# Patient Record
Sex: Male | Born: 1950 | Race: White | Hispanic: No | Marital: Married | Smoking: Never smoker
Health system: Southern US, Community
[De-identification: ages and names within clinical notes are randomized; demographics above are authoritative.]

## PROBLEM LIST (undated history)

## (undated) DIAGNOSIS — I82629 Acute embolism and thrombosis of deep veins of unspecified upper extremity: Secondary | ICD-10-CM

## (undated) DIAGNOSIS — I219 Acute myocardial infarction, unspecified: Secondary | ICD-10-CM

## (undated) DIAGNOSIS — I10 Essential (primary) hypertension: Secondary | ICD-10-CM

## (undated) DIAGNOSIS — Z950 Presence of cardiac pacemaker: Secondary | ICD-10-CM

## (undated) DIAGNOSIS — J189 Pneumonia, unspecified organism: Secondary | ICD-10-CM

## (undated) DIAGNOSIS — I639 Cerebral infarction, unspecified: Secondary | ICD-10-CM

## (undated) DIAGNOSIS — N189 Chronic kidney disease, unspecified: Secondary | ICD-10-CM

## (undated) DIAGNOSIS — I2510 Atherosclerotic heart disease of native coronary artery without angina pectoris: Secondary | ICD-10-CM

---

## 2012-05-19 DIAGNOSIS — Z1211 Encounter for screening for malignant neoplasm of colon: Secondary | ICD-10-CM | POA: Insufficient documentation

## 2013-05-11 DIAGNOSIS — I10 Essential (primary) hypertension: Secondary | ICD-10-CM | POA: Insufficient documentation

## 2013-05-11 DIAGNOSIS — E785 Hyperlipidemia, unspecified: Secondary | ICD-10-CM | POA: Insufficient documentation

## 2013-05-11 DIAGNOSIS — E1169 Type 2 diabetes mellitus with other specified complication: Secondary | ICD-10-CM | POA: Insufficient documentation

## 2013-05-11 DIAGNOSIS — R079 Chest pain, unspecified: Secondary | ICD-10-CM | POA: Insufficient documentation

## 2013-06-10 DIAGNOSIS — I341 Nonrheumatic mitral (valve) prolapse: Secondary | ICD-10-CM

## 2013-06-10 HISTORY — DX: Nonrheumatic mitral (valve) prolapse: I34.1

## 2015-05-10 DIAGNOSIS — M542 Cervicalgia: Secondary | ICD-10-CM | POA: Insufficient documentation

## 2015-05-10 DIAGNOSIS — M5412 Radiculopathy, cervical region: Secondary | ICD-10-CM | POA: Insufficient documentation

## 2015-05-10 DIAGNOSIS — G9520 Unspecified cord compression: Secondary | ICD-10-CM

## 2015-05-10 DIAGNOSIS — M5410 Radiculopathy, site unspecified: Secondary | ICD-10-CM | POA: Insufficient documentation

## 2015-05-10 DIAGNOSIS — R52 Pain, unspecified: Secondary | ICD-10-CM | POA: Insufficient documentation

## 2015-05-10 HISTORY — DX: Unspecified cord compression: G95.20

## 2015-05-10 HISTORY — DX: Radiculopathy, cervical region: M54.12

## 2015-05-12 DIAGNOSIS — G540 Brachial plexus disorders: Secondary | ICD-10-CM | POA: Insufficient documentation

## 2015-05-14 DIAGNOSIS — I495 Sick sinus syndrome: Secondary | ICD-10-CM | POA: Insufficient documentation

## 2015-05-14 DIAGNOSIS — G540 Brachial plexus disorders: Secondary | ICD-10-CM

## 2015-05-14 DIAGNOSIS — R001 Bradycardia, unspecified: Secondary | ICD-10-CM | POA: Insufficient documentation

## 2015-05-14 HISTORY — DX: Brachial plexus disorders: G54.0

## 2015-05-18 DIAGNOSIS — E1140 Type 2 diabetes mellitus with diabetic neuropathy, unspecified: Secondary | ICD-10-CM | POA: Insufficient documentation

## 2015-07-03 DIAGNOSIS — M7741 Metatarsalgia, right foot: Secondary | ICD-10-CM | POA: Insufficient documentation

## 2015-07-03 HISTORY — DX: Metatarsalgia, right foot: M77.41

## 2015-07-15 ENCOUNTER — Encounter (HOSPITAL_BASED_OUTPATIENT_CLINIC_OR_DEPARTMENT_OTHER): Payer: Self-pay | Admitting: Emergency Medicine

## 2015-07-15 ENCOUNTER — Emergency Department (HOSPITAL_BASED_OUTPATIENT_CLINIC_OR_DEPARTMENT_OTHER): Payer: Medicare HMO

## 2015-07-15 ENCOUNTER — Emergency Department (HOSPITAL_BASED_OUTPATIENT_CLINIC_OR_DEPARTMENT_OTHER)
Admission: EM | Admit: 2015-07-15 | Discharge: 2015-07-16 | Disposition: A | Discharge status: Home / Self Care | Payer: Medicare HMO | Attending: Emergency Medicine | Admitting: Emergency Medicine

## 2015-07-15 DIAGNOSIS — J40 Bronchitis, not specified as acute or chronic: Secondary | ICD-10-CM | POA: Diagnosis not present

## 2015-07-15 DIAGNOSIS — Z880 Allergy status to penicillin: Secondary | ICD-10-CM | POA: Insufficient documentation

## 2015-07-15 DIAGNOSIS — Z8701 Personal history of pneumonia (recurrent): Secondary | ICD-10-CM | POA: Insufficient documentation

## 2015-07-15 DIAGNOSIS — R0602 Shortness of breath: Secondary | ICD-10-CM | POA: Diagnosis present

## 2015-07-15 DIAGNOSIS — R079 Chest pain, unspecified: Secondary | ICD-10-CM | POA: Diagnosis not present

## 2015-07-15 HISTORY — DX: Pneumonia, unspecified organism: J18.9

## 2015-07-15 LAB — BASIC METABOLIC PANEL
Anion gap: 7 (ref 5–15)
BUN: 15 mg/dL (ref 6–20)
CO2: 25 mmol/L (ref 22–32)
CREATININE: 0.8 mg/dL (ref 0.61–1.24)
Calcium: 8.8 mg/dL — ABNORMAL LOW (ref 8.9–10.3)
Chloride: 104 mmol/L (ref 101–111)
Glucose, Bld: 187 mg/dL — ABNORMAL HIGH (ref 65–99)
POTASSIUM: 3.3 mmol/L — AB (ref 3.5–5.1)
Sodium: 136 mmol/L (ref 135–145)

## 2015-07-15 LAB — CBC
HEMATOCRIT: 36.4 % — AB (ref 39.0–52.0)
HEMOGLOBIN: 11.8 g/dL — AB (ref 13.0–17.0)
MCH: 27.9 pg (ref 26.0–34.0)
MCHC: 32.4 g/dL (ref 30.0–36.0)
MCV: 86.1 fL (ref 78.0–100.0)
Platelets: 197 10*3/uL (ref 150–400)
RBC: 4.23 MIL/uL (ref 4.22–5.81)
RDW: 13.6 % (ref 11.5–15.5)
WBC: 8.5 10*3/uL (ref 4.0–10.5)

## 2015-07-15 NOTE — ED Provider Notes (Signed)
CSN: [id number]     Arrival date & time 07/15/15  2037 History  By signing my name below, I, Terrance Branch, attest that this documentation has been prepared under the direction and in the presence of Charlesetta Shanks, MD. Electronically Signed: Randa Evens, ED Scribe. 07/15/2015. 11:50 PM.      Chief Complaint  Patient presents with  . Shortness of Breath   HPI HPI Comments: Jason Jarvis is a 64 y.o. male who presents to the Emergency Department complaining of worsening cough onset 2 days prior. No sputum production. No fever. Pt states that he has associated CP and SOB. Chest pain is at the left lower chest wall and made worse by cough. No lower extremity swelling or calf pain.  Pt reports Hx of pneumonia and reports that he had similar symptoms. Reports ICU admit for 5 days for bradycardia. Pt doesn't report any alleviating or worsening factors. Pt doesn't report any other symptoms. Pt denies Hx of tobacco use.    Past Medical History  Diagnosis Date  . Pneumonia    History reviewed. No pertinent past surgical history. History reviewed. No pertinent family history. Social History  Substance Use Topics  . Smoking status: Never Smoker   . Smokeless tobacco: None  . Alcohol Use: No    Review of Systems 10 Systems reviewed and all are negative for acute change except as noted in the HPI.   Allergies  Ace inhibitors; Penicillins; Prednisone; and Sulfa antibiotics  Home Medications   Prior to Admission medications   Not on File   BP 154/75 mmHg  Pulse 60  Temp(Src) 99.4 F (37.4 C) (Oral)  Resp 18  Ht 5\' 9"  (1.753 m)  Wt 210 lb (95.255 kg)  BMI 31.00 kg/m2  SpO2 96%   Physical Exam  Constitutional: He is oriented to person, place, and time. He appears well-developed and well-nourished.  HENT:  Head: Normocephalic and atraumatic.  Eyes: EOM are normal. Pupils are equal, round, and reactive to light.  Neck: Neck supple.  Cardiovascular: Normal rate, regular  rhythm, normal heart sounds and intact distal pulses.   Pulmonary/Chest: Effort normal and breath sounds normal. He exhibits tenderness.  Patient endorses tenderness to palpation along the left chest wall. There is no crepitus, no rash, no soft tissue abnormality.  Abdominal: Soft. Bowel sounds are normal. He exhibits no distension. There is no tenderness.  Musculoskeletal: Normal range of motion. He exhibits no edema.  Neurological: He is alert and oriented to person, place, and time. He has normal strength. Coordination normal. GCS eye subscore is 4. GCS verbal subscore is 5. GCS motor subscore is 6.  Skin: Skin is warm, dry and intact.  Psychiatric: He has a normal mood and affect.    ED Course  Procedures (including critical care time) DIAGNOSTIC STUDIES: Oxygen Saturation is 100% on RA, normal by my interpretation.    COORDINATION OF CARE: 10:11 PM-Discussed treatment plan with pt at bedside and pt agreed to plan.     Labs Review Labs Reviewed  BASIC METABOLIC PANEL - Abnormal; Notable for the following:    Potassium 3.3 (*)    Glucose, Bld 187 (*)    Calcium 8.8 (*)    All other components within normal limits  CBC - Abnormal; Notable for the following:    Hemoglobin 11.8 (*)    HCT 36.4 (*)    All other components within normal limits    Imaging Review Dg Chest 2 View  07/15/2015  CLINICAL DATA:  Cough and shortness of breath for a few days. EXAM: CHEST  2 VIEW COMPARISON:  None. FINDINGS: Lung volumes are low. Heart size is normal for technique. Mild tortuosity of the thoracic aorta. No consolidation to suggest pneumonia. Minimal left basilar atelectasis. Probable calcified granuloma projecting over the right upper lung. No pleural effusion or pneumothorax. No acute osseous abnormalities are seen. IMPRESSION: Low lung volumes with mild left basilar atelectasis. No consolidation to suggest pneumonia. Electronically Signed   By: Jeb Levering M.D.   On: 07/15/2015 21:33       EKG Interpretation   Date/Time:  Sunday July 15 2015 21:59:38 EST Ventricular Rate:  54 PR Interval:  190 QRS Duration: 106 QT Interval:  436 QTC Calculation: 413 R Axis:   -56 Text Interpretation:  Sinus bradycardia Incomplete right bundle branch  block Left anterior fascicular block Minimal voltage criteria for LVH, may  be normal variant Nonspecific T wave abnormality Abnormal ECG Confirmed by  Johnney Killian, MD, Jeannie Done [phone]) on 07/15/2015 10:14:01 PM      MDM   Final diagnoses:  Bronchitis   Patient present with concerns for pneumonia due to a prior episode of pneumonia and hospitalization in June. At this time there do not appear to be focal pneumonia symptoms. The patient is afebrile and does not have leukocytosis. Is no local consolidation on chest x-ray. I was able to review the EMR from [facility] and the patient had a essentially normal cardiac catheterization in May. He has as well an echo with normal EF. At this time, with prior diagnostic evaluations, the patient is of low probability to have acute ischemic or congestive heart failure as the etiology of his symptoms. Consideration is given to pneumonia, however without concrete findings, I did not feel that empiracally starting the patient antibiotics was appropriate. Particularly as the patient had a significant problem with bradycardia during his hospitalization. His wife reports ultimately they thought this was due to his amlodipine.       Charlesetta Shanks, MD 07/15/15 [phone]

## 2015-07-15 NOTE — ED Notes (Signed)
Patient states that he is having a lot of SOB and cough. Reports history of PNA in June. Patient reports that he is having  Hard time catching his breath.

## 2015-07-15 NOTE — Discharge Instructions (Signed)

## 2015-08-21 DIAGNOSIS — B351 Tinea unguium: Secondary | ICD-10-CM | POA: Insufficient documentation

## 2015-08-21 DIAGNOSIS — M7742 Metatarsalgia, left foot: Secondary | ICD-10-CM | POA: Insufficient documentation

## 2015-08-21 HISTORY — DX: Metatarsalgia, left foot: M77.42

## 2015-11-01 ENCOUNTER — Other Ambulatory Visit: Payer: Self-pay | Admitting: Urology

## 2015-11-01 DIAGNOSIS — N2889 Other specified disorders of kidney and ureter: Secondary | ICD-10-CM

## 2015-11-08 ENCOUNTER — Ambulatory Visit
Admission: RE | Admit: 2015-11-08 | Discharge: 2015-11-08 | Disposition: A | Discharge status: Home / Self Care | Payer: Medicare HMO | Source: Ambulatory Visit | Attending: Urology | Admitting: Urology

## 2015-11-08 DIAGNOSIS — N2889 Other specified disorders of kidney and ureter: Secondary | ICD-10-CM

## 2015-11-08 HISTORY — DX: Cerebral infarction, unspecified: I63.9

## 2015-11-08 HISTORY — DX: Atherosclerotic heart disease of native coronary artery without angina pectoris: I25.10

## 2015-11-08 HISTORY — DX: Chronic kidney disease, unspecified: N18.9

## 2015-11-08 HISTORY — DX: Acute myocardial infarction, unspecified: I21.9

## 2015-11-08 HISTORY — DX: Essential (primary) hypertension: I10

## 2015-11-08 NOTE — Consult Note (Signed)
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 Assess for ablation.  Referring Physician(s): Eskew,Lawrence A   History of Present Illness: Jason Jarvis is a 65 y.o. male who presents for evaluation of a right renal mass incidentally found. He remains asymptomatic. No current abdominal pain or flank pain. No gross hematuria. Lesion was found on a lumbar spine MRI for back pain. This is followed by an ultrasound which demonstrated a solid lesion. This prompted a CT scan which confirms a solid enhancing renal mass compatible with a renal cell carcinoma. He presents for outpatient evaluation for percutaneous image guided cryoablation. He has multiple comorbidities including hypertension, mild chronic renal insufficiency, remote stroke and heart attack. Known coronary disease.  Past Medical History  Diagnosis Date  . Pneumonia   . Hypertension   . Chronic kidney disease   . Stroke ([place])     approx 15 yrs ago  . Myocardial infarction [facility])     pt states that he had a "light heart attack about 25 yrs ago"  . Coronary artery disease     No past surgical history on file.  Allergies: Ace inhibitors; Penicillins; Prednisone; and Sulfa antibiotics  Medications: Prior to Admission medications   Medication Sig Start Date End Date Taking? Authorizing Provider  aspirin EC 81 MG tablet Take by mouth daily.   Yes Historical Provider, MD  atorvastatin (LIPITOR) 40 MG tablet Take 40 mg by mouth daily.   Yes Historical Provider, MD  carisoprodol (SOMA) 350 MG tablet Take 350 mg by mouth 4 (four) times daily as needed for muscle spasms.   Yes Historical Provider, MD  doxazosin (CARDURA) 2 MG tablet Take 2 mg by mouth at bedtime.   Yes Historical Provider, MD  furosemide (LASIX) 80 MG tablet Take 80 mg by mouth. Take 1 tablet every AM. Take 1/2 tablet every PM   Yes Historical Provider, MD  gabapentin (NEURONTIN) 600 MG tablet Take 600 mg by mouth 3 (three) times daily.   Yes Historical  Provider, MD  hydrOXYzine (ATARAX/VISTARIL) 10 MG tablet Take 10 mg by mouth 3 (three) times daily as needed.   Yes Historical Provider, MD  insulin aspart (NOVOLOG) 100 UNIT/ML injection Inject 15 Units into the skin every evening.   Yes Historical Provider, MD  Insulin Aspart Prot & Aspart (NOVOLOG MIX 70/30 [place]) Inject 30 Units into the skin every morning.   Yes Historical Provider, MD  IRON PO Take 65 mg by mouth daily.   Yes Historical Provider, MD  losartan (COZAAR) 100 MG tablet Take 100 mg by mouth daily.   Yes Historical Provider, MD  Multiple Vitamins-Minerals (CENTRUM SILVER ADULT 50+ PO) Take 1 tablet by mouth daily.   Yes Historical Provider, MD  nitroGLYCERIN (NITROSTAT) 0.4 MG SL tablet Place 0.4 mg under the tongue every 5 (five) minutes as needed for chest pain.   Yes Historical Provider, MD  potassium chloride (K-DUR) 10 MEQ tablet Take 10 mEq by mouth daily.   Yes Historical Provider, MD     No family history on file.  Social History   Social History  . Marital Status: Married    Spouse Name: N/A  . Number of Children: N/A  . Years of Education: N/A   Social History Main Topics  . Smoking status: Never Smoker   . Smokeless tobacco: Never Used  . Alcohol Use: No  . Drug Use: No  . Sexual Activity: Not on file   Other Topics Concern  . Not  on file   Social History Narrative    ECOG Status: 0 - Asymptomatic  Review of Systems: A 12 point ROS discussed and pertinent positives are indicated in the HPI above.  All other systems are negative.  Review of Systems  Constitutional: Negative for fever, diaphoresis, activity change, appetite change, fatigue and unexpected weight change.  Respiratory: Negative for cough, chest tightness and shortness of breath.   Cardiovascular: Negative for chest pain.  Gastrointestinal: Negative for abdominal pain and abdominal distention.  Genitourinary: Negative for hematuria and flank pain.    Vital Signs: BP 160/67 mmHg   Pulse 50  Temp(Src) 98.2 F (36.8 C) (Oral)  Resp 14  Ht 5\' 9"  (1.753 m)  Wt 218 lb (98.884 kg)  BMI 32.18 kg/m2  SpO2 99%  Physical Exam  Constitutional: He appears well-developed and well-nourished. No distress.  Cardiovascular: Normal rate and regular rhythm.   No murmur heard. Pulmonary/Chest: Effort normal and breath sounds normal. No respiratory distress. He has no wheezes.  Abdominal: Soft. Bowel sounds are normal. He exhibits no distension and no mass.  Protuberant obese abdomen.  Skin: He is not diaphoretic.    Mallampati Score: 2  Imaging: CT imaging without and with contrast performed at the [facility] physicians urology office demonstrates a 2.4 cm right mid pole posterior enhancing renal mass compatible with a renal cell carcinoma. No evidence of renal vein invasion or metastatic process. No adenopathy.  Labs:  CBC:  Recent Labs  07/15/15 2125  WBC 8.5  HGB 11.8*  HCT 36.4*  PLT 197    COAGS: No results for input(s): INR, APTT in the last 8760 hours.  BMP:  Recent Labs  07/15/15 2125  NA 136  K 3.3*  CL 104  CO2 25  GLUCOSE 187*  BUN 15  CALCIUM 8.8*  CREATININE 0.80  GFRNONAA >60  GFRAA >60    LIVER FUNCTION TESTS: No results for input(s): BILITOT, AST, ALT, ALKPHOS, PROT, ALBUMIN in the last 8760 hours.  TUMOR MARKERS: No results for input(s): AFPTM, CEA, CA199, CHROMGRNA in the last 8760 hours.  Assessment and Plan:  2.4 cm posterior right renal cell carcinoma. Lesion size and location is amenable to percutaneous CT-guided cryoablation under general anesthesia. The procedure was discussed in detail with the patient and his family member. They have a clear understanding of the cryoablation procedure, risks, benefits and alternatives. After our discussion and answering his questions, he would like to proceed with scheduling the procedure electively at [facility].   Prior to the procedure he will need cardiac  clearance. This will be scheduled as an outpatient.  Thank you for this interesting consult.  I greatly enjoyed meeting [place] and look forward to participating in their care.  A copy of this report was sent to the requesting provider on this date.  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

## 2015-12-07 ENCOUNTER — Other Ambulatory Visit: Payer: Self-pay | Admitting: Physician Assistant

## 2015-12-07 DIAGNOSIS — C641 Malignant neoplasm of right kidney, except renal pelvis: Secondary | ICD-10-CM

## 2015-12-11 ENCOUNTER — Other Ambulatory Visit: Payer: Self-pay | Admitting: *Deleted

## 2015-12-11 DIAGNOSIS — C641 Malignant neoplasm of right kidney, except renal pelvis: Secondary | ICD-10-CM

## 2016-01-02 ENCOUNTER — Ambulatory Visit
Admission: RE | Admit: 2016-01-02 | Discharge: 2016-01-02 | Disposition: A | Discharge status: Home / Self Care | Payer: Medicare HMO | Source: Ambulatory Visit | Attending: Physician Assistant | Admitting: Physician Assistant

## 2016-01-02 DIAGNOSIS — C641 Malignant neoplasm of right kidney, except renal pelvis: Secondary | ICD-10-CM

## 2016-01-02 NOTE — Progress Notes (Signed)
Patient ID: Jason Jarvis, male   DOB: May 06, 1951, 65 y.o.   MRN: [id number]       Chief Complaint:  1 month status post right renal cell carcinoma ablation.  Referring Physician(s): Eskew  History of Present Illness: Jason Jarvis is a 65 y.o. 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 This was performed at [facility] hospital 12/06/2015. He has recovered at home over the last month very well. No current flank or abdominal pain. No hematuria. His lesion was found incidentally on a lumbar spine MRI for other symptoms. Overall he has done very well and is back to his usual state of health. No physical limitations. He continues to work.  Past Medical History  Diagnosis Date  . Pneumonia   . Hypertension   . Chronic kidney disease   . Stroke ([place])     approx 15 yrs ago  . Myocardial infarction [facility])     pt states that he had a "light heart attack about 25 yrs ago"  . Coronary artery disease     No past surgical history on file.  Allergies: Ace inhibitors; Penicillins; Prednisone; and Sulfa antibiotics  Medications: Prior to Admission medications   Medication Sig Start Date End Date Taking? Authorizing Provider  aspirin EC 81 MG tablet Take by mouth daily.   Yes Historical Provider, MD  atorvastatin (LIPITOR) 40 MG tablet Take 40 mg by mouth daily.   Yes Historical Provider, MD  doxazosin (CARDURA) 2 MG tablet Take 2 mg by mouth at bedtime.   Yes Historical Provider, MD  furosemide (LASIX) 80 MG tablet Take 80 mg by mouth. Take 1 tablet every AM. Take 1/2 tablet every PM   Yes Historical Provider, MD  gabapentin (NEURONTIN) 600 MG tablet Take 600 mg by mouth 3 (three) times daily.   Yes Historical Provider, MD  hydrOXYzine (ATARAX/VISTARIL) 10 MG tablet Take 10 mg by mouth 3 (three) times daily as needed.   Yes Historical Provider, MD  insulin aspart (NOVOLOG) 100 UNIT/ML injection Inject 15 Units into the skin every evening.    Yes Historical Provider, MD  Insulin Aspart Prot & Aspart (NOVOLOG MIX 70/30 [place]) Inject 30 Units into the skin every morning.   Yes Historical Provider, MD  IRON PO Take 65 mg by mouth daily.   Yes Historical Provider, MD  losartan (COZAAR) 100 MG tablet Take 100 mg by mouth daily.   Yes Historical Provider, MD  Multiple Vitamins-Minerals (CENTRUM SILVER ADULT 50+ PO) Take 1 tablet by mouth daily.   Yes Historical Provider, MD  nitroGLYCERIN (NITROSTAT) 0.4 MG SL tablet Place 0.4 mg under the tongue every 5 (five) minutes as needed for chest pain.   Yes Historical Provider, MD  potassium chloride (K-DUR) 10 MEQ tablet Take 10 mEq by mouth daily.   Yes Historical Provider, MD  carisoprodol (SOMA) 350 MG tablet Take 350 mg by mouth 4 (four) times daily as needed for muscle spasms. Reported on 01/02/2016    Historical Provider, MD     No family history on file.  Social History   Social History  . Marital Status: Married    Spouse Name: N/A  . Number of Children: N/A  . Years of Education: N/A   Social History Main Topics  . Smoking status: Never Smoker   . Smokeless tobacco: Never Used  . Alcohol Use: No  . Drug Use: No  . Sexual Activity: Not on file  Other Topics Concern  . Not on file   Social History Narrative    ECOG Status: 0 - Asymptomatic  Review of Systems: A 12 point ROS discussed and pertinent positives are indicated in the HPI above.  All other systems are negative.  Review of Systems  Vital Signs: BP 140/61 mmHg  Pulse 59  Temp(Src) 98.5 F (36.9 C) (Oral)  Resp 15  SpO2 98%  Physical Exam  Constitutional: He appears well-developed and well-nourished. No distress.  Cardiovascular: Normal rate and regular rhythm.  Exam reveals no friction rub.   No murmur heard. Pulmonary/Chest: Effort normal and breath sounds normal. No respiratory distress.  Abdominal: Soft. Bowel sounds are normal. He exhibits no distension and no mass. There is no tenderness. No  hernia.  Skin: Skin is warm and dry. No rash noted. He is not diaphoretic. No erythema.  Right flank puncture sites are well-healed.  Psychiatric: He has a normal mood and affect. His behavior is normal.    Imaging: No results found.  Labs:  CBC:  Recent Labs  07/15/15 2125  WBC 8.5  HGB 11.8*  HCT 36.4*  PLT 197    COAGS: No results for input(s): INR, APTT in the last 8760 hours.  BMP:  Recent Labs  07/15/15 2125  NA 136  K 3.3*  CL 104  CO2 25  GLUCOSE 187*  BUN 15  CALCIUM 8.8*  CREATININE 0.80  GFRNONAA >60  GFRAA >60    LIVER FUNCTION TESTS: No results for input(s): BILITOT, AST, ALT, ALKPHOS, PROT, ALBUMIN in the last 8760 hours.  TUMOR MARKERS: No results for input(s): AFPTM, CEA, CA199, CHROMGRNA in the last 8760 hours.  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 Procedure performed at [facility] hospital 12/06/2015. He has recovered at home very well. No flank or abdominal pain. No hematuria or difficulty urinating. Overall he is doing very well. No physical limitations. He continues to work.  Plan: Schedule for post ablation surveillance CT imaging in 3 months at [facility] urology outpatient center. I will see him back in the office to review the imaging.  Electronically Signed: Greggory Keen 01/02/2016, 3:25 PM   I spent a total of    15 Minutes in face to face in clinical consultation, greater than 50% of which was counseling/coordinating care for This patient status post renal cell carcinoma cryoablation.

## 2016-02-25 DIAGNOSIS — L03039 Cellulitis of unspecified toe: Secondary | ICD-10-CM

## 2016-02-25 HISTORY — DX: Cellulitis of unspecified toe: L03.039

## 2016-03-03 DIAGNOSIS — E1142 Type 2 diabetes mellitus with diabetic polyneuropathy: Secondary | ICD-10-CM | POA: Insufficient documentation

## 2016-03-03 DIAGNOSIS — L84 Corns and callosities: Secondary | ICD-10-CM | POA: Insufficient documentation

## 2016-03-26 ENCOUNTER — Other Ambulatory Visit: Payer: Self-pay | Admitting: *Deleted

## 2016-03-26 DIAGNOSIS — C641 Malignant neoplasm of right kidney, except renal pelvis: Secondary | ICD-10-CM

## 2016-03-28 ENCOUNTER — Other Ambulatory Visit: Payer: Self-pay | Admitting: *Deleted

## 2016-03-28 DIAGNOSIS — C641 Malignant neoplasm of right kidney, except renal pelvis: Secondary | ICD-10-CM

## 2016-03-31 DIAGNOSIS — N2889 Other specified disorders of kidney and ureter: Secondary | ICD-10-CM | POA: Insufficient documentation

## 2016-03-31 HISTORY — DX: Other specified disorders of kidney and ureter: N28.89

## 2016-04-02 ENCOUNTER — Other Ambulatory Visit: Payer: Self-pay | Admitting: Interventional Radiology

## 2016-04-02 DIAGNOSIS — C641 Malignant neoplasm of right kidney, except renal pelvis: Secondary | ICD-10-CM

## 2016-04-22 ENCOUNTER — Ambulatory Visit
Admission: RE | Admit: 2016-04-22 | Discharge: 2016-04-22 | Disposition: A | Discharge status: Home / Self Care | Payer: Medicare HMO | Source: Ambulatory Visit | Attending: Interventional Radiology | Admitting: Interventional Radiology

## 2016-04-22 DIAGNOSIS — C641 Malignant neoplasm of right kidney, except renal pelvis: Secondary | ICD-10-CM

## 2016-04-22 DIAGNOSIS — E7800 Pure hypercholesterolemia, unspecified: Secondary | ICD-10-CM

## 2016-04-22 HISTORY — PX: IR GENERIC HISTORICAL: IMG1180011

## 2016-04-22 HISTORY — DX: Pure hypercholesterolemia, unspecified: E78.00

## 2016-04-22 NOTE — Progress Notes (Signed)
Patient states he has been having anorexia, nausea, vomiting and diarrhea for the past month.  Dr. Annamaria Boots aware.  Brita Romp, RN

## 2016-04-22 NOTE — Progress Notes (Signed)
Patient ID: Jason Jarvis, male   DOB: [birth year], 65 y.o.   MRN: [id number]       Chief Complaint: Right renal cell carcinoma, status post cryoablation, outpatient follow-up  Referring Physician(s): Eskew  History of Present Illness: Jason Jarvis is a 65 y.o. 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 Procedure performed 12/06/2015 at [facility].  he has recovered very well. 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 No hematuria. Overall he is doing very well. He is back to his baseline. No current physical limitations. Stable weight. He returns to review surveillance imaging.  Past Medical History:  Diagnosis Date  . Chronic kidney disease   . Coronary artery disease   . Hypertension   . Myocardial infarction [facility])    pt states that he had a "light heart attack about 25 yrs ago"  . Pneumonia   . Stroke ([place])    approx 15 yrs ago    No past surgical history on file.  Allergies: Cephalexin; Lorazepam; Penicillins; Ace inhibitors; Prednisone; and Sulfa antibiotics  Medications: Prior to Admission medications   Medication Sig Start Date End Date Taking? Authorizing Provider  amLODipine (NORVASC) 2.5 MG tablet  01/23/16  Yes Historical Provider, MD  aspirin EC 81 MG tablet Take by mouth daily.   Yes Historical Provider, MD  atorvastatin (LIPITOR) 40 MG tablet Take 40 mg by mouth daily.   Yes Historical Provider, MD  carisoprodol (SOMA) 350 MG tablet Take 350 mg by mouth 4 (four) times daily as needed for muscle spasms. Reported on 01/02/2016   Yes Historical Provider, MD  ciclopirox (LOPROX) 0.77 % cream  11/20/15  Yes Historical Provider, MD  doxazosin (CARDURA) 2 MG tablet Take 2 mg by mouth at bedtime.   Yes Historical Provider, MD  furosemide (LASIX) 80 MG tablet Take 80 mg by mouth. Take 1 tablet every AM. Take 1/2 tablet every PM   Yes Historical Provider, MD  gabapentin  (NEURONTIN) 600 MG tablet Take 600 mg by mouth 3 (three) times daily.   Yes Historical Provider, MD  hydrOXYzine (ATARAX/VISTARIL) 10 MG tablet Take 10 mg by mouth 3 (three) times daily as needed.   Yes Historical Provider, MD  insulin aspart (NOVOLOG) 100 UNIT/ML injection Inject 15 Units into the skin every evening.   Yes Historical Provider, MD  Insulin Aspart Prot & Aspart (NOVOLOG MIX 70/30 [place]) Inject 30 Units into the skin every morning.   Yes Historical Provider, MD  IRON PO Take 65 mg by mouth daily.   Yes Historical Provider, MD  losartan (COZAAR) 100 MG tablet Take 100 mg by mouth daily.   Yes Historical Provider, MD  Multiple Vitamins-Minerals (CENTRUM SILVER ADULT 50+ PO) Take 1 tablet by mouth daily.   Yes Historical Provider, MD  nitroGLYCERIN (NITROSTAT) 0.4 MG SL tablet Place 0.4 mg under the tongue every 5 (five) minutes as needed for chest pain.   Yes Historical Provider, MD  potassium chloride (K-DUR) 10 MEQ tablet Take 10 mEq by mouth daily.   Yes Historical Provider, MD     No family history on file.  Social History   Social History  . Marital status: Married    Spouse name: N/A  . Number of children: N/A  . Years of education: N/A   Social History Main Topics  . Smoking status: Never Smoker  . Smokeless tobacco: Never Used  . Alcohol use No  .  Drug use: No  . Sexual activity: Not Asked   Other Topics Concern  . None   Social History Narrative  . None    ECOG Status: 1 - Symptomatic but completely ambulatory  Review of Systems: A 12 point ROS discussed and pertinent positives are indicated in the HPI above.  All other systems are negative.  Review of Systems  Vital Signs: BP 137/62 (BP Location: Right Arm, Patient Position: Sitting, Cuff Size: Normal)   Pulse 64   Temp 98 F (36.7 C)   Resp 18   Ht 5\' 9"  (1.753 m)   Wt 217 lb 3.2 oz (98.5 kg) Comment: with bib overalls and shoes on  SpO2 97%   BMI 32.07 kg/m   Physical Exam  Constitutional:  He is oriented to person, place, and time. He appears well-developed and well-nourished.  Eyes: Conjunctivae are normal. No scleral icterus.  Cardiovascular: Normal rate and regular rhythm.   No murmur heard. Pulmonary/Chest: Effort normal. No respiratory distress.  Abdominal: Soft. Bowel sounds are normal. He exhibits no distension and no mass. There is no guarding. No hernia.  Neurological: He is alert and oriented to person, place, and time.  Skin: Skin is warm and dry. No rash noted. He is not diaphoretic.  Right flank incision sites well-healed.  Psychiatric: He has a normal mood and affect. His behavior is normal.   Imaging: Surveillance imaging performed in [facility] 04/15/2016. This demonstrates a nonenhancing right renal ablation defect following the treatment. No abnormal enhancement to suggest residual or viable tumor at the treatment site. No delay complication.  Labs:  CBC:  Recent Labs  07/15/15 2125  WBC 8.5  HGB 11.8*  HCT 36.4*  PLT 197    COAGS: No results for input(s): INR, APTT in the last 8760 hours.  BMP:  Recent Labs  07/15/15 2125  NA 136  K 3.3*  CL 104  CO2 25  GLUCOSE 187*  BUN 15  CALCIUM 8.8*  CREATININE 0.80  GFRNONAA >60  GFRAA >60    LIVER FUNCTION TESTS: No results for input(s): BILITOT, AST, ALT, ALKPHOS, PROT, ALBUMIN in the last 8760 hours.  TUMOR MARKERS: No results for input(s): AFPTM, CEA, CA199, CHROMGRNA in the last 8760 hours.   Assessment and Plan:  4 months status post right renal cell carcinoma image guided cryoablation at [facility]. Initial surveillance imaging demonstrates stable ablation defect without residual tumor or enhancement. No new findings or complication. Overall he is doing very well  Plan: Repeat outpatient surveillance imaging with CT without and with contrast in 6 months.  Thank you for this interesting consult.  I greatly enjoyed meeting [place] and look forward to participating in their  care.  A copy of this report was sent to the requesting provider on this date.  Electronically Signed: Greggory Keen 04/22/2016, 3:11 PM   I spent a total of    25 Minutes in face to face in clinical consultation, greater than 50% of which was counseling/coordinating care for with a right renal cell carcinoma.

## 2016-09-11 ENCOUNTER — Encounter: Payer: Self-pay | Admitting: Interventional Radiology

## 2016-12-13 DIAGNOSIS — M1711 Unilateral primary osteoarthritis, right knee: Secondary | ICD-10-CM | POA: Insufficient documentation

## 2016-12-18 DIAGNOSIS — L84 Corns and callosities: Secondary | ICD-10-CM | POA: Insufficient documentation

## 2017-05-09 HISTORY — PX: PACEMAKER IMPLANT: EP1218

## 2017-06-18 DIAGNOSIS — T827XXA Infection and inflammatory reaction due to other cardiac and vascular devices, implants and grafts, initial encounter: Secondary | ICD-10-CM

## 2017-06-18 DIAGNOSIS — B9562 Methicillin resistant Staphylococcus aureus infection as the cause of diseases classified elsewhere: Secondary | ICD-10-CM

## 2017-06-18 HISTORY — DX: Methicillin resistant Staphylococcus aureus infection as the cause of diseases classified elsewhere: B95.62

## 2017-07-09 HISTORY — PX: PACEMAKER REMOVAL: SHX5066

## 2017-07-29 DIAGNOSIS — D500 Iron deficiency anemia secondary to blood loss (chronic): Secondary | ICD-10-CM

## 2017-07-29 HISTORY — DX: Iron deficiency anemia secondary to blood loss (chronic): D50.0

## 2017-09-09 ENCOUNTER — Other Ambulatory Visit: Payer: Self-pay

## 2017-09-09 ENCOUNTER — Emergency Department (HOSPITAL_BASED_OUTPATIENT_CLINIC_OR_DEPARTMENT_OTHER): Payer: Medicare HMO

## 2017-09-09 ENCOUNTER — Encounter (HOSPITAL_BASED_OUTPATIENT_CLINIC_OR_DEPARTMENT_OTHER): Payer: Self-pay | Admitting: Emergency Medicine

## 2017-09-09 ENCOUNTER — Emergency Department (HOSPITAL_BASED_OUTPATIENT_CLINIC_OR_DEPARTMENT_OTHER)
Admission: EM | Admit: 2017-09-09 | Discharge: 2017-09-09 | Disposition: A | Discharge status: Home / Self Care | Payer: Medicare HMO | Attending: Emergency Medicine | Admitting: Emergency Medicine

## 2017-09-09 DIAGNOSIS — M79601 Pain in right arm: Secondary | ICD-10-CM | POA: Diagnosis present

## 2017-09-09 DIAGNOSIS — R52 Pain, unspecified: Secondary | ICD-10-CM

## 2017-09-09 DIAGNOSIS — I252 Old myocardial infarction: Secondary | ICD-10-CM | POA: Insufficient documentation

## 2017-09-09 DIAGNOSIS — E1122 Type 2 diabetes mellitus with diabetic chronic kidney disease: Secondary | ICD-10-CM | POA: Diagnosis not present

## 2017-09-09 DIAGNOSIS — I2510 Atherosclerotic heart disease of native coronary artery without angina pectoris: Secondary | ICD-10-CM | POA: Diagnosis not present

## 2017-09-09 DIAGNOSIS — Z7982 Long term (current) use of aspirin: Secondary | ICD-10-CM | POA: Insufficient documentation

## 2017-09-09 DIAGNOSIS — Z8673 Personal history of transient ischemic attack (TIA), and cerebral infarction without residual deficits: Secondary | ICD-10-CM | POA: Diagnosis not present

## 2017-09-09 DIAGNOSIS — N189 Chronic kidney disease, unspecified: Secondary | ICD-10-CM | POA: Insufficient documentation

## 2017-09-09 DIAGNOSIS — I129 Hypertensive chronic kidney disease with stage 1 through stage 4 chronic kidney disease, or unspecified chronic kidney disease: Secondary | ICD-10-CM | POA: Insufficient documentation

## 2017-09-09 DIAGNOSIS — I82621 Acute embolism and thrombosis of deep veins of right upper extremity: Secondary | ICD-10-CM | POA: Insufficient documentation

## 2017-09-09 DIAGNOSIS — Z794 Long term (current) use of insulin: Secondary | ICD-10-CM | POA: Diagnosis not present

## 2017-09-09 DIAGNOSIS — Z79899 Other long term (current) drug therapy: Secondary | ICD-10-CM | POA: Diagnosis not present

## 2017-09-09 HISTORY — DX: Acute embolism and thrombosis of deep veins of unspecified upper extremity: I82.629

## 2017-09-09 LAB — CBC WITH DIFFERENTIAL/PLATELET
Basophils Absolute: 0 10*3/uL (ref 0.0–0.1)
Basophils Relative: 0 %
Eosinophils Absolute: 0.1 10*3/uL (ref 0.0–0.7)
Eosinophils Relative: 2 %
HCT: 32.7 % — ABNORMAL LOW (ref 39.0–52.0)
Hemoglobin: 10.3 g/dL — ABNORMAL LOW (ref 13.0–17.0)
Lymphocytes Relative: 19 %
Lymphs Abs: 1.3 10*3/uL (ref 0.7–4.0)
MCH: 25.8 pg — ABNORMAL LOW (ref 26.0–34.0)
MCHC: 31.5 g/dL (ref 30.0–36.0)
MCV: 82 fL (ref 78.0–100.0)
Monocytes Absolute: 0.5 10*3/uL (ref 0.1–1.0)
Monocytes Relative: 8 %
Neutro Abs: 5 10*3/uL (ref 1.7–7.7)
Neutrophils Relative %: 71 %
Platelets: 234 10*3/uL (ref 150–400)
RBC: 3.99 MIL/uL — ABNORMAL LOW (ref 4.22–5.81)
RDW: 15 % (ref 11.5–15.5)
WBC: 7 10*3/uL (ref 4.0–10.5)

## 2017-09-09 LAB — SEDIMENTATION RATE: Sed Rate: 17 mm/hr — ABNORMAL HIGH (ref 0–16)

## 2017-09-09 LAB — BASIC METABOLIC PANEL
Anion gap: 10 (ref 5–15)
BUN: 17 mg/dL (ref 6–20)
CO2: 28 mmol/L (ref 22–32)
Calcium: 9.2 mg/dL (ref 8.9–10.3)
Chloride: 102 mmol/L (ref 101–111)
Creatinine, Ser: 1.39 mg/dL — ABNORMAL HIGH (ref 0.61–1.24)
GFR calc Af Amer: 59 mL/min — ABNORMAL LOW (ref >60)
GFR calc non Af Amer: 51 mL/min — ABNORMAL LOW (ref >60)
Glucose, Bld: 128 mg/dL — ABNORMAL HIGH (ref 65–99)
Potassium: 3.7 mmol/L (ref 3.5–5.1)
Sodium: 140 mmol/L (ref 135–145)

## 2017-09-09 MED ORDER — HYDROMORPHONE HCL 1 MG/ML IJ SOLN
1.0000 mg | Freq: Once | INTRAMUSCULAR | Status: AC
  Administered 2017-09-09: 1 mg via INTRAVENOUS
  Filled 2017-09-09: qty 1

## 2017-09-09 MED ORDER — RIVAROXABAN (XARELTO) VTE STARTER PACK (15 & 20 MG): ORAL_TABLET | ORAL | 0 refills | Status: DC

## 2017-09-09 MED ORDER — HYDROCODONE-ACETAMINOPHEN 5-325 MG PO TABS: 1.0000 | ORAL_TABLET | ORAL | 0 refills | Status: DC | PRN

## 2017-09-09 MED ORDER — RIVAROXABAN 15 MG PO TABS
15.0000 mg | ORAL_TABLET | Freq: Once | ORAL | Status: AC
  Administered 2017-09-09: 15 mg via ORAL
  Filled 2017-09-09: qty 1

## 2017-09-09 MED FILL — HYDROCODON-APAP 5-325: 5-325 | 2 days supply | Qty: 12 | Fill #0

## 2017-09-09 MED FILL — XARELTO STARTER PACK: 15 & 20 | 28 days supply | Qty: 51 | Fill #0

## 2017-09-09 NOTE — ED Triage Notes (Signed)
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  States that patient had PICC line in right arm for IV abx which was removed on 12/25.  Patient now has right arm pain.  Patient also had 2 falls which he is thinks could also attribute to right arm pain.

## 2017-09-09 NOTE — ED Provider Notes (Addendum)
[facility] EMERGENCY DEPARTMENT Provider Note   CSN: [id number] Arrival date & time: 09/09/17  0715     History   Chief Complaint Chief Complaint  Patient presents with  . Arm Pain    HPI Jason Jarvis is a 67 y.o. male.  HPI   67 year old male with right upper extremity pain.  Patient with complicated recent history.  Pacemaker was placed in September and subsequently removed because of infection in November.  He had a PICC in his right arm and getting vanc through Christmas day when it was then removed.  Since around that time he has had increasing pain from the shoulder all the way down to his hand.  The pain is diffuse but seems focally worse at the wrist and the shoulder.  Constant.  Pain is worse when he raises his arm above his head and when has hand hangs down at his side.  He has had persistent swelling since the PICC was placed but feels like it may have worsened somewhat in the past week.  No rash.  No recent fevers.  He has chronic knee pain but otherwise denies any acute joint pain elsewhere.  Past Medical History:  Diagnosis Date  . Arm DVT (deep venous thromboembolism), acute ([place])   . Chronic kidney disease   . Coronary artery disease   . Hypertension   . Myocardial infarction [facility])    pt states that he had a "light heart attack about 25 yrs ago"  . Pneumonia   . Stroke [facility])    approx 15 yrs ago    Patient Active Problem List   Diagnosis Date Noted  . Hypercholesterolemia 04/22/2016  . Right renal mass 03/31/2016  . Pre-ulcerative corn or callous 03/03/2016  . Diabetic polyneuropathy associated with type 2 diabetes mellitus ([place]) 03/03/2016  . Cellulitis of middle toe 02/25/2016  . Onychomycosis 08/21/2015  . Metatarsalgia of left foot 08/21/2015  . Metatarsalgia of right foot 07/03/2015  . Bradycardia 05/14/2015  . Brachial plexopathy 05/14/2015  . Right brachial plexitis 05/12/2015  . Radiculopathy 05/10/2015  . Pain 05/10/2015  . Neck  pain on right side 05/10/2015  . Cord compression ([place]) 05/10/2015  . MVP (mitral valve prolapse) 06/10/2013  . Hyperlipidemia 05/11/2013  . HTN (hypertension) 05/11/2013  . Chest pain 05/11/2013  . Encounter for screening colonoscopy 05/19/2012    Past Surgical History:  Procedure Laterality Date  . IR GENERIC HISTORICAL  04/22/2016   IR RADIOLOGIST EVAL & MGMT 04/22/2016 Jason Keen, MD [facility] INTERV RAD  . 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 PACEMAKER REMOVAL  07/2017       Home Medications    Prior to Admission medications   Medication Sig Start Date End Date Taking? Authorizing Provider  amLODipine (NORVASC) 2.5 MG tablet  01/23/16   [provider]  aspirin EC 81 MG tablet Take by mouth daily.    [provider]  atorvastatin (LIPITOR) 40 MG tablet Take 40 mg by mouth daily.    [provider]  carisoprodol (SOMA) 350 MG tablet Take 350 mg by mouth 4 (four) times daily as needed for muscle spasms. Reported on 01/02/2016    [provider]  ciclopirox (LOPROX) 0.77 % cream  11/20/15   [provider]  doxazosin (CARDURA) 2 MG tablet Take 2 mg by mouth at bedtime.    [provider]  furosemide (LASIX) 80 MG tablet Take 80 mg by mouth. Take 1 tablet every AM. Take 1/2 tablet every PM  [provider]  gabapentin (NEURONTIN) 600 MG tablet Take 600 mg by mouth 3 (three) times daily.    [provider]  hydrOXYzine (ATARAX/VISTARIL) 10 MG tablet Take 10 mg by mouth 3 (three) times daily as needed.    [provider]  insulin aspart (NOVOLOG) 100 UNIT/ML injection Inject 15 Units into the skin every evening.    [provider]  Insulin Aspart Prot & Aspart (NOVOLOG MIX 70/30 [place]) Inject 30 Units into the skin every morning.    [provider]  IRON PO Take 65 mg by mouth daily.    [provider]  losartan (COZAAR) 100 MG tablet Take 100 mg by mouth daily.    [provider]  Multiple Vitamins-Minerals (CENTRUM SILVER ADULT 50+ PO) Take 1 tablet by mouth daily.    [provider]  nitroGLYCERIN (NITROSTAT) 0.4 MG SL tablet Place 0.4 mg under the tongue every 5 (five) minutes as needed for chest pain.    [provider]  potassium chloride (K-DUR) 10 MEQ tablet Take 10 mEq by mouth daily.    [provider]    Family History History reviewed. No pertinent family history.  Social History Social History   Tobacco Use  . Smoking status: Never Smoker  . Smokeless tobacco: Never Used  Substance Use Topics  . Alcohol use: No    Alcohol/week: 0.0 oz  . Drug use: No     Allergies   Cephalexin; Lorazepam; Penicillins; Ace inhibitors; Prednisone; and Sulfa antibiotics   Review of Systems Review of Systems  All systems reviewed and negative, other than as noted in HPI.   Physical Exam Updated Vital Signs BP 126/67 (BP Location: Right Arm)   Pulse 60   Temp 98.3 F (36.8 C) (Oral)   Resp 18   Ht 5\' 9"  (1.753 m)   Wt 88 kg (194 lb)   SpO2 98%   BMI 28.65 kg/m   Physical Exam  Constitutional: He appears well-developed and well-nourished. No distress.  HENT:  Head: Normocephalic and atraumatic.  Eyes: Conjunctivae are normal. Right eye exhibits no discharge. Left eye exhibits no discharge.  Neck: Neck supple.  Cardiovascular: Normal rate, regular rhythm and normal heart sounds. Exam reveals no gallop and no friction rub.  No murmur heard. Pulmonary/Chest: Effort normal and breath sounds normal. No respiratory distress.  Abdominal: Soft. He exhibits no distension. There is no tenderness.  Musculoskeletal: He exhibits no edema or tenderness.  Extensive swelling of RUE from hand to axilla. Hand warm. Palpable radial pulse. No concerning skin lesions.   Neurological: He is alert.  Skin: Skin is warm and dry.  Psychiatric: He has a normal mood and affect. His behavior is normal. Thought content normal.  Nursing note  and vitals reviewed.    ED Treatments / Results  Labs (all labs ordered are listed, but only abnormal results are displayed) Labs Reviewed  CBC WITH DIFFERENTIAL/PLATELET  BASIC METABOLIC PANEL    EKG  EKG Interpretation None       Radiology US Venous Img Upper Uni Right  Result Date: 09/09/2017 CLINICAL DATA:  Pain and swelling post PICC line placement, removed 1 week ago. History of left transvenous pacing lead. EXAM: RIGHT UPPER EXTREMITY VENOUS DOPPLER ULTRASOUND TECHNIQUE: Gray-scale sonography with graded compression, as well as color Doppler and duplex ultrasound were performed to evaluate the upper extremity deep venous system from the level of the subclavian vein and including the jugular, axillary, basilic and upper cephalic vein.  Spectral Doppler was utilized to evaluate flow at rest and with distal augmentation maneuvers. COMPARISON:  None. FINDINGS: Thrombus within deep veins: Suggestion of eccentric mural thrombus in the mid subclavian vein, incompletely occlusive. Segmental Thrombosis of right brachial vein in the upper arm with occlusive thrombus and lack of color Doppler flow in the involved segment, patent peripherally. Compressibility of deep veins: Absent in the proximal right brachial vein, otherwise Normal. Duplex waveform respiratory phasicity:  Normal where patent. Duplex waveform response to augmentation:  Normal where patent. Venous reflux:  None visualized. Other findings: Segmental occlusive thrombus in the right basilic vein in the upper arm with lack of color Doppler flow in the involved segment, patent peripherally. There is some eccentric mural thrombus in the right cephalic vein, nonocclusive. Limited images of the contralateral left subclavian vein unremarkable. IMPRESSION: 1. Segmental occlusive DVT in the right proximal  brachial vein. 2. Segmental superficial thrombophlebitis in the right cephalic and basilic veins as above. 3. Possible mural thrombus in the  right subclavian vein, nonocclusive. Electronically Signed   By: Lucrezia Europe M.D.   On: 09/09/2017 10:40    Procedures Procedures (including critical care time)  Medications Ordered in ED Medications - No data to display   Initial Impression / Assessment and Plan / ED Course  I have reviewed the triage vital signs and the nursing notes.  Pertinent labs & imaging results that were available during my care of the patient were reviewed by me and considered in my medical decision making (see chart for details).     67 year old male upper extremity pain and swelling.  Ultrasound is significant for DVT and superficial phlebitis. He has good pulses at the wrist.  He has no acute respiratory complaints.  We will start him on Xarelto.  PRN pain medication for a few days.  Outpatient follow-up.  Final Clinical Impressions(s) / ED Diagnoses   Final diagnoses:  Pain  Arm DVT (deep venous thromboembolism), acute, right [facility])    ED Discharge Orders    None       Virgel Manifold, MD 09/09/17 1636    Virgel Manifold, MD 10/02/17 [phone]

## 2017-09-09 NOTE — ED Notes (Signed)
Per pharmacist Santiago Glad at [facility] can be crushed and placed in 80ml water.  Crushed medicine and placed in water, given to patient.

## 2017-09-11 LAB — MISC LABCORP TEST (SEND OUT)
Labcorp test code: 6627
Misc LabCorp result: 6.8

## 2017-09-14 LAB — CULTURE, BLOOD (ROUTINE X 2)
Culture: NO GROWTH
Culture: NO GROWTH
Special Requests: ADEQUATE
Special Requests: ADEQUATE

## 2017-09-30 ENCOUNTER — Other Ambulatory Visit (HOSPITAL_COMMUNITY): Payer: Self-pay | Admitting: Interventional Radiology

## 2017-09-30 DIAGNOSIS — C641 Malignant neoplasm of right kidney, except renal pelvis: Secondary | ICD-10-CM

## 2017-10-20 DIAGNOSIS — K922 Gastrointestinal hemorrhage, unspecified: Secondary | ICD-10-CM

## 2017-10-20 HISTORY — DX: Gastrointestinal hemorrhage, unspecified: K92.2

## 2017-10-24 DIAGNOSIS — K810 Acute cholecystitis: Secondary | ICD-10-CM

## 2017-10-24 HISTORY — DX: Acute cholecystitis: K81.0

## 2017-10-25 DIAGNOSIS — I82A11 Acute embolism and thrombosis of right axillary vein: Secondary | ICD-10-CM

## 2017-10-25 HISTORY — DX: Acute embolism and thrombosis of right axillary vein: I82.A11

## 2017-12-08 ENCOUNTER — Other Ambulatory Visit: Payer: Self-pay | Admitting: Radiology

## 2017-12-08 DIAGNOSIS — N2889 Other specified disorders of kidney and ureter: Secondary | ICD-10-CM

## 2018-02-16 ENCOUNTER — Other Ambulatory Visit: Payer: Medicare HMO

## 2018-04-11 DIAGNOSIS — K219 Gastro-esophageal reflux disease without esophagitis: Secondary | ICD-10-CM | POA: Insufficient documentation

## 2018-10-01 ENCOUNTER — Other Ambulatory Visit: Payer: Self-pay

## 2018-10-01 ENCOUNTER — Emergency Department (HOSPITAL_BASED_OUTPATIENT_CLINIC_OR_DEPARTMENT_OTHER)
Admission: EM | Admit: 2018-10-01 | Discharge: 2018-10-01 | Disposition: A | Discharge status: Home / Self Care | Payer: Medicare HMO | Attending: Emergency Medicine | Admitting: Emergency Medicine

## 2018-10-01 ENCOUNTER — Encounter (HOSPITAL_BASED_OUTPATIENT_CLINIC_OR_DEPARTMENT_OTHER): Payer: Self-pay | Admitting: *Deleted

## 2018-10-01 DIAGNOSIS — E1140 Type 2 diabetes mellitus with diabetic neuropathy, unspecified: Secondary | ICD-10-CM | POA: Insufficient documentation

## 2018-10-01 DIAGNOSIS — Z79899 Other long term (current) drug therapy: Secondary | ICD-10-CM | POA: Diagnosis not present

## 2018-10-01 DIAGNOSIS — I129 Hypertensive chronic kidney disease with stage 1 through stage 4 chronic kidney disease, or unspecified chronic kidney disease: Secondary | ICD-10-CM | POA: Diagnosis not present

## 2018-10-01 DIAGNOSIS — Z794 Long term (current) use of insulin: Secondary | ICD-10-CM | POA: Diagnosis not present

## 2018-10-01 DIAGNOSIS — E1122 Type 2 diabetes mellitus with diabetic chronic kidney disease: Secondary | ICD-10-CM | POA: Diagnosis not present

## 2018-10-01 DIAGNOSIS — N189 Chronic kidney disease, unspecified: Secondary | ICD-10-CM | POA: Diagnosis not present

## 2018-10-01 DIAGNOSIS — Z7982 Long term (current) use of aspirin: Secondary | ICD-10-CM | POA: Diagnosis not present

## 2018-10-01 DIAGNOSIS — I2510 Atherosclerotic heart disease of native coronary artery without angina pectoris: Secondary | ICD-10-CM | POA: Insufficient documentation

## 2018-10-01 DIAGNOSIS — B029 Zoster without complications: Secondary | ICD-10-CM

## 2018-10-01 DIAGNOSIS — R21 Rash and other nonspecific skin eruption: Secondary | ICD-10-CM | POA: Diagnosis present

## 2018-10-01 MED ORDER — VALACYCLOVIR HCL 1 G PO TABS: 1000.0000 mg | ORAL_TABLET | Freq: Three times a day (TID) | ORAL | 0 refills | Status: DC

## 2018-10-01 MED ORDER — DEXAMETHASONE 6 MG PO TABS
10.0000 mg | ORAL_TABLET | Freq: Once | ORAL | Status: AC
  Administered 2018-10-01: 10 mg via ORAL
  Filled 2018-10-01: qty 1

## 2018-10-01 NOTE — ED Triage Notes (Signed)
Rash on his buttocks and left knee x 2 weeks. He also has abrasions in the areas of the rash. He has been using a cream that his MD gave him a year ago for the same rash he has now.

## 2018-10-01 NOTE — Discharge Instructions (Signed)
Stop the ointment, follow up with your family doc.  Return for worsening symptoms, fever.

## 2018-10-01 NOTE — ED Provider Notes (Signed)
[facility] EMERGENCY DEPARTMENT Provider Note   CSN: [id number] Arrival date & time: 10/01/18  1048     History   Chief Complaint Chief Complaint  Patient presents with  . Rash    HPI Jason Jarvis is a 68 y.o. male.  68 yo M with a chief complaint of a rash.  This is just to the left side of his body and starts peri-sacral and goes to the left anterior knee.  He describes it as burning and itchy.  He has had a rash like this previously and had seen his family doctor who prescribed an ointment for it.  He called him this weekend and they told him to start taking the ointment again though the patient has had worsening of the rash.  Describes it is more diffuse and more painful.  He denies fevers or chills.  Denies other area of rash.  The history is provided by the patient.  Rash  Location:  Leg Leg rash location:  L knee (sacrum) Quality: blistering   Severity:  Moderate Onset quality:  Gradual Duration:  1 week Timing:  Constant Progression:  Worsening Chronicity:  Recurrent Context: not chemical exposure, not exposure to similar rash and not insect bite/sting   Relieved by:  Nothing Worsened by:  Nothing Ineffective treatments:  None tried Associated symptoms: no abdominal pain, no diarrhea, no fever, no headaches, no joint pain, no myalgias, no shortness of breath and not vomiting     Past Medical History:  Diagnosis Date  . Arm DVT (deep venous thromboembolism), acute ([place])   . Chronic kidney disease   . Coronary artery disease   . Hypertension   . Myocardial infarction [facility])    pt states that he had a "light heart attack about 25 yrs ago"  . Pneumonia   . Stroke [facility])    approx 15 yrs ago    Patient Active Problem List   Diagnosis Date Noted  . Hypercholesterolemia 04/22/2016  . Right renal mass 03/31/2016  . Pre-ulcerative corn or callous 03/03/2016  . Diabetic polyneuropathy associated with type 2 diabetes mellitus ([place]) 03/03/2016  .  Cellulitis of middle toe 02/25/2016  . Onychomycosis 08/21/2015  . Metatarsalgia of left foot 08/21/2015  . Metatarsalgia of right foot 07/03/2015  . Bradycardia 05/14/2015  . Brachial plexopathy 05/14/2015  . Right brachial plexitis 05/12/2015  . Radiculopathy 05/10/2015  . Pain 05/10/2015  . Neck pain on right side 05/10/2015  . Cord compression ([place]) 05/10/2015  . MVP (mitral valve prolapse) 06/10/2013  . Hyperlipidemia 05/11/2013  . HTN (hypertension) 05/11/2013  . Chest pain 05/11/2013  . Encounter for screening colonoscopy 05/19/2012    Past Surgical History:  Procedure Laterality Date  . IR GENERIC HISTORICAL  04/22/2016   IR RADIOLOGIST EVAL & MGMT 04/22/2016 Greggory Keen, MD [facility] INTERV RAD  . 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 PACEMAKER REMOVAL  07/2017        Home Medications    Prior to Admission medications   Medication Sig Start Date End Date Taking? Authorizing Provider  amLODipine (NORVASC) 2.5 MG tablet  01/23/16   [provider]  aspirin EC 81 MG tablet Take by mouth daily.    [provider]  atorvastatin (LIPITOR) 40 MG tablet Take 40 mg by mouth daily.    [provider]  carisoprodol (SOMA) 350 MG tablet Take 350 mg by mouth 4 (four) times daily as needed for muscle spasms. Reported on 01/02/2016    [provider]  ciclopirox (LOPROX) 0.77 % cream  11/20/15   [provider]  doxazosin (CARDURA) 2 MG tablet Take 2 mg by mouth at bedtime.    [provider]  furosemide (LASIX) 80 MG tablet Take 80 mg by mouth. Take 1 tablet every AM. Take 1/2 tablet every PM    [provider]  gabapentin (NEURONTIN) 600 MG tablet Take 600 mg by mouth 3 (three) times daily.    [provider]  HYDROcodone-acetaminophen (NORCO/VICODIN) 5-325 MG tablet Take 1-2 tablets by mouth every 4 (four) hours as needed. 09/09/17   Virgel Manifold, MD  hydrOXYzine (ATARAX/VISTARIL) 10 MG tablet Take 10 mg by mouth 3  (three) times daily as needed.    [provider]  insulin aspart (NOVOLOG) 100 UNIT/ML injection Inject 15 Units into the skin every evening.    [provider]  Insulin Aspart Prot & Aspart (NOVOLOG MIX 70/30 [place]) Inject 30 Units into the skin every morning.    [provider]  IRON PO Take 65 mg by mouth daily.    [provider]  losartan (COZAAR) 100 MG tablet Take 100 mg by mouth daily.    [provider]  Multiple Vitamins-Minerals (CENTRUM SILVER ADULT 50+ PO) Take 1 tablet by mouth daily.    [provider]  nitroGLYCERIN (NITROSTAT) 0.4 MG SL tablet Place 0.4 mg under the tongue every 5 (five) minutes as needed for chest pain.    [provider]  potassium chloride (K-DUR) 10 MEQ tablet Take 10 mEq by mouth daily.    [provider]  Rivaroxaban 15 & 20 MG TBPK Take as directed on package: Start with one 15mg  tablet by mouth twice a day with food. On Day 22, switch to one 20mg  tablet once a day with food. 09/09/17   Virgel Manifold, MD  valACYclovir (VALTREX) 1000 MG tablet Take 1 tablet (1,000 mg total) by mouth 3 (three) times daily. 10/01/18   Deno Etienne, DO    Family History No family history on file.  Social History Social History   Tobacco Use  . Smoking status: Never Smoker  . Smokeless tobacco: Never Used  Substance Use Topics  . Alcohol use: No    Alcohol/week: 0.0 standard drinks  . Drug use: No     Allergies   Cephalexin; Lorazepam; Penicillins; Ace inhibitors; Prednisone; and Sulfa antibiotics   Review of Systems Review of Systems  Constitutional: Negative for chills and fever.  HENT: Negative for congestion and facial swelling.   Eyes: Negative for discharge and visual disturbance.  Respiratory: Negative for shortness of breath.   Cardiovascular: Negative for chest pain and palpitations.  Gastrointestinal: Negative for abdominal pain, diarrhea and vomiting.  Musculoskeletal: Negative for  arthralgias and myalgias.  Skin: Positive for rash. Negative for color change.  Neurological: Negative for tremors, syncope and headaches.  Psychiatric/Behavioral: Negative for confusion and dysphoric mood.     Physical Exam Updated Vital Signs BP (!) 142/107   Pulse 74   Temp 98.9 F (37.2 C) (Oral)   Resp 20   Ht 5\' 9"  (1.753 m)   Wt 96.6 kg   SpO2 98%   BMI 31.45 kg/m   Physical Exam Vitals signs and nursing note reviewed.  Constitutional:      Appearance: He is well-developed.  HENT:     Head: Normocephalic and atraumatic.  Eyes:     Pupils: Pupils are equal, round, and reactive to light.  Neck:     Musculoskeletal: Normal range  of motion and neck supple.     Vascular: No JVD.  Cardiovascular:     Rate and Rhythm: Normal rate and regular rhythm.     Heart sounds: No murmur. No friction rub. No gallop.   Pulmonary:     Effort: No respiratory distress.     Breath sounds: No wheezing.  Abdominal:     General: There is no distension.     Tenderness: There is no guarding or rebound.  Musculoskeletal: Normal range of motion.  Skin:    Coloration: Skin is not pale.     Findings: Rash present.       Neurological:     Mental Status: He is alert and oriented to person, place, and time.  Psychiatric:        Behavior: Behavior normal.      ED Treatments / Results  Labs (all labs ordered are listed, but only abnormal results are displayed) Labs Reviewed - No data to display  EKG None  Radiology No results found.  Procedures Procedures (including critical care time)  Medications Ordered in ED Medications  dexamethasone (DECADRON) tablet 10 mg (10 mg Oral Given 10/01/18 1141)     Initial Impression / Assessment and Plan / ED Course  I have reviewed the triage vital signs and the nursing notes.  Pertinent labs & imaging results that were available during my care of the patient were reviewed by me and considered in my medical decision making (see chart  for details).     68 yo M with a chief complaint of a rash.  This is just left-sided and just to the sacrum into his leg.  Appears like shingles, also describing it as burning pain.  He has had it for 2 weeks now and so I discussed the limitations of treatment with the patient and the we will start him on antivirals and give a dose of Decadron here since he has had an adverse reaction to prednisone.  The other possibility is that the patient has contact dermatitis to whatever ointment that he has been rubbing to these areas, has a similar appearing rash.  We will have him follow-up with his family doctor.  11:42 AM:  I have discussed the diagnosis/risks/treatment options with the patient and family and believe the pt to be eligible for discharge home to follow-up with PCP. We also discussed returning to the ED immediately if new or worsening sx occur. We discussed the sx which are most concerning (e.g., sudden worsening pain, fever, inability to tolerate by mouth) that necessitate immediate return. Medications administered to the patient during their visit and any new prescriptions provided to the patient are listed below.  Medications given during this visit Medications  dexamethasone (DECADRON) tablet 10 mg (10 mg Oral Given 10/01/18 1141)     The patient appears reasonably screen and/or stabilized for discharge and I doubt any other medical condition or other [facility] requiring further screening, evaluation, or treatment in the ED at this time prior to discharge.    Final Clinical Impressions(s) / ED Diagnoses   Final diagnoses:  Herpes zoster without complication    ED Discharge Orders         Ordered    valACYclovir (VALTREX) 1000 MG tablet  3 times daily     10/01/18 [place], Calvin Chura, [place] 10/01/18 1142

## 2018-11-11 ENCOUNTER — Emergency Department (HOSPITAL_BASED_OUTPATIENT_CLINIC_OR_DEPARTMENT_OTHER)
Admission: EM | Admit: 2018-11-11 | Discharge: 2018-11-11 | Disposition: A | Discharge status: Home / Self Care | Payer: Medicare HMO | Attending: Emergency Medicine | Admitting: Emergency Medicine

## 2018-11-11 ENCOUNTER — Encounter (HOSPITAL_BASED_OUTPATIENT_CLINIC_OR_DEPARTMENT_OTHER): Payer: Self-pay | Admitting: Emergency Medicine

## 2018-11-11 ENCOUNTER — Emergency Department (HOSPITAL_BASED_OUTPATIENT_CLINIC_OR_DEPARTMENT_OTHER): Payer: Medicare HMO

## 2018-11-11 ENCOUNTER — Other Ambulatory Visit: Payer: Self-pay

## 2018-11-11 DIAGNOSIS — E1143 Type 2 diabetes mellitus with diabetic autonomic (poly)neuropathy: Secondary | ICD-10-CM | POA: Diagnosis not present

## 2018-11-11 DIAGNOSIS — N189 Chronic kidney disease, unspecified: Secondary | ICD-10-CM | POA: Insufficient documentation

## 2018-11-11 DIAGNOSIS — Z794 Long term (current) use of insulin: Secondary | ICD-10-CM | POA: Diagnosis not present

## 2018-11-11 DIAGNOSIS — I129 Hypertensive chronic kidney disease with stage 1 through stage 4 chronic kidney disease, or unspecified chronic kidney disease: Secondary | ICD-10-CM | POA: Diagnosis not present

## 2018-11-11 DIAGNOSIS — B349 Viral infection, unspecified: Secondary | ICD-10-CM

## 2018-11-11 DIAGNOSIS — E1122 Type 2 diabetes mellitus with diabetic chronic kidney disease: Secondary | ICD-10-CM | POA: Diagnosis not present

## 2018-11-11 DIAGNOSIS — Z79899 Other long term (current) drug therapy: Secondary | ICD-10-CM | POA: Insufficient documentation

## 2018-11-11 DIAGNOSIS — R197 Diarrhea, unspecified: Secondary | ICD-10-CM

## 2018-11-11 DIAGNOSIS — Z7982 Long term (current) use of aspirin: Secondary | ICD-10-CM | POA: Diagnosis not present

## 2018-11-11 DIAGNOSIS — I2510 Atherosclerotic heart disease of native coronary artery without angina pectoris: Secondary | ICD-10-CM | POA: Insufficient documentation

## 2018-11-11 LAB — CBC WITH DIFFERENTIAL/PLATELET
ABS IMMATURE GRANULOCYTES: 0.04 10*3/uL (ref 0.00–0.07)
BASOS ABS: 0 10*3/uL (ref 0.0–0.1)
Basophils Relative: 0 %
EOS PCT: 2 %
Eosinophils Absolute: 0.1 10*3/uL (ref 0.0–0.5)
HEMATOCRIT: 33.4 % — AB (ref 39.0–52.0)
HEMOGLOBIN: 9.9 g/dL — AB (ref 13.0–17.0)
Immature Granulocytes: 1 %
LYMPHS ABS: 1.3 10*3/uL (ref 0.7–4.0)
LYMPHS PCT: 16 %
MCH: 24.8 pg — ABNORMAL LOW (ref 26.0–34.0)
MCHC: 29.6 g/dL — AB (ref 30.0–36.0)
MCV: 83.5 fL (ref 80.0–100.0)
Monocytes Absolute: 0.9 10*3/uL (ref 0.1–1.0)
Monocytes Relative: 11 %
NEUTROS ABS: 5.6 10*3/uL (ref 1.7–7.7)
NRBC: 0 % (ref 0.0–0.2)
Neutrophils Relative %: 70 %
Platelets: 185 10*3/uL (ref 150–400)
RBC: 4 MIL/uL — AB (ref 4.22–5.81)
RDW: 15.9 % — ABNORMAL HIGH (ref 11.5–15.5)
WBC: 8 10*3/uL (ref 4.0–10.5)

## 2018-11-11 LAB — HEPATIC FUNCTION PANEL
ALBUMIN: 3.6 g/dL (ref 3.5–5.0)
ALK PHOS: 93 U/L (ref 38–126)
ALT: 12 U/L (ref 0–44)
AST: 12 U/L — ABNORMAL LOW (ref 15–41)
BILIRUBIN INDIRECT: 0.5 mg/dL (ref 0.3–0.9)
Bilirubin, Direct: 0.1 mg/dL (ref 0.0–0.2)
TOTAL PROTEIN: 6.8 g/dL (ref 6.5–8.1)
Total Bilirubin: 0.6 mg/dL (ref 0.3–1.2)

## 2018-11-11 LAB — BASIC METABOLIC PANEL
ANION GAP: 6 (ref 5–15)
BUN: 13 mg/dL (ref 8–23)
CHLORIDE: 102 mmol/L (ref 98–111)
CO2: 30 mmol/L (ref 22–32)
Calcium: 9.1 mg/dL (ref 8.9–10.3)
Creatinine, Ser: 1.22 mg/dL (ref 0.61–1.24)
GFR calc non Af Amer: 60 mL/min — ABNORMAL LOW (ref >60)
Glucose, Bld: 145 mg/dL — ABNORMAL HIGH (ref 70–99)
POTASSIUM: 4.1 mmol/L (ref 3.5–5.1)
SODIUM: 138 mmol/L (ref 135–145)

## 2018-11-11 LAB — LACTIC ACID, PLASMA: LACTIC ACID, VENOUS: 1.8 mmol/L (ref 0.5–1.9)

## 2018-11-11 MED ORDER — SODIUM CHLORIDE 0.9 % IV BOLUS: 1000.0000 mL | Freq: Once | INTRAVENOUS | Status: DC

## 2018-11-11 MED ORDER — ACETAMINOPHEN 500 MG PO TABS
1000.0000 mg | ORAL_TABLET | Freq: Once | ORAL | Status: AC
  Administered 2018-11-11: 1000 mg via ORAL
  Filled 2018-11-11: qty 2

## 2018-11-11 NOTE — ED Notes (Signed)
This RN walked pt out to check out window.

## 2018-11-11 NOTE — ED Provider Notes (Signed)
[facility] Provider Note   CSN: [id number] Arrival date & time: 11/11/18  2153    History   Chief Complaint Chief Complaint  Patient presents with  . Influenza    HPI Jason Jarvis is a 68 y.o. male.     The history is provided by the patient.  Illness  Location:  General, body aches, nausea, diarrhea, vomiting Severity:  Mild Onset quality:  Gradual Timing:  Intermittent Progression:  Waxing and waning Chronicity:  New Context:  Patient with symptoms that started earlier today, called PCP who prescibed a zpak and zofran. Patient with worsening body aches, cough, diarrhea but no abdominal pain. Patient denies blood in stool or vomit. Has had flu shot, no recent travel. Has not taken tylenol Relieved by:  Zofran Worsened by:  Nothing Associated symptoms: congestion, cough, diarrhea, fever, myalgias, nausea, shortness of breath and vomiting   Associated symptoms: no abdominal pain, no chest pain, no ear pain, no rash and no sore throat     Past Medical History:  Diagnosis Date  . Arm DVT (deep venous thromboembolism), acute ([place])   . Chronic kidney disease   . Coronary artery disease   . Hypertension   . Myocardial infarction [facility])    pt states that he had a "light heart attack about 25 yrs ago"  . Pneumonia   . Stroke [facility])    approx 15 yrs ago    Patient Active Problem List   Diagnosis Date Noted  . Hypercholesterolemia 04/22/2016  . Right renal mass 03/31/2016  . Pre-ulcerative corn or callous 03/03/2016  . Diabetic polyneuropathy associated with type 2 diabetes mellitus ([place]) 03/03/2016  . Cellulitis of middle toe 02/25/2016  . Onychomycosis 08/21/2015  . Metatarsalgia of left foot 08/21/2015  . Metatarsalgia of right foot 07/03/2015  . Bradycardia 05/14/2015  . Brachial plexopathy 05/14/2015  . Right brachial plexitis 05/12/2015  . Radiculopathy 05/10/2015  . Pain 05/10/2015  . Neck pain on right side 05/10/2015  . Cord  compression ([place]) 05/10/2015  . MVP (mitral valve prolapse) 06/10/2013  . Hyperlipidemia 05/11/2013  . HTN (hypertension) 05/11/2013  . Chest pain 05/11/2013  . Encounter for screening colonoscopy 05/19/2012    Past Surgical History:  Procedure Laterality Date  . IR GENERIC HISTORICAL  04/22/2016   IR RADIOLOGIST EVAL & MGMT 04/22/2016 Greggory Keen, MD [facility] INTERV RAD  . 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 PACEMAKER REMOVAL  07/2017        Home Medications    Prior to Admission medications   Medication Sig Start Date End Date Taking? Authorizing Provider  amLODipine (NORVASC) 2.5 MG tablet  01/23/16   [provider]  aspirin EC 81 MG tablet Take by mouth daily.    [provider]  atorvastatin (LIPITOR) 40 MG tablet Take 40 mg by mouth daily.    [provider]  carisoprodol (SOMA) 350 MG tablet Take 350 mg by mouth 4 (four) times daily as needed for muscle spasms. Reported on 01/02/2016    [provider]  ciclopirox (LOPROX) 0.77 % cream  11/20/15   [provider]  doxazosin (CARDURA) 2 MG tablet Take 2 mg by mouth at bedtime.    [provider]  furosemide (LASIX) 80 MG tablet Take 80 mg by mouth. Take 1 tablet every AM. Take 1/2 tablet every PM    [provider]  gabapentin (NEURONTIN) 600 MG tablet Take 600 mg by mouth 3 (three) times daily.    [provider]  HYDROcodone-acetaminophen (NORCO/VICODIN) 5-325 MG tablet Take 1-2 tablets by mouth every 4 (four) hours as needed. 09/09/17   Virgel Manifold, MD  hydrOXYzine (ATARAX/VISTARIL) 10 MG tablet Take 10 mg by mouth 3 (three) times daily as needed.    [provider]  insulin aspart (NOVOLOG) 100 UNIT/ML injection Inject 15 Units into the skin every evening.    [provider]  Insulin Aspart Prot & Aspart (NOVOLOG MIX 70/30 [place]) Inject 30 Units into the skin every morning.    [provider]  IRON PO Take 65 mg by mouth daily.     [provider]  losartan (COZAAR) 100 MG tablet Take 100 mg by mouth daily.    [provider]  Multiple Vitamins-Minerals (CENTRUM SILVER ADULT 50+ PO) Take 1 tablet by mouth daily.    [provider]  nitroGLYCERIN (NITROSTAT) 0.4 MG SL tablet Place 0.4 mg under the tongue every 5 (five) minutes as needed for chest pain.    [provider]  potassium chloride (K-DUR) 10 MEQ tablet Take 10 mEq by mouth daily.    [provider]  Rivaroxaban 15 & 20 MG TBPK Take as directed on package: Start with one 15mg  tablet by mouth twice a day with food. On Day 22, switch to one 20mg  tablet once a day with food. 09/09/17   Virgel Manifold, MD  valACYclovir (VALTREX) 1000 MG tablet Take 1 tablet (1,000 mg total) by mouth 3 (three) times daily. 10/01/18   Deno Etienne, DO    Family History No family history on file.  Social History Social History   Tobacco Use  . Smoking status: Never Smoker  . Smokeless tobacco: Never Used  Substance Use Topics  . Alcohol use: No    Alcohol/week: 0.0 standard drinks  . Drug use: No     Allergies   Cephalexin; Lorazepam; Penicillins; Ace inhibitors; Prednisone; and Sulfa antibiotics   Review of Systems Review of Systems  Constitutional: Positive for fever. Negative for chills.  HENT: Positive for congestion and sinus pain. Negative for ear pain and sore throat.   Eyes: Negative for pain and visual disturbance.  Respiratory: Positive for cough and shortness of breath.   Cardiovascular: Negative for chest pain and palpitations.  Gastrointestinal: Positive for diarrhea, nausea and vomiting. Negative for abdominal pain.  Genitourinary: Negative for dysuria and hematuria.  Musculoskeletal: Positive for myalgias. Negative for arthralgias and back pain.  Skin: Negative for color change and rash.  Neurological: Negative for seizures and syncope.  All other systems reviewed and are negative.    Physical Exam Updated  Vital Signs  ED Triage Vitals  Enc Vitals Group     BP 11/11/18 2200 (!) 155/76     Pulse Rate 11/11/18 2200 82     Resp 11/11/18 2200 (!) 24     Temp 11/11/18 2200 99.6 F (37.6 C)     Temp Source 11/11/18 2200 Oral     SpO2 11/11/18 2200 95 %     Weight 11/11/18 2201 213 lb (96.6 kg)     Height 11/11/18 2201 5\' 9"  (1.753 m)     Head Circumference --      Peak Flow --      Pain Score 11/11/18 2201 6     Pain Loc --      Pain Edu? --      Excl. in [place]? --     Physical Exam Vitals signs and nursing note reviewed.  Constitutional:  General: He is not in acute distress.    Appearance: He is well-developed. He is not ill-appearing.  HENT:     Head: Normocephalic and atraumatic.     Nose: Nose normal.     Mouth/Throat:     Mouth: Mucous membranes are moist.  Eyes:     Extraocular Movements: Extraocular movements intact.     Conjunctiva/sclera: Conjunctivae normal.     Pupils: Pupils are equal, round, and reactive to light.  Neck:     Musculoskeletal: Normal range of motion and neck supple.  Cardiovascular:     Rate and Rhythm: Normal rate and regular rhythm.     Pulses: Normal pulses.     Heart sounds: Normal heart sounds. No murmur.  Pulmonary:     Effort: Pulmonary effort is normal. No respiratory distress.     Breath sounds: No wheezing.     Comments: Coarse breath sounds throughout Abdominal:     General: Abdomen is flat. There is no distension.     Palpations: Abdomen is soft.     Tenderness: There is no abdominal tenderness. There is no guarding.     Hernia: No hernia is present.  Musculoskeletal: Normal range of motion.     Right lower leg: No edema.     Left lower leg: No edema.  Skin:    General: Skin is warm and dry.     Capillary Refill: Capillary refill takes less than 2 seconds.  Neurological:     General: No focal deficit present.     Mental Status: He is alert and oriented to person, place, and time.     Cranial Nerves: No cranial nerve deficit.      Sensory: No sensory deficit.     Motor: No weakness.     Coordination: Coordination normal.     Gait: Gait normal.  Psychiatric:        Mood and Affect: Mood normal.      ED Treatments / Results  Labs (all labs ordered are listed, but only abnormal results are displayed) Labs Reviewed  CBC WITH DIFFERENTIAL/PLATELET - Abnormal; Notable for the following components:      Result Value   RBC 4.00 (*)    Hemoglobin 9.9 (*)    HCT 33.4 (*)    MCH 24.8 (*)    MCHC 29.6 (*)    RDW 15.9 (*)    All other components within normal limits  BASIC METABOLIC PANEL - Abnormal; Notable for the following components:   Glucose, Bld 145 (*)    GFR calc non Af Amer 60 (*)    All other components within normal limits  HEPATIC FUNCTION PANEL - Abnormal; Notable for the following components:   AST 12 (*)    All other components within normal limits  LACTIC ACID, PLASMA    EKG None  Radiology Dg Chest 2 View  Result Date: 11/11/2018 CLINICAL DATA:  Flu like symptoms x2 days.  Chest pressure. EXAM: CHEST - 2 VIEW COMPARISON:  CXR exams from 04/10/2018 and 03/29/2018 FINDINGS: Borderline cardiomegaly with mild aortic atherosclerosis. No aneurysm. No acute pulmonary consolidation, edema, effusion or pneumothorax. Left-sided pacemaker apparatus with leads in the right atrium and right ventricle are identified. Both lungs are clear. Stable degenerative change along thoracic spine. IMPRESSION: No active cardiopulmonary disease. Electronically Signed   By: Ashley Royalty M.D.   On: 11/11/2018 22:34    Procedures Procedures (including critical care time)  Medications Ordered in ED Medications  acetaminophen (TYLENOL) tablet 1,000  mg (1,000 mg Oral Given 11/11/18 2226)     Initial Impression / Assessment and Plan / ED Course  I have reviewed the triage vital signs and the nursing notes.  Pertinent labs & imaging results that were available during my care of the patient were reviewed by me and  considered in my medical decision making (see chart for details).     Jason Jarvis is a 68 year old male with history of hypertension, CKD, stroke, CAD, DVT on blood thinners who presents to the ED with nausea, vomiting, diarrhea, cough.  Patient with symptoms for the last several hours.  Has overall unremarkable vitals.  Low-grade temperature.  Patient had a Zithromax and Zofran called in by primary care doctor today.  Was told to come for evaluation if he felt worse.  Patient has no abdominal tenderness on exam.  Has some coarse breath sounds on exam.  No signs of volume overload.  Denies any urinary symptoms.  Denies any recent travel, sick contacts, suspicious food intake.  However, [facility]  Patient likely with viral process.  Chest x-ray showed no pneumonia, no pneumothorax, no pleural effusion.  Patient did not have any significant anemia, electrolyte abnormality, leukocytosis.  Lactic acid within normal limits.  Patient was given Tylenol with improvement of body aches and pains.  Overall suspect viral process.  Patient was already given Zofran by primary care doctor.  Recommend that he likely discontinue antibiotic as could cause worsening symptoms.  Recommend close follow-up with primary care doctor and told her return to the ED if symptoms worsen.  This chart was dictated using voice recognition software.  Despite best efforts to proofread,  errors can occur which can change the documentation meaning.    Final Clinical Impressions(s) / ED Diagnoses   Final diagnoses:  Viral syndrome  Diarrhea, unspecified type    ED Discharge Orders    None       Lennice Sites, DO 11/11/18 2259

## 2018-11-11 NOTE — ED Triage Notes (Signed)
Pt having flu like symptoms since yesterday with increase SOB.

## 2019-02-10 DIAGNOSIS — Z85528 Personal history of other malignant neoplasm of kidney: Secondary | ICD-10-CM

## 2019-02-10 HISTORY — DX: Personal history of other malignant neoplasm of kidney: Z85.528

## 2019-04-22 ENCOUNTER — Other Ambulatory Visit: Payer: Self-pay | Admitting: Interventional Radiology

## 2019-04-22 DIAGNOSIS — N2889 Other specified disorders of kidney and ureter: Secondary | ICD-10-CM

## 2019-04-29 ENCOUNTER — Other Ambulatory Visit: Payer: Self-pay | Admitting: Interventional Radiology

## 2019-04-29 DIAGNOSIS — C641 Malignant neoplasm of right kidney, except renal pelvis: Secondary | ICD-10-CM

## 2019-05-18 ENCOUNTER — Other Ambulatory Visit: Payer: Self-pay

## 2019-05-18 ENCOUNTER — Encounter: Payer: Self-pay | Admitting: *Deleted

## 2019-05-18 ENCOUNTER — Ambulatory Visit
Admission: RE | Admit: 2019-05-18 | Discharge: 2019-05-18 | Disposition: A | Discharge status: Home / Self Care | Payer: Medicare HMO | Source: Ambulatory Visit | Attending: Interventional Radiology | Admitting: Interventional Radiology

## 2019-05-18 DIAGNOSIS — C641 Malignant neoplasm of right kidney, except renal pelvis: Secondary | ICD-10-CM

## 2019-05-18 HISTORY — PX: IR RADIOLOGIST EVAL & MGMT: IMG5224

## 2019-05-18 NOTE — Progress Notes (Signed)
Patient ID: Jason Jarvis, male   DOB: 1951-09-02, 68 y.o.   MRN: [id number]       Chief Complaint: Patient was consulted remotely today ([place]) for renal cell carcinoma at the request of Dr. Estill Dooms  Referring Physician(s): [place]  History of Present Illness: Jason Jarvis is a 68 y.o. male who is now approximately 3 years status post CT-guided cryoablation of a 2.4 cm right renal cell carcinoma.  Procedure originally performed 12/06/2015 at [facility].  Clinically he remains asymptomatic.  No significant flank or abdominal pain.  No hematuria or dysuria.  He is at his baseline.  He has chronic back pain related to lumbar degenerative disc disease.  Stable weight.  He has had surveillance imaging annually.  Most recent imaging 05/09/2019.  This was a CT without and with contrast.  Most recent CT confirms a new 2 cm solid enhancing renal lesion along the right kidney just superior to the old ablation defect concerning for a delayed recurrence versus a new lesion in the right kidney.  This lesion is concerning for renal cell carcinoma.  Past Medical History:  Diagnosis Date  . Arm DVT (deep venous thromboembolism), acute ([place])   . Chronic kidney disease   . Coronary artery disease   . Hypertension   . Myocardial infarction [facility])    pt states that he had a "light heart attack about 25 yrs ago"  . Pneumonia   . Stroke ([place])    approx 15 yrs ago    Past Surgical History:  Procedure Laterality Date  . IR GENERIC HISTORICAL  04/22/2016   IR RADIOLOGIST EVAL & MGMT 04/22/2016 Greggory Keen, MD [facility] INTERV RAD  . 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 PACEMAKER REMOVAL  07/2017    Allergies: Cephalexin, Lorazepam, Penicillins, Ace inhibitors, Prednisone, and Sulfa antibiotics  Medications: Prior to Admission medications   Medication Sig Start Date End Date Taking? Authorizing Provider  amLODipine (NORVASC) 2.5 MG tablet  01/23/16   [provider]  aspirin EC 81 MG tablet  Take by mouth daily.    [provider]  atorvastatin (LIPITOR) 40 MG tablet Take 40 mg by mouth daily.    [provider]  carisoprodol (SOMA) 350 MG tablet Take 350 mg by mouth 4 (four) times daily as needed for muscle spasms. Reported on 01/02/2016    [provider]  ciclopirox (LOPROX) 0.77 % cream  11/20/15   [provider]  doxazosin (CARDURA) 2 MG tablet Take 2 mg by mouth at bedtime.    [provider]  furosemide (LASIX) 80 MG tablet Take 80 mg by mouth. Take 1 tablet every AM. Take 1/2 tablet every PM    [provider]  gabapentin (NEURONTIN) 600 MG tablet Take 600 mg by mouth 3 (three) times daily.    [provider]  HYDROcodone-acetaminophen (NORCO/VICODIN) 5-325 MG tablet Take 1-2 tablets by mouth every 4 (four) hours as needed. 09/09/17   Virgel Manifold, MD  hydrOXYzine (ATARAX/VISTARIL) 10 MG tablet Take 10 mg by mouth 3 (three) times daily as needed.    [provider]  insulin aspart (NOVOLOG) 100 UNIT/ML injection Inject 15 Units into the skin every evening.    [provider]  Insulin Aspart Prot & Aspart (NOVOLOG MIX 70/30 [place]) Inject 30 Units into the skin every morning.    [provider]  IRON PO Take 65 mg by mouth daily.    [provider]  losartan (COZAAR) 100 MG tablet Take 100  mg by mouth daily.    [provider]  Multiple Vitamins-Minerals (CENTRUM SILVER ADULT 50+ PO) Take 1 tablet by mouth daily.    [provider]  nitroGLYCERIN (NITROSTAT) 0.4 MG SL tablet Place 0.4 mg under the tongue every 5 (five) minutes as needed for chest pain.    [provider]  potassium chloride (K-DUR) 10 MEQ tablet Take 10 mEq by mouth daily.    [provider]  Rivaroxaban 15 & 20 MG TBPK Take as directed on package: Start with one 15mg  tablet by mouth twice a day with food. On Day 22, switch to one 20mg  tablet once a day with food. 09/09/17   Virgel Manifold, MD  valACYclovir (VALTREX) 1000 MG tablet Take 1 tablet (1,000 mg total) by mouth 3 (three) times daily. 10/01/18   Deno Etienne, DO     No family history on file.  Social History   Socioeconomic History  . Marital status: Married    Spouse name: Not on file  . Number of children: Not on file  . Years of education: Not on file  . Highest education level: Not on file  Occupational History  . Not on file  Social Needs  . Financial resource strain: Not on file  . Food insecurity    Worry: Not on file    Inability: Not on file  . Transportation needs    Medical: Not on file    Non-medical: Not on file  Tobacco Use  . Smoking status: Never Smoker  . Smokeless tobacco: Never Used  Substance and Sexual Activity  . Alcohol use: No    Alcohol/week: 0.0 standard drinks  . Drug use: No  . Sexual activity: Not on file  Lifestyle  . Physical activity    Days per week: Not on file    Minutes per session: Not on file  . Stress: Not on file  Relationships  . Social Herbalist on phone: Not on file    Gets together: Not on file    Attends religious service: Not on file    Active member of club or organization: Not on file    Attends meetings of clubs or organizations: Not on file    Relationship status: Not on file  Other Topics Concern  . Not on file  Social History Narrative  . Not on file    ECOG Status: 1 - Symptomatic but completely ambulatory  Review of Systems  Review of Systems: A 12 point ROS discussed and pertinent positives are indicated in the HPI above.  All other systems are negative.  Physical Exam No direct physical exam was performed, tele visit only today Vital Signs: There were no vitals taken for this visit.  Imaging: No results found.  Labs:  CBC: Recent Labs    11/11/18 2218  WBC 8.0  HGB 9.9*  HCT 33.4*  PLT 185    COAGS: No results for input(s): INR, APTT in the last 8760 hours.  BMP: Recent Labs    11/11/18  2218  NA 138  K 4.1  CL 102  CO2 30  GLUCOSE 145*  BUN 13  CALCIUM 9.1  CREATININE 1.22  GFRNONAA 60*  GFRAA >60    LIVER FUNCTION TESTS: Recent Labs    11/11/18 2218  BILITOT 0.6  AST 12*  ALT 12  ALKPHOS 93  PROT 6.8  ALBUMIN 3.6    TUMOR MARKERS: No results for input(s): AFPTM, CEA, CA199, CHROMGRNA in the  last 8760 hours.  Assessment and Plan:  New 2 cm solid enhancing lesion in the right kidney interpolar region just superior to the old right renal ablation defect concerning for delayed recurrence versus a new lesion in the right kidney.  Imaging characteristics concerning for renal cell carcinoma.  The imaging surveillance findings were reviewed with the patient and his wife by telephone today because of the [place] pandemic.  The lesion size and location is amenable to repeat image guided cryoablation performed with anesthesia.  This can be scheduled electively at [facility].  The procedure, risk, benefits and alternatives were reviewed.  The expected goals and continue outpatient surveillance all reviewed.  All questions were addressed.  Patient has a clear understanding.  He would like to proceed with scheduling this electively at [place]: Schedule for CT-guided biopsy and cryoablation of a 2 cm right renal mass.   Electronically Signed: Greggory Keen 05/18/2019, 2:31 PM   I spent a total of    25 Minutes in remote  clinical consultation, greater than 50% of which was counseling/coordinating care for this patient with a recurrent right renal mass compatible with renal cell carcinoma.    Visit type: Audio only (telephone). Audio (no video) only due to patient's lack of internet/smartphone capability. Alternative for in-person consultation at [facility], [address] [place], [place], [place]. This visit type was conducted due to national recommendations for restrictions regarding the COVID-19 Pandemic (e.g. social distancing).  This  format is felt to be most appropriate for this patient at this time.  All issues noted in this document were discussed and addressed.

## 2019-06-09 ENCOUNTER — Other Ambulatory Visit: Payer: Self-pay | Admitting: Physician Assistant

## 2019-06-09 DIAGNOSIS — C641 Malignant neoplasm of right kidney, except renal pelvis: Secondary | ICD-10-CM

## 2019-06-09 HISTORY — DX: Malignant neoplasm of right kidney, except renal pelvis: C64.1

## 2019-07-06 ENCOUNTER — Other Ambulatory Visit: Payer: Self-pay

## 2019-07-06 ENCOUNTER — Encounter: Payer: Self-pay | Admitting: *Deleted

## 2019-07-06 ENCOUNTER — Ambulatory Visit
Admission: RE | Admit: 2019-07-06 | Discharge: 2019-07-06 | Disposition: A | Discharge status: Home / Self Care | Payer: Medicare HMO | Source: Ambulatory Visit | Attending: Physician Assistant | Admitting: Physician Assistant

## 2019-07-06 DIAGNOSIS — C641 Malignant neoplasm of right kidney, except renal pelvis: Secondary | ICD-10-CM

## 2019-07-06 HISTORY — PX: IR RADIOLOGIST EVAL & MGMT: IMG5224

## 2019-07-06 NOTE — Progress Notes (Signed)
Patient ID: Jason Jarvis, male   DOB: [birth year], 68 y.o.   MRN: [id number]       Chief Complaint:   Right renal cell carcinoma  Referring Physician(s): Watterson,Shannon A  History of Present Illness: Jason Jarvis is a 68 y.o. male who is now 1 month status post a second CT-guided cryoablation of a right renal cell carcinoma recurrence along a previous ablation defect.  Procedure performed at [facility] 06/09/2019.  Procedure was successful and he was discharged later that day.  Overall he is recovered at home very well.  Minor right flank discomfort but no significant pain that warrants medication.  No hematuria or dysuria.  He is back to his baseline.  He does have chronic back pain related to lumbar degenerative disc disease.  Stable weight.  Follow-up imaging has not been performed yet.  Past Medical History:  Diagnosis Date  . Arm DVT (deep venous thromboembolism), acute ([place])   . Chronic kidney disease   . Coronary artery disease   . Hypertension   . Myocardial infarction [facility])    pt states that he had a "light heart attack about 25 yrs ago"  . Pneumonia   . Stroke ([place])    approx 15 yrs ago    Past Surgical History:  Procedure Laterality Date  . IR GENERIC HISTORICAL  04/22/2016   IR RADIOLOGIST EVAL & MGMT 04/22/2016 Greggory Keen, MD [facility] INTERV RAD  . IR RADIOLOGIST EVAL & MGMT  05/18/2019  . 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 PACEMAKER REMOVAL  07/2017    Allergies: Cephalexin, Lorazepam, Penicillins, Ace inhibitors, Prednisone, and Sulfa antibiotics  Medications: Prior to Admission medications   Medication Sig Start Date End Date Taking? Authorizing Provider  amLODipine (NORVASC) 2.5 MG tablet  01/23/16   [provider]  aspirin EC 81 MG tablet Take by mouth daily.    [provider]  atorvastatin (LIPITOR) 40 MG tablet Take 40 mg by mouth daily.    [provider]  carisoprodol (SOMA) 350 MG tablet Take 350 mg by mouth 4 (four) times daily  as needed for muscle spasms. Reported on 01/02/2016    [provider]  ciclopirox (LOPROX) 0.77 % cream  11/20/15   [provider]  doxazosin (CARDURA) 2 MG tablet Take 2 mg by mouth at bedtime.    [provider]  furosemide (LASIX) 80 MG tablet Take 80 mg by mouth. Take 1 tablet every AM. Take 1/2 tablet every PM    [provider]  gabapentin (NEURONTIN) 600 MG tablet Take 600 mg by mouth 3 (three) times daily.    [provider]  HYDROcodone-acetaminophen (NORCO/VICODIN) 5-325 MG tablet Take 1-2 tablets by mouth every 4 (four) hours as needed. 09/09/17   Virgel Manifold, MD  hydrOXYzine (ATARAX/VISTARIL) 10 MG tablet Take 10 mg by mouth 3 (three) times daily as needed.    [provider]  insulin aspart (NOVOLOG) 100 UNIT/ML injection Inject 15 Units into the skin every evening.    [provider]  Insulin Aspart Prot & Aspart (NOVOLOG MIX 70/30 [place]) Inject 30 Units into the skin every morning.    [provider]  IRON PO Take 65 mg by mouth daily.    [provider]  losartan (COZAAR) 100 MG tablet Take 100 mg by mouth daily.    [provider]  Multiple Vitamins-Minerals (CENTRUM SILVER ADULT 50+ PO) Take 1 tablet by mouth daily.    [provider]  nitroGLYCERIN (NITROSTAT) 0.4  MG SL tablet Place 0.4 mg under the tongue every 5 (five) minutes as needed for chest pain.    [provider]  potassium chloride (K-DUR) 10 MEQ tablet Take 10 mEq by mouth daily.    [provider]  Rivaroxaban 15 & 20 MG TBPK Take as directed on package: Start with one 15mg  tablet by mouth twice a day with food. On Day 22, switch to one 20mg  tablet once a day with food. 09/09/17   Virgel Manifold, MD  valACYclovir (VALTREX) 1000 MG tablet Take 1 tablet (1,000 mg total) by mouth 3 (three) times daily. 10/01/18   Deno Etienne, DO     No family history on file.  Social History   Socioeconomic History  .  Marital status: Married    Spouse name: Not on file  . Number of children: Not on file  . Years of education: Not on file  . Highest education level: Not on file  Occupational History  . Not on file  Social Needs  . Financial resource strain: Not on file  . Food insecurity    Worry: Not on file    Inability: Not on file  . Transportation needs    Medical: Not on file    Non-medical: Not on file  Tobacco Use  . Smoking status: Never Smoker  . Smokeless tobacco: Never Used  Substance and Sexual Activity  . Alcohol use: No    Alcohol/week: 0.0 standard drinks  . Drug use: No  . Sexual activity: Not on file  Lifestyle  . Physical activity    Days per week: Not on file    Minutes per session: Not on file  . Stress: Not on file  Relationships  . Social Herbalist on phone: Not on file    Gets together: Not on file    Attends religious service: Not on file    Active member of club or organization: Not on file    Attends meetings of clubs or organizations: Not on file    Relationship status: Not on file  Other Topics Concern  . Not on file  Social History Narrative  . Not on file     Review of Systems  Review of Systems: A 12 point ROS discussed and pertinent positives are indicated in the HPI above.  All other systems are negative.  Physical Exam No direct physical exam was performed, telephone visit only today because of Covid pandemic Vital Signs: There were no vitals taken for this visit.  Imaging: No results found.  Labs:  CBC: Recent Labs    11/11/18 2218  WBC 8.0  HGB 9.9*  HCT 33.4*  PLT 185    COAGS: No results for input(s): INR, APTT in the last 8760 hours.  BMP: Recent Labs    11/11/18 2218  NA 138  K 4.1  CL 102  CO2 30  GLUCOSE 145*  BUN 13  CALCIUM 9.1  CREATININE 1.22  GFRNONAA 60*  GFRAA >60    LIVER FUNCTION TESTS: Recent Labs    11/11/18 2218  BILITOT 0.6  AST 12*  ALT 12  ALKPHOS 93  PROT 6.8  ALBUMIN  3.6     Assessment and Plan:  1 month status post second CT-guided cryoablation of a renal cell carcinoma recurrence along the previous right renal ablation defect.  Original treatment was approximately 3 years ago.  The retreatment went very well.  He is recovered at home.  He does have chronic  back pain related to lumbar degenerative disc disease.  Overall he is back to baseline.  Plan: Repeat CT without and with contrast in 3 months around January or February 2021 at [facility].   Electronically Signed: Greggory Keen 07/06/2019, 2:58 PM   I spent a total of    25 Minutes in remote  clinical consultation, greater than 50% of which was counseling/coordinating care for this patient with right renal cell carcinoma recurrence.    Visit type: Audio only (telephone). Audio (no video) only due to patient's lack of internet/smartphone capability. Alternative for in-person consultation at [facility], [place] [address], [place], [place]. This visit type was conducted due to national recommendations for restrictions regarding the COVID-19 Pandemic (e.g. social distancing).  This format is felt to be most appropriate for this patient at this time.  All issues noted in this document were discussed and addressed.

## 2019-08-25 DIAGNOSIS — I480 Paroxysmal atrial fibrillation: Secondary | ICD-10-CM | POA: Insufficient documentation

## 2019-08-25 DIAGNOSIS — R0683 Snoring: Secondary | ICD-10-CM

## 2019-08-25 HISTORY — DX: Snoring: R06.83

## 2019-10-05 ENCOUNTER — Other Ambulatory Visit: Payer: Self-pay | Admitting: *Deleted

## 2019-10-05 DIAGNOSIS — C641 Malignant neoplasm of right kidney, except renal pelvis: Secondary | ICD-10-CM

## 2019-10-06 ENCOUNTER — Other Ambulatory Visit: Payer: Self-pay | Admitting: Interventional Radiology

## 2019-10-06 DIAGNOSIS — C641 Malignant neoplasm of right kidney, except renal pelvis: Secondary | ICD-10-CM

## 2019-10-26 ENCOUNTER — Other Ambulatory Visit: Payer: Self-pay

## 2019-10-26 ENCOUNTER — Encounter: Payer: Self-pay | Admitting: *Deleted

## 2019-10-26 ENCOUNTER — Ambulatory Visit
Admission: RE | Admit: 2019-10-26 | Discharge: 2019-10-26 | Disposition: A | Discharge status: Home / Self Care | Payer: Medicare HMO | Source: Ambulatory Visit | Attending: Interventional Radiology | Admitting: Interventional Radiology

## 2019-10-26 DIAGNOSIS — C641 Malignant neoplasm of right kidney, except renal pelvis: Secondary | ICD-10-CM

## 2019-10-26 HISTORY — PX: IR RADIOLOGIST EVAL & MGMT: IMG5224

## 2019-10-26 NOTE — Progress Notes (Signed)
Patient ID: Jason Jarvis, male   DOB: 1950/12/07, 69 y.o.   MRN: [id number]       Chief Complaint:  37-month status post repeat right cryoablation  Referring Physician(s): Eskew  History of Present Illness: Jason Jarvis is a 69 y.o. male who is now 17-month status post second CT-guided cryoablation of a right renal cell carcinoma recurrence along a previous ablation defect.  Procedure was performed in [facility] October 2020.  Overall he continues to do very well at home.  No significant flank or abdominal pain.  No urinary tract symptoms, hematuria or dysuria.  He is back to his baseline.  He still has chronic back pain related to lumbar degenerative disc disease.  Stable weight and appetite.  Follow-up imaging performed 10/24/2019.  Telephone visit today because of Covid pandemic to review surveillance imaging.  Past Medical History:  Diagnosis Date  . Arm DVT (deep venous thromboembolism), acute ([place])   . Chronic kidney disease   . Coronary artery disease   . Hypertension   . Myocardial infarction [facility])    pt states that he had a "light heart attack about 25 yrs ago"  . Pneumonia   . Stroke ([place])    approx 15 yrs ago    Past Surgical History:  Procedure Laterality Date  . IR GENERIC HISTORICAL  04/22/2016   IR RADIOLOGIST EVAL & MGMT 04/22/2016 Greggory Keen, MD [facility] INTERV RAD  . IR RADIOLOGIST EVAL & MGMT  05/18/2019  . IR RADIOLOGIST EVAL & MGMT  07/06/2019  . 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 PACEMAKER REMOVAL  07/2017    Allergies: Cephalexin, Lorazepam, Penicillins, Ace inhibitors, Prednisone, and Sulfa antibiotics  Medications: Prior to Admission medications   Medication Sig Start Date End Date Taking? Authorizing Provider  amLODipine (NORVASC) 2.5 MG tablet  01/23/16   [provider]  aspirin EC 81 MG tablet Take by mouth daily.    [provider]  atorvastatin (LIPITOR) 40 MG tablet Take 40 mg by mouth daily.    [provider]    carisoprodol (SOMA) 350 MG tablet Take 350 mg by mouth 4 (four) times daily as needed for muscle spasms. Reported on 01/02/2016    [provider]  ciclopirox (LOPROX) 0.77 % cream  11/20/15   [provider]  doxazosin (CARDURA) 2 MG tablet Take 2 mg by mouth at bedtime.    [provider]  furosemide (LASIX) 80 MG tablet Take 80 mg by mouth. Take 1 tablet every AM. Take 1/2 tablet every PM    [provider]  gabapentin (NEURONTIN) 600 MG tablet Take 600 mg by mouth 3 (three) times daily.    [provider]  HYDROcodone-acetaminophen (NORCO/VICODIN) 5-325 MG tablet Take 1-2 tablets by mouth every 4 (four) hours as needed. 09/09/17   Virgel Manifold, MD  hydrOXYzine (ATARAX/VISTARIL) 10 MG tablet Take 10 mg by mouth 3 (three) times daily as needed.    [provider]  insulin aspart (NOVOLOG) 100 UNIT/ML injection Inject 15 Units into the skin every evening.    [provider]  Insulin Aspart Prot & Aspart (NOVOLOG MIX 70/30 [place]) Inject 30 Units into the skin every morning.    [provider]  IRON PO Take 65 mg by mouth daily.    [provider]  losartan (COZAAR) 100 MG tablet Take 100 mg by mouth daily.    [provider]  Multiple Vitamins-Minerals (CENTRUM SILVER ADULT 50+ PO) Take 1 tablet by mouth daily.  [provider]  nitroGLYCERIN (NITROSTAT) 0.4 MG SL tablet Place 0.4 mg under the tongue every 5 (five) minutes as needed for chest pain.    [provider]  potassium chloride (K-DUR) 10 MEQ tablet Take 10 mEq by mouth daily.    [provider]  Rivaroxaban 15 & 20 MG TBPK Take as directed on package: Start with one 15mg  tablet by mouth twice a day with food. On Day 22, switch to one 20mg  tablet once a day with food. 09/09/17   Virgel Manifold, MD  valACYclovir (VALTREX) 1000 MG tablet Take 1 tablet (1,000 mg total) by mouth 3 (three) times daily. 10/01/18   Deno Etienne, DO      No family history on file.  Social History   Socioeconomic History  . Marital status: Married    Spouse name: Not on file  . Number of children: Not on file  . Years of education: Not on file  . Highest education level: Not on file  Occupational History  . Not on file  Tobacco Use  . Smoking status: Never Smoker  . Smokeless tobacco: Never Used  Substance and Sexual Activity  . Alcohol use: No    Alcohol/week: 0.0 standard drinks  . Drug use: No  . Sexual activity: Not on file  Other Topics Concern  . Not on file  Social History Narrative  . Not on file   Social Determinants of Health   Financial Resource Strain:   . Difficulty of Paying Living Expenses: Not on file  Food Insecurity:   . Worried About Charity fundraiser in the Last Year: Not on file  . Ran Out of Food in the Last Year: Not on file  Transportation Needs:   . Lack of Transportation (Medical): Not on file  . Lack of Transportation (Non-Medical): Not on file  Physical Activity:   . Days of Exercise per Week: Not on file  . Minutes of Exercise per Session: Not on file  Stress:   . Feeling of Stress : Not on file  Social Connections:   . Frequency of Communication with Friends and Family: Not on file  . Frequency of Social Gatherings with Friends and Family: Not on file  . Attends Religious Services: Not on file  . Active Member of Clubs or Organizations: Not on file  . Attends Archivist Meetings: Not on file  . Marital Status: Not on file     Review of Systems  Review of Systems: A 12 point ROS discussed and pertinent positives are indicated in the HPI above.  All other systems are negative.  Physical Exam No direct physical exam was performed, telephone health visit today only Vital Signs: There were no vitals taken for this visit.  Imaging: No results found.  Labs:  CBC: Recent Labs    11/11/18 2218  WBC 8.0  HGB 9.9*  HCT 33.4*  PLT 185    COAGS: No results for  input(s): INR, APTT in the last 8760 hours.  BMP: Recent Labs    11/11/18 2218  NA 138  K 4.1  CL 102  CO2 30  GLUCOSE 145*  BUN 13  CALCIUM 9.1  CREATININE 1.22  GFRNONAA 60*  GFRAA >60    LIVER FUNCTION TESTS: Recent Labs    11/11/18 2218  BILITOT 0.6  AST 12*  ALT 12  ALKPHOS 93  PROT 6.8  ALBUMIN 3.6    TUMOR MARKERS: No results for input(s): AFPTM, CEA, CA199, CHROMGRNA  in the last 8760 hours.  Assessment and Plan:  48-month status post second CT-guided cryoablation of a right renal cell carcinoma recurrence along a previous ablation defect.  24-month surveillance imaging demonstrates expected cryoablation changes along the superior aspect of the previous ablation defect.  No evidence of residual or recurrent disease.  No evidence of delayed complication or new finding.  Imaging findings discussed with the patient and his spouse by telephone today.  All questions addressed.  Plan: Repeat follow-up CT in 6 months at [facility].   Electronically Signed: Greggory Keen 10/26/2019, 11:54 AM   I spent a total of    25 Minutes in remote  clinical consultation, greater than 50% of which was counseling/coordinating care for this patient with right renal cell carcinoma.    Visit type: Audio only (telephone). Audio (no video) only due to patient's lack of internet/smartphone capability. Alternative for in-person consultation at [facility], [place], [place], [place]. This visit type was conducted due to national recommendations for restrictions regarding the COVID-19 Pandemic (e.g. social distancing).  This format is felt to be most appropriate for this patient at this time.  All issues noted in this document were discussed and addressed.

## 2020-04-03 ENCOUNTER — Other Ambulatory Visit: Payer: Self-pay | Admitting: Interventional Radiology

## 2020-04-03 ENCOUNTER — Other Ambulatory Visit: Payer: Self-pay

## 2020-04-03 DIAGNOSIS — C641 Malignant neoplasm of right kidney, except renal pelvis: Secondary | ICD-10-CM

## 2020-10-30 ENCOUNTER — Other Ambulatory Visit: Payer: Self-pay

## 2020-10-30 ENCOUNTER — Ambulatory Visit
Admission: RE | Admit: 2020-10-30 | Discharge: 2020-10-30 | Disposition: A | Discharge status: Home / Self Care | Payer: Medicare HMO | Source: Ambulatory Visit | Attending: Interventional Radiology | Admitting: Interventional Radiology

## 2020-10-30 ENCOUNTER — Encounter: Payer: Self-pay | Admitting: *Deleted

## 2020-10-30 DIAGNOSIS — C641 Malignant neoplasm of right kidney, except renal pelvis: Secondary | ICD-10-CM

## 2020-10-30 HISTORY — PX: IR RADIOLOGIST EVAL & MGMT: IMG5224

## 2020-10-30 NOTE — Progress Notes (Signed)
Patient ID: Jason Jarvis, male   DOB: 06-Aug-1951, 70 y.o.   MRN: [id number]       Chief Complaint:  Right renal cell carcinoma  Referring Physician(s): Eskew  History of Present Illness: Jason Jarvis is a 70 y.o. male who is now 1 year 19-month status post second CT-guided cryoablation of a right renal cell carcinoma recurrence along a previous ablation defect.  He continues to do very well at home.  No flank or abdominal pain.  No other urinary tract symptoms, hematuria, dysuria, or other significant illness.  No recent fever.  He has continued chronic back pain related to lumbar degenerative disc disease.  Stable weight and appetite.  Follow-up imaging performed 10/26/2020 at [facility] demonstrates stable right renal ablation defect with further regression at the treated site.  No signs of residual or recurrent disease.  No new significant renal abnormality.  Past Medical History:  Diagnosis Date  . Arm DVT (deep venous thromboembolism), acute ([place])   . Chronic kidney disease   . Coronary artery disease   . Hypertension   . Myocardial infarction [facility])    pt states that he had a "light heart attack about 25 yrs ago"  . Pneumonia   . Stroke ([place])    approx 15 yrs ago    Past Surgical History:  Procedure Laterality Date  . IR GENERIC HISTORICAL  04/22/2016   IR RADIOLOGIST EVAL & MGMT 04/22/2016 Greggory Keen, MD [facility] INTERV RAD  . IR RADIOLOGIST EVAL & MGMT  05/18/2019  . IR RADIOLOGIST EVAL & MGMT  07/06/2019  . IR RADIOLOGIST EVAL & MGMT  10/26/2019  . 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 PACEMAKER REMOVAL  07/2017    Allergies: Cephalexin, Lorazepam, Penicillins, Ace inhibitors, Prednisone, and Sulfa antibiotics  Medications: Prior to Admission medications   Medication Sig Start Date End Date Taking? Authorizing Provider  amLODipine (NORVASC) 2.5 MG tablet  01/23/16   [provider]  aspirin EC 81 MG tablet Take by mouth daily.    [provider]  atorvastatin (LIPITOR) 40 MG tablet Take 40 mg by mouth daily.    [provider]  carisoprodol (SOMA) 350 MG tablet Take 350 mg by mouth 4 (four) times daily as needed for muscle spasms. Reported on 01/02/2016    [provider]  ciclopirox (LOPROX) 0.77 % cream  11/20/15   [provider]  doxazosin (CARDURA) 2 MG tablet Take 2 mg by mouth at bedtime.    [provider]  furosemide (LASIX) 80 MG tablet Take 80 mg by mouth. Take 1 tablet every AM. Take 1/2 tablet every PM    [provider]  gabapentin (NEURONTIN) 600 MG tablet Take 600 mg by mouth 3 (three) times daily.    [provider]  HYDROcodone-acetaminophen (NORCO/VICODIN) 5-325 MG tablet Take 1-2 tablets by mouth every 4 (four) hours as needed. 09/09/17   Virgel Manifold, MD  hydrOXYzine (ATARAX/VISTARIL) 10 MG tablet Take 10 mg by mouth 3 (three) times daily as needed.    [provider]  insulin aspart (NOVOLOG) 100 UNIT/ML injection Inject 15 Units into the skin every evening.    [provider]  Insulin Aspart Prot & Aspart (NOVOLOG MIX 70/30 [place]) Inject 30 Units into the skin every morning.    [provider]  IRON PO Take 65 mg by mouth daily.    [provider]  losartan (COZAAR) 100 MG tablet Take 100 mg by mouth daily.  [provider]  Multiple Vitamins-Minerals (CENTRUM SILVER ADULT 50+ PO) Take 1 tablet by mouth daily.    [provider]  nitroGLYCERIN (NITROSTAT) 0.4 MG SL tablet Place 0.4 mg under the tongue every 5 (five) minutes as needed for chest pain.    [provider]  potassium chloride (K-DUR) 10 MEQ tablet Take 10 mEq by mouth daily.    [provider]  Rivaroxaban 15 & 20 MG TBPK Take as directed on package: Start with one 15mg  tablet by mouth twice a day with food. On Day 22, switch to one 20mg  tablet once a day with food. 09/09/17   Virgel Manifold, MD  valACYclovir (VALTREX)  1000 MG tablet Take 1 tablet (1,000 mg total) by mouth 3 (three) times daily. 10/01/18   Deno Etienne, DO     No family history on file.  Social History   Socioeconomic History  . Marital status: Married    Spouse name: Not on file  . Number of children: Not on file  . Years of education: Not on file  . Highest education level: Not on file  Occupational History  . Not on file  Tobacco Use  . Smoking status: Never Smoker  . Smokeless tobacco: Never Used  Substance and Sexual Activity  . Alcohol use: No    Alcohol/week: 0.0 standard drinks  . Drug use: No  . Sexual activity: Not on file  Other Topics Concern  . Not on file  Social History Narrative  . Not on file   Social Determinants of Health   Financial Resource Strain: Not on file  Food Insecurity: Not on file  Transportation Needs: Not on file  Physical Activity: Not on file  Stress: Not on file  Social Connections: Not on file     Review of Systems  Review of Systems: A 12 point ROS discussed and pertinent positives are indicated in the HPI above.  All other systems are negative.  Physical Exam No direct physical exam was performed, telephone health visit only today because of Covid pandemic Vital Signs: There were no vitals taken for this visit.  Imaging: No results found.  Labs:  CBC: No results for input(s): WBC, HGB, HCT, PLT in the last 8760 hours.  COAGS: No results for input(s): INR, APTT in the last 8760 hours.  BMP: No results for input(s): NA, K, CL, CO2, GLUCOSE, BUN, CALCIUM, CREATININE, GFRNONAA, GFRAA in the last 8760 hours.  Invalid input(s): CMP  LIVER FUNCTION TESTS: No results for input(s): BILITOT, AST, ALT, ALKPHOS, PROT, ALBUMIN in the last 8760 hours.  TUMOR MARKERS: No results for input(s): AFPTM, CEA, CA199, CHROMGRNA in the last 8760 hours.  Assessment and Plan:  1 year 62-month status post second CT-guided cryoablation of a right renal cell carcinoma recurrence along a  previous ablation defect.  Surveillance imaging demonstrates expected involution of cryoablation changes without evidence of residual or recurrent disease.  No new finding by CT.  Imaging 5 discussed with the patient and his spouse today by telephone.  They have a clear understanding of the findings.  All questions addressed.  Plan: Repeat follow-up CT in 1 [facility].  Electronically Signed: Greggory Keen 10/30/2020, 1:10 PM   I spent a total of    25 Minutes in remote  clinical consultation, greater than 50% of which was counseling/coordinating care for this patient with renal cell carcinoma status post ablation.    Visit type: Audio only (telephone). Audio (no video) only  due to patient's lack of internet/smartphone capability. Alternative for in-person consultation at [facility], [place] [place], [place], [place]. This visit type was conducted due to national recommendations for restrictions regarding the COVID-19 Pandemic (e.g. social distancing).  This format is felt to be most appropriate for this patient at this time.  All issues noted in this document were discussed and addressed.

## 2021-02-14 IMAGING — DX DG CHEST 2V
2 series · 2 of 2 positions shown · non-contrast
Comparison: CXR exams from 04/10/2018 and 03/29/2018

CLINICAL DATA: Flu like symptoms x2 days.  Chest pressure.

EXAM:
CHEST - 2 VIEW

[chest pa]
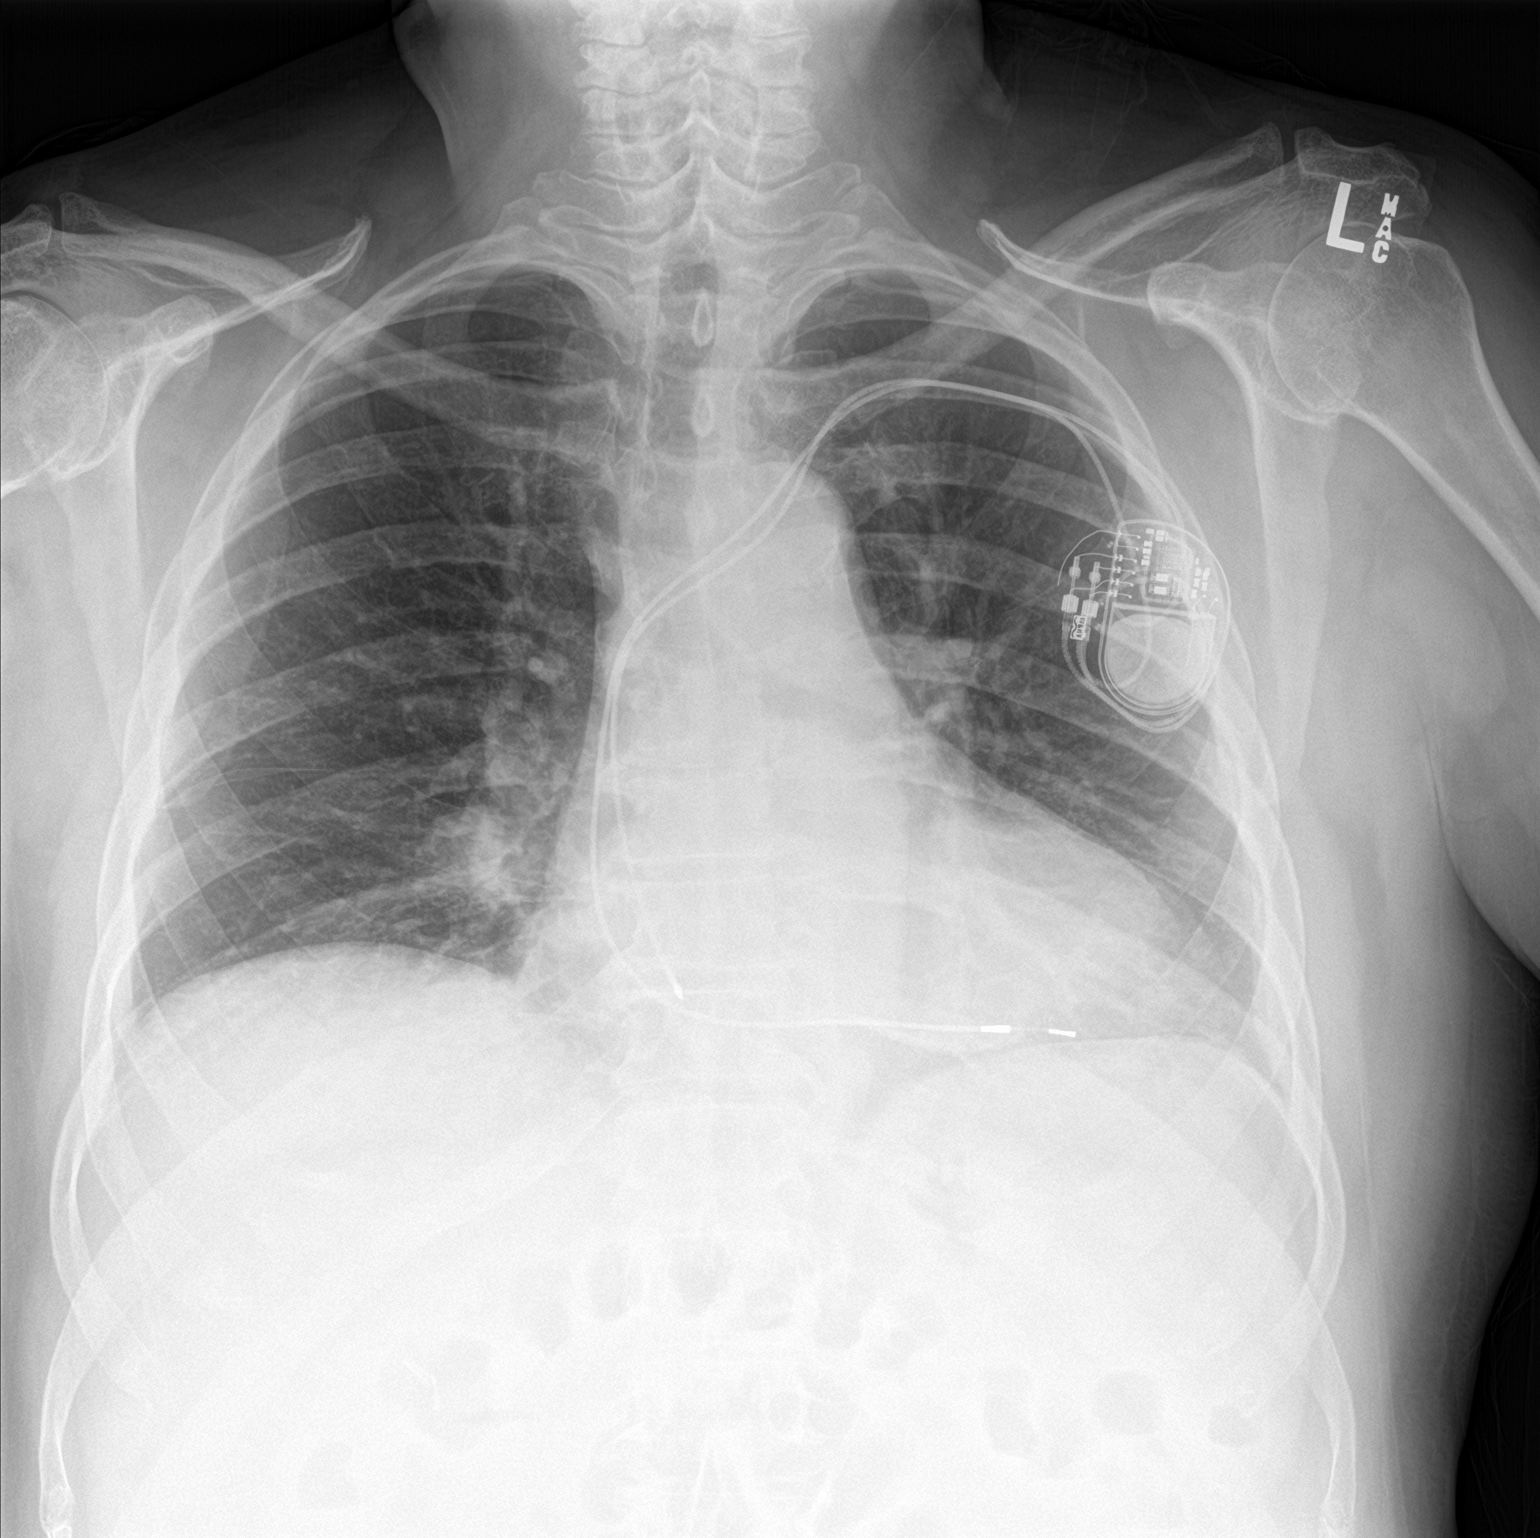

[chest lat]
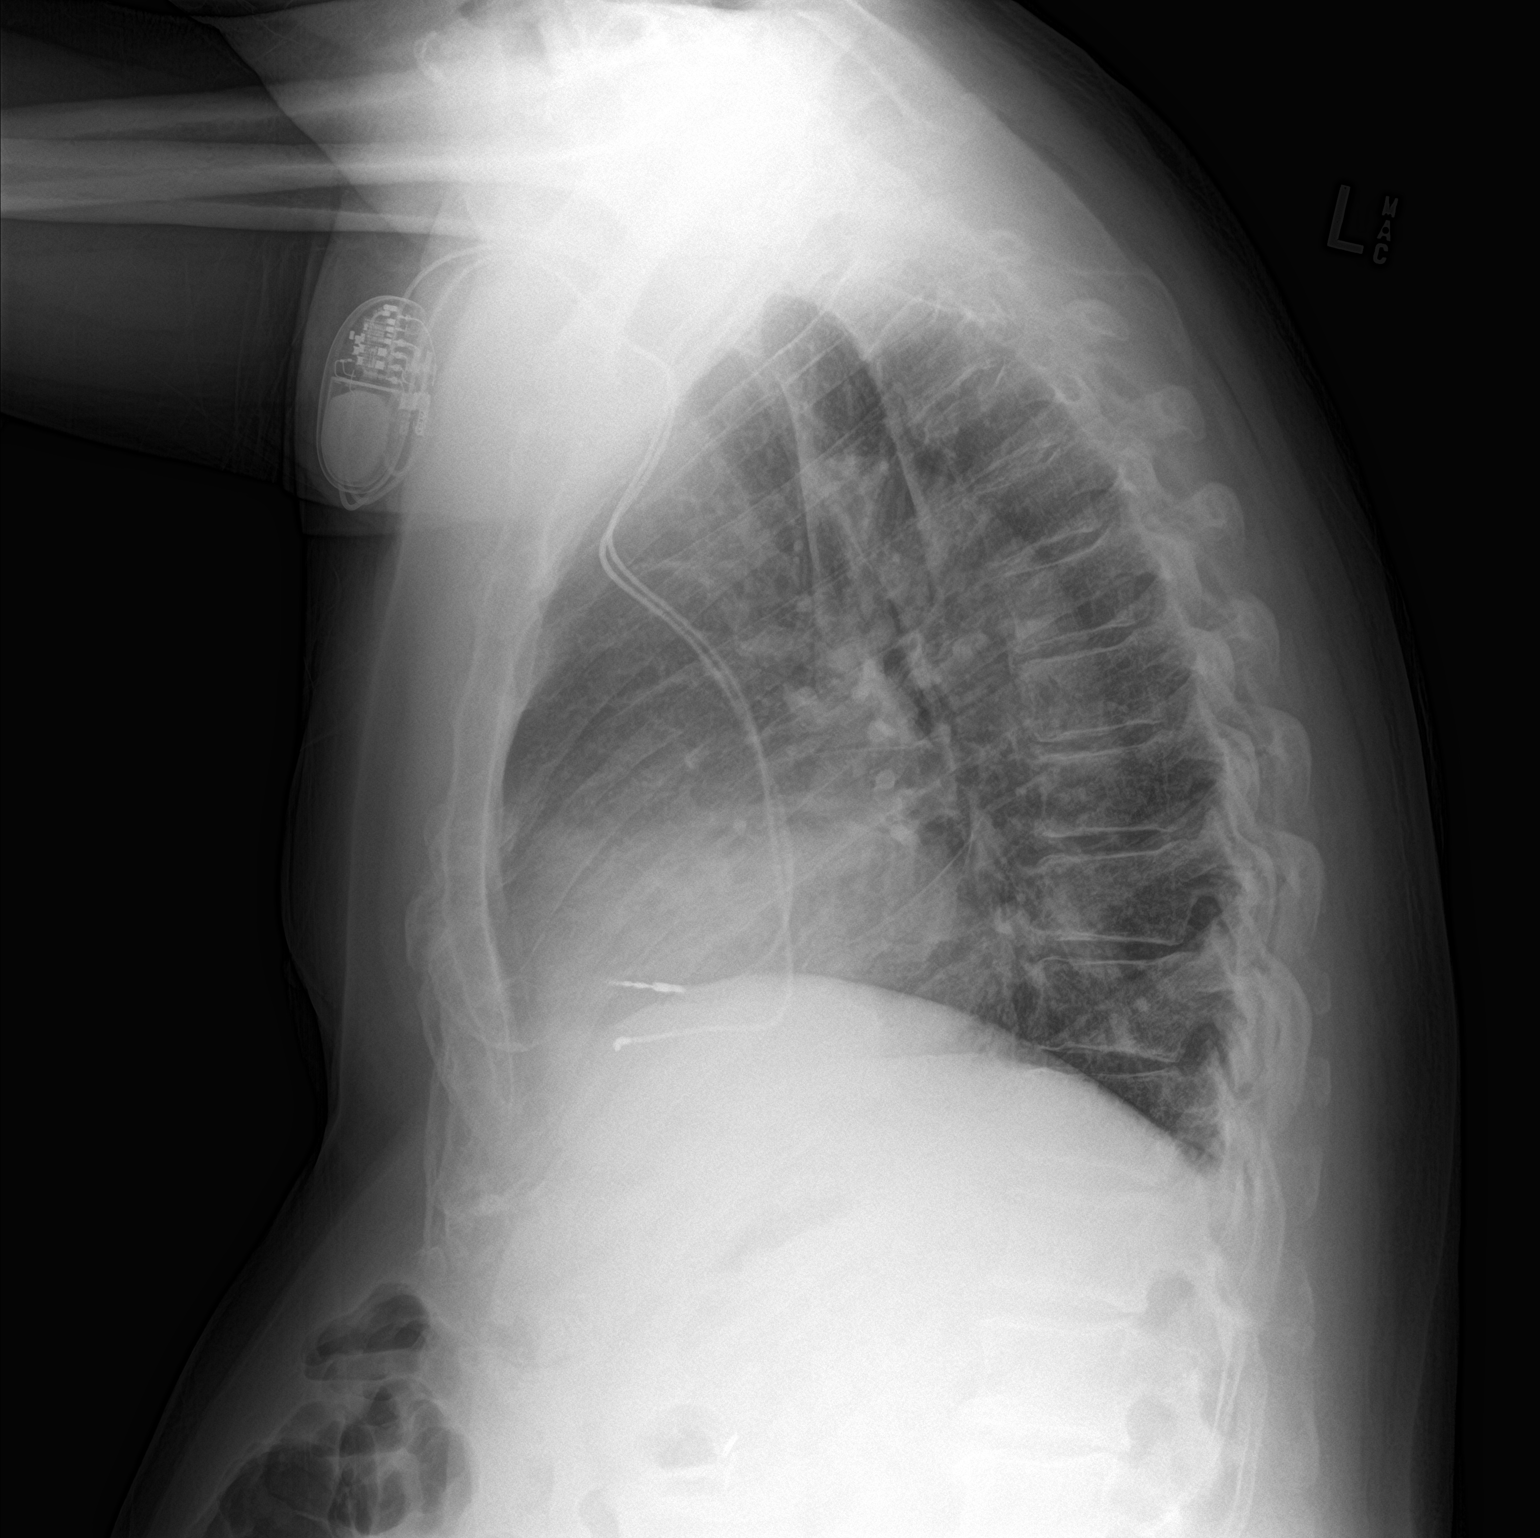

[2 of 2 positions shown; findings below may reference images not displayed]

FINDINGS: Borderline cardiomegaly with mild aortic atherosclerosis. No
aneurysm. No acute pulmonary consolidation, edema, effusion or
pneumothorax. Left-sided pacemaker apparatus with leads in the right
atrium and right ventricle are identified. Both lungs are clear.
Stable degenerative change along thoracic spine.
IMPRESSION: No active cardiopulmonary disease.

## 2021-03-20 DIAGNOSIS — M2041 Other hammer toe(s) (acquired), right foot: Secondary | ICD-10-CM | POA: Insufficient documentation

## 2021-07-04 DIAGNOSIS — E08621 Diabetes mellitus due to underlying condition with foot ulcer: Secondary | ICD-10-CM

## 2021-07-04 HISTORY — DX: Diabetes mellitus due to underlying condition with foot ulcer: E08.621

## 2021-10-09 ENCOUNTER — Other Ambulatory Visit: Payer: Self-pay | Admitting: Interventional Radiology

## 2021-10-09 ENCOUNTER — Other Ambulatory Visit: Payer: Self-pay

## 2021-10-09 DIAGNOSIS — N2889 Other specified disorders of kidney and ureter: Secondary | ICD-10-CM

## 2021-10-09 DIAGNOSIS — C641 Malignant neoplasm of right kidney, except renal pelvis: Secondary | ICD-10-CM

## 2021-10-14 DIAGNOSIS — J449 Chronic obstructive pulmonary disease, unspecified: Secondary | ICD-10-CM | POA: Insufficient documentation

## 2021-11-01 ENCOUNTER — Encounter: Payer: Self-pay | Admitting: *Deleted

## 2021-11-01 ENCOUNTER — Other Ambulatory Visit: Payer: Self-pay

## 2021-11-01 ENCOUNTER — Ambulatory Visit
Admission: RE | Admit: 2021-11-01 | Discharge: 2021-11-01 | Disposition: A | Discharge status: Home / Self Care | Payer: Medicare HMO | Source: Ambulatory Visit | Attending: Interventional Radiology | Admitting: Interventional Radiology

## 2021-11-01 DIAGNOSIS — C641 Malignant neoplasm of right kidney, except renal pelvis: Secondary | ICD-10-CM

## 2021-11-01 HISTORY — PX: IR RADIOLOGIST EVAL & MGMT: IMG5224

## 2021-11-01 NOTE — Progress Notes (Signed)
Patient ID: Jason Jarvis, male   DOB: 1951-08-30, 71 y.o.   MRN: [id number]       Chief Complaint:  Right renal cell carcinoma  Referring Physician(s): Eskew  History of Present Illness: Jason Jarvis is a 71 y.o. male who is now approximately 2 and half years status post second CT-guided cryoablation of a right renal cell carcinoma recurrence along a previous ablation defect.  From a renal standpoint he continues to do very well.  No flank or abdominal pain.  No other urinary tract symptoms including hematuria, dysuria, or other issues.  No recent illness or fevers.  Interval surveillance imaging performed at [facility] on 10/29/2021 confirms continued volume loss and retraction of the right renal ablation site.  No evidence of local recurrence.  No evidence of metastatic disease.  He continues to have progressive moderate to severe chronic back pain related to lumbar degenerative disc disease.  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  This appears to be the most severe level by CT.  For this he has been evaluated by chiropractor in the past.  Otherwise he has not had any prior back treatments or injections.  Past Medical History:  Diagnosis Date   Arm DVT (deep venous thromboembolism), acute ([place])    Chronic kidney disease    Coronary artery disease    Hypertension    Myocardial infarction [facility])    pt states that he had a "light heart attack about 25 yrs ago"   Pneumonia    Stroke ([place])    approx 15 yrs ago    Past Surgical History:  Procedure Laterality Date   IR GENERIC HISTORICAL  04/22/2016   IR RADIOLOGIST EVAL & MGMT 04/22/2016 Greggory Keen, MD [facility] INTERV RAD   IR RADIOLOGIST EVAL & MGMT  05/18/2019   IR RADIOLOGIST EVAL & MGMT  07/06/2019   IR RADIOLOGIST EVAL & MGMT  10/26/2019   IR  RADIOLOGIST EVAL & MGMT  10/30/2020   PACEMAKER IMPLANT  05/2017   PACEMAKER REMOVAL  07/2017    Allergies: Cephalexin, Lorazepam, Penicillins, Ace inhibitors, Prednisone, and Sulfa antibiotics  Medications: Prior to Admission medications   Medication Sig Start Date End Date Taking? Authorizing Provider  amLODipine (NORVASC) 2.5 MG tablet  01/23/16   [provider]  aspirin EC 81 MG tablet Take by mouth daily.    [provider]  atorvastatin (LIPITOR) 40 MG tablet Take 40 mg by mouth daily.    [provider]  carisoprodol (SOMA) 350 MG tablet Take 350 mg by mouth 4 (four) times daily as needed for muscle spasms. Reported on 01/02/2016    [provider]  ciclopirox (LOPROX) 0.77 % cream  11/20/15   [provider]  doxazosin (CARDURA) 2 MG tablet Take 2 mg by mouth at bedtime.    [provider]  furosemide (LASIX) 80 MG tablet Take 80 mg by mouth. Take 1 tablet every AM. Take 1/2 tablet every PM    [provider]  gabapentin (NEURONTIN) 600 MG tablet Take 600 mg by mouth 3 (three) times daily.    [provider]  HYDROcodone-acetaminophen (NORCO/VICODIN) 5-325 MG tablet Take 1-2 tablets by mouth every 4 (four) hours as needed. 09/09/17   Virgel Manifold, MD  hydrOXYzine (ATARAX/VISTARIL) 10 MG tablet Take 10 mg by mouth 3 (three) times daily as needed.  [provider]  insulin aspart (NOVOLOG) 100 UNIT/ML injection Inject 15 Units into the skin every evening.    [provider]  Insulin Aspart Prot & Aspart (NOVOLOG MIX 70/30 [place]) Inject 30 Units into the skin every morning.    [provider]  IRON PO Take 65 mg by mouth daily.    [provider]  losartan (COZAAR) 100 MG tablet Take 100 mg by mouth daily.    [provider]  Multiple Vitamins-Minerals (CENTRUM SILVER ADULT 50+ PO) Take 1 tablet by mouth daily.    [provider]  nitroGLYCERIN (NITROSTAT) 0.4 MG  SL tablet Place 0.4 mg under the tongue every 5 (five) minutes as needed for chest pain.    [provider]  potassium chloride (K-DUR) 10 MEQ tablet Take 10 mEq by mouth daily.    [provider]  Rivaroxaban 15 & 20 MG TBPK Take as directed on package: Start with one 15mg  tablet by mouth twice a day with food. On Day 22, switch to one 20mg  tablet once a day with food. 09/09/17   Virgel Manifold, MD  valACYclovir (VALTREX) 1000 MG tablet Take 1 tablet (1,000 mg total) by mouth 3 (three) times daily. 10/01/18   Deno Etienne, DO     No family history on file.  Social History   Socioeconomic History   Marital status: Married    Spouse name: Not on file   Number of children: Not on file   Years of education: Not on file   Highest education level: Not on file  Occupational History   Not on file  Tobacco Use   Smoking status: Never   Smokeless tobacco: Never  Substance and Sexual Activity   Alcohol use: No    Alcohol/week: 0.0 standard drinks   Drug use: No   Sexual activity: Not on file  Other Topics Concern   Not on file  Social History Narrative   Not on file   Social Determinants of Health   Financial Resource Strain: Not on file  Food Insecurity: Not on file  Transportation Needs: Not on file  Physical Activity: Not on file  Stress: Not on file  Social Connections: Not on file    Review of Systems: A 12 point ROS discussed and pertinent positives are indicated in the HPI above.  All other systems are negative.  Review of Systems  Vital Signs: BP (!) 191/88 (BP Location: Right Arm)    Pulse 67    SpO2 97%   Physical Exam Constitutional:      General: He is not in acute distress.    Appearance: He is not toxic-appearing or diaphoretic.  Eyes:     General: No scleral icterus.    Conjunctiva/sclera: Conjunctivae normal.  Cardiovascular:     Rate and Rhythm: Normal rate and regular rhythm.     Heart sounds: No murmur heard. Pulmonary:     Effort:  Pulmonary effort is normal. No respiratory distress.     Breath sounds: Normal breath sounds.  Abdominal:     General: Bowel sounds are normal. There is no distension.     Tenderness: There is no abdominal tenderness. There is no guarding.  Musculoskeletal:        General: No swelling.  Skin:    Coloration: Skin is not jaundiced.  Neurological:     Mental Status: He is alert. Mental status is at baseline.  Psychiatric:        Mood and Affect: Mood normal.  Imaging: No results found.  Labs:  CBC: No results for input(s): WBC, HGB, HCT, PLT in the last 8760 hours.  COAGS: No results for input(s): INR, APTT in the last 8760 hours.  BMP: No results for input(s): NA, K, CL, CO2, GLUCOSE, BUN, CALCIUM, CREATININE, GFRNONAA, GFRAA in the last 8760 hours.  Invalid input(s): CMP  LIVER FUNCTION TESTS: No results for input(s): BILITOT, AST, ALT, ALKPHOS, PROT, ALBUMIN in the last 8760 hours.  Assessment and Plan:  Approximately 2-1/2 years status post second CT-guided cryoablation of a right renal cell carcinoma recurrence along a previous ablation defect.  Surveillance imaging confirms continued retraction and involution of the ablation defect.  No signs of residual recurrent disease.  No new finding by CT.  Imaging reviewed with the patient and his spouse today in the office.  They have a clear understanding of the renal findings.  All questions related to his ablation treatments reviewed and discussed.  Plan: Repeat follow-up CT in 1 year at [facility].  For his progressive chronic back pain, recommend follow-up with his primary care physician Dr. Sheppard Coil in [place] for recommendations and guidance.    Electronically Signed: Greggory Keen 11/01/2021, 10:29 AM   I spent a total of    40 Minutes in face to face in clinical consultation, greater than 50% of which was counseling/coordinating care for this patient with right renal cell carcinoma  status post ablation

## 2021-12-21 ENCOUNTER — Emergency Department (HOSPITAL_BASED_OUTPATIENT_CLINIC_OR_DEPARTMENT_OTHER): Payer: Medicare HMO

## 2021-12-21 ENCOUNTER — Other Ambulatory Visit: Payer: Self-pay

## 2021-12-21 ENCOUNTER — Encounter (HOSPITAL_BASED_OUTPATIENT_CLINIC_OR_DEPARTMENT_OTHER): Payer: Self-pay

## 2021-12-21 ENCOUNTER — Inpatient Hospital Stay (HOSPITAL_BASED_OUTPATIENT_CLINIC_OR_DEPARTMENT_OTHER)
Admission: EM | Admit: 2021-12-21 | Discharge: 2022-01-01 | DRG: 260 | Disposition: A | Discharge status: Home Health | Payer: Medicare HMO | Attending: Family Medicine | Admitting: Family Medicine

## 2021-12-21 DIAGNOSIS — Z882 Allergy status to sulfonamides status: Secondary | ICD-10-CM

## 2021-12-21 DIAGNOSIS — N179 Acute kidney failure, unspecified: Secondary | ICD-10-CM | POA: Diagnosis present

## 2021-12-21 DIAGNOSIS — Z8673 Personal history of transient ischemic attack (TIA), and cerebral infarction without residual deficits: Secondary | ICD-10-CM

## 2021-12-21 DIAGNOSIS — Z8669 Personal history of other diseases of the nervous system and sense organs: Secondary | ICD-10-CM

## 2021-12-21 DIAGNOSIS — Z881 Allergy status to other antibiotic agents status: Secondary | ICD-10-CM

## 2021-12-21 DIAGNOSIS — I5032 Chronic diastolic (congestive) heart failure: Secondary | ICD-10-CM | POA: Diagnosis present

## 2021-12-21 DIAGNOSIS — Z20822 Contact with and (suspected) exposure to covid-19: Secondary | ICD-10-CM | POA: Diagnosis present

## 2021-12-21 DIAGNOSIS — E871 Hypo-osmolality and hyponatremia: Secondary | ICD-10-CM

## 2021-12-21 DIAGNOSIS — Z794 Long term (current) use of insulin: Secondary | ICD-10-CM

## 2021-12-21 DIAGNOSIS — R350 Frequency of micturition: Secondary | ICD-10-CM | POA: Diagnosis present

## 2021-12-21 DIAGNOSIS — E1165 Type 2 diabetes mellitus with hyperglycemia: Secondary | ICD-10-CM | POA: Diagnosis present

## 2021-12-21 DIAGNOSIS — Z7982 Long term (current) use of aspirin: Secondary | ICD-10-CM

## 2021-12-21 DIAGNOSIS — Z87442 Personal history of urinary calculi: Secondary | ICD-10-CM

## 2021-12-21 DIAGNOSIS — Y831 Surgical operation with implant of artificial internal device as the cause of abnormal reaction of the patient, or of later complication, without mention of misadventure at the time of the procedure: Secondary | ICD-10-CM | POA: Diagnosis present

## 2021-12-21 DIAGNOSIS — I482 Chronic atrial fibrillation, unspecified: Secondary | ICD-10-CM

## 2021-12-21 DIAGNOSIS — E119 Type 2 diabetes mellitus without complications: Secondary | ICD-10-CM

## 2021-12-21 DIAGNOSIS — A419 Sepsis, unspecified organism: Secondary | ICD-10-CM | POA: Diagnosis present

## 2021-12-21 DIAGNOSIS — J449 Chronic obstructive pulmonary disease, unspecified: Secondary | ICD-10-CM | POA: Diagnosis present

## 2021-12-21 DIAGNOSIS — G9341 Metabolic encephalopathy: Secondary | ICD-10-CM

## 2021-12-21 DIAGNOSIS — I480 Paroxysmal atrial fibrillation: Secondary | ICD-10-CM | POA: Insufficient documentation

## 2021-12-21 DIAGNOSIS — R4182 Altered mental status, unspecified: Secondary | ICD-10-CM | POA: Diagnosis present

## 2021-12-21 DIAGNOSIS — B952 Enterococcus as the cause of diseases classified elsewhere: Secondary | ICD-10-CM

## 2021-12-21 DIAGNOSIS — I5023 Acute on chronic systolic (congestive) heart failure: Secondary | ICD-10-CM

## 2021-12-21 DIAGNOSIS — L7632 Postprocedural hematoma of skin and subcutaneous tissue following other procedure: Secondary | ICD-10-CM | POA: Diagnosis not present

## 2021-12-21 DIAGNOSIS — Z86718 Personal history of other venous thrombosis and embolism: Secondary | ICD-10-CM

## 2021-12-21 DIAGNOSIS — E861 Hypovolemia: Secondary | ICD-10-CM | POA: Diagnosis present

## 2021-12-21 DIAGNOSIS — N182 Chronic kidney disease, stage 2 (mild): Secondary | ICD-10-CM | POA: Diagnosis present

## 2021-12-21 DIAGNOSIS — I252 Old myocardial infarction: Secondary | ICD-10-CM

## 2021-12-21 DIAGNOSIS — A4181 Sepsis due to Enterococcus: Secondary | ICD-10-CM | POA: Diagnosis present

## 2021-12-21 DIAGNOSIS — F32A Depression, unspecified: Secondary | ICD-10-CM | POA: Diagnosis present

## 2021-12-21 DIAGNOSIS — Z79899 Other long term (current) drug therapy: Secondary | ICD-10-CM

## 2021-12-21 DIAGNOSIS — Z96653 Presence of artificial knee joint, bilateral: Secondary | ICD-10-CM | POA: Diagnosis present

## 2021-12-21 DIAGNOSIS — I10 Essential (primary) hypertension: Secondary | ICD-10-CM

## 2021-12-21 DIAGNOSIS — I130 Hypertensive heart and chronic kidney disease with heart failure and stage 1 through stage 4 chronic kidney disease, or unspecified chronic kidney disease: Secondary | ICD-10-CM | POA: Diagnosis present

## 2021-12-21 DIAGNOSIS — I495 Sick sinus syndrome: Secondary | ICD-10-CM | POA: Diagnosis present

## 2021-12-21 DIAGNOSIS — Z8614 Personal history of Methicillin resistant Staphylococcus aureus infection: Secondary | ICD-10-CM

## 2021-12-21 DIAGNOSIS — I5022 Chronic systolic (congestive) heart failure: Secondary | ICD-10-CM | POA: Insufficient documentation

## 2021-12-21 DIAGNOSIS — Z9049 Acquired absence of other specified parts of digestive tract: Secondary | ICD-10-CM

## 2021-12-21 DIAGNOSIS — Z8249 Family history of ischemic heart disease and other diseases of the circulatory system: Secondary | ICD-10-CM

## 2021-12-21 DIAGNOSIS — I2510 Atherosclerotic heart disease of native coronary artery without angina pectoris: Secondary | ICD-10-CM

## 2021-12-21 DIAGNOSIS — T827XXA Infection and inflammatory reaction due to other cardiac and vascular devices, implants and grafts, initial encounter: Principal | ICD-10-CM

## 2021-12-21 DIAGNOSIS — N12 Tubulo-interstitial nephritis, not specified as acute or chronic: Principal | ICD-10-CM

## 2021-12-21 DIAGNOSIS — F419 Anxiety disorder, unspecified: Secondary | ICD-10-CM | POA: Diagnosis present

## 2021-12-21 DIAGNOSIS — Z880 Allergy status to penicillin: Secondary | ICD-10-CM

## 2021-12-21 DIAGNOSIS — N5082 Scrotal pain: Secondary | ICD-10-CM

## 2021-12-21 DIAGNOSIS — R6520 Severe sepsis without septic shock: Secondary | ICD-10-CM | POA: Diagnosis present

## 2021-12-21 DIAGNOSIS — E1122 Type 2 diabetes mellitus with diabetic chronic kidney disease: Secondary | ICD-10-CM | POA: Diagnosis present

## 2021-12-21 DIAGNOSIS — R7881 Bacteremia: Secondary | ICD-10-CM | POA: Insufficient documentation

## 2021-12-21 DIAGNOSIS — I824Z2 Acute embolism and thrombosis of unspecified deep veins of left distal lower extremity: Secondary | ICD-10-CM

## 2021-12-21 DIAGNOSIS — I82441 Acute embolism and thrombosis of right tibial vein: Secondary | ICD-10-CM | POA: Diagnosis present

## 2021-12-21 DIAGNOSIS — Z950 Presence of cardiac pacemaker: Secondary | ICD-10-CM

## 2021-12-21 DIAGNOSIS — Z7901 Long term (current) use of anticoagulants: Secondary | ICD-10-CM

## 2021-12-21 DIAGNOSIS — C641 Malignant neoplasm of right kidney, except renal pelvis: Secondary | ICD-10-CM | POA: Diagnosis present

## 2021-12-21 DIAGNOSIS — Z888 Allergy status to other drugs, medicaments and biological substances status: Secondary | ICD-10-CM

## 2021-12-21 HISTORY — DX: Presence of cardiac pacemaker: Z95.0

## 2021-12-21 LAB — CBC
HCT: 37.9 % — ABNORMAL LOW (ref 39.0–52.0)
Hemoglobin: 12.3 g/dL — ABNORMAL LOW (ref 13.0–17.0)
MCH: 26.9 pg (ref 26.0–34.0)
MCHC: 32.5 g/dL (ref 30.0–36.0)
MCV: 82.8 fL (ref 80.0–100.0)
Platelets: 157 10*3/uL (ref 150–400)
RBC: 4.58 MIL/uL (ref 4.22–5.81)
RDW: 15.3 % (ref 11.5–15.5)
WBC: 15.1 10*3/uL — ABNORMAL HIGH (ref 4.0–10.5)
nRBC: 0 % (ref 0.0–0.2)

## 2021-12-21 LAB — COMPREHENSIVE METABOLIC PANEL
ALT: 20 U/L (ref 0–44)
AST: 19 U/L (ref 15–41)
Albumin: 3.5 g/dL (ref 3.5–5.0)
Alkaline Phosphatase: 86 U/L (ref 38–126)
Anion gap: 8 (ref 5–15)
BUN: 20 mg/dL (ref 8–23)
CO2: 25 mmol/L (ref 22–32)
Calcium: 8.3 mg/dL — ABNORMAL LOW (ref 8.9–10.3)
Chloride: 99 mmol/L (ref 98–111)
Creatinine, Ser: 1.42 mg/dL — ABNORMAL HIGH (ref 0.61–1.24)
GFR, Estimated: 52 mL/min — ABNORMAL LOW (ref >60)
Glucose, Bld: 153 mg/dL — ABNORMAL HIGH (ref 70–99)
Potassium: 3.8 mmol/L (ref 3.5–5.1)
Sodium: 132 mmol/L — ABNORMAL LOW (ref 135–145)
Total Bilirubin: 1.2 mg/dL (ref 0.3–1.2)
Total Protein: 6.5 g/dL (ref 6.5–8.1)

## 2021-12-21 LAB — RESP PANEL BY RT-PCR (FLU A&B, COVID) ARPGX2
Influenza A by PCR: NEGATIVE
Influenza B by PCR: NEGATIVE
SARS Coronavirus 2 by RT PCR: NEGATIVE

## 2021-12-21 LAB — URINALYSIS, ROUTINE W REFLEX MICROSCOPIC
Bilirubin Urine: NEGATIVE
Glucose, UA: NEGATIVE mg/dL
Hgb urine dipstick: NEGATIVE
Ketones, ur: NEGATIVE mg/dL
Leukocytes,Ua: NEGATIVE
Nitrite: NEGATIVE
Protein, ur: 30 mg/dL — AB
Specific Gravity, Urine: 1.01 (ref 1.005–1.030)
pH: 6.5 (ref 5.0–8.0)

## 2021-12-21 LAB — URINALYSIS, MICROSCOPIC (REFLEX)

## 2021-12-21 LAB — LACTIC ACID, PLASMA: Lactic Acid, Venous: 1.2 mmol/L (ref 0.5–1.9)

## 2021-12-21 MED ORDER — SODIUM CHLORIDE 0.9 % IV SOLN: INTRAVENOUS | Status: DC | PRN

## 2021-12-21 MED ORDER — ACETAMINOPHEN 325 MG PO TABS
650.0000 mg | ORAL_TABLET | Freq: Once | ORAL | Status: AC
  Administered 2021-12-21: 650 mg via ORAL
  Filled 2021-12-21: qty 2

## 2021-12-21 MED ORDER — SODIUM CHLORIDE 0.9 % IV SOLN
1.0000 g | Freq: Once | INTRAVENOUS | Status: AC
  Administered 2021-12-21: 1 g via INTRAVENOUS

## 2021-12-21 MED ORDER — SODIUM CHLORIDE 0.9 % IV BOLUS
1000.0000 mL | Freq: Once | INTRAVENOUS | Status: AC
  Administered 2021-12-21: 1000 mL via INTRAVENOUS

## 2021-12-21 MED ORDER — IOHEXOL 300 MG/ML  SOLN
100.0000 mL | Freq: Once | INTRAMUSCULAR | Status: AC | PRN
  Administered 2021-12-21: 100 mL via INTRAVENOUS

## 2021-12-21 MED ORDER — SODIUM CHLORIDE 0.9 % IV SOLN
1.0000 g | Freq: Three times a day (TID) | INTRAVENOUS | Status: DC
  Administered 2021-12-22 (×2): 1 g via INTRAVENOUS
  Filled 2021-12-21 (×3): qty 20

## 2021-12-21 NOTE — ED Provider Notes (Signed)
Blood pressure 132/65, pulse 67, temperature 99.8 ?F (37.7 ?C), temperature source Oral, resp. rate (!) 26, height '5\' 9"'$  (1.753 m), weight 91.2 kg, SpO2 95 %. ? ?Assuming care from Dr. Darl Householder.  In short, Jason Jarvis is a 71 y.o. male with a chief complaint of Fever and Urinary Frequency ?Marland Kitchen  Refer to the original H&P for additional details. ? ?The current plan of care is to follow up on UA and admit. ? ?11:26 PM  ?UA with rare bacteria but no other evidence of UTI. Patient assessed. Symptoms are mainly urinary with some associated cough and congestion. No HA. Plan for admit to continue abx and follow cultures. Patient awake and alert here. Wife reports improved confusion compared to prior. Very low suspicion for CNS infection. Patient's med list reviewed including Rivaroxaban.  ? ?Discussed patient's case with [facility], Dr. Alcario Drought to request admission. Patient and family (if present) updated with plan. ? ?I reviewed all nursing notes, vitals, pertinent old records, EKGs, labs, imaging (as available). ? ? ?  ?Margette Fast, MD ?12/22/21 0036 ? ?

## 2021-12-21 NOTE — ED Triage Notes (Addendum)
Wife reports patient has increased urinary frequency and inability to control his bladder recently. She states lastnight he was febrile and feeling weak. Patient states that he is hurting all over.  Wife states that today is is altered and sleepy. Patient stakes daily ASA.  ?

## 2021-12-21 NOTE — ED Notes (Signed)
Wife is leaving for the night. Will return in the morning.  ?

## 2021-12-21 NOTE — Progress Notes (Signed)
Pharmacy Antibiotic Note ? ?Jason Jarvis is a 71 y.o. male admitted on 12/21/2021 with bacteremia.  Pharmacy has been consulted for meropenem dosing. ? ?WBC elevated, SCr 1.42 ? ?Plan: ?-Meropenem 1 gm IV Q 8 hours ?-Monitor CBC, renal fx, cultures and clinical progress ? ?Height: '5\' 9"'$  (175.3 cm) ?Weight: 91.2 kg (201 lb) ?IBW/kg (Calculated) : 70.7 ? ?Temp (24hrs), Avg:102.6 ?F (39.2 ?C), Min:102.6 ?F (39.2 ?C), Max:102.6 ?F (39.2 ?C) ? ?Recent Labs  ?Lab 12/21/21 ?2033  ?WBC 15.1*  ?  ?CrCl cannot be calculated (Patient's most recent lab result is older than the maximum 21 days allowed.).   ? ?Allergies  ?Allergen Reactions  ? Cephalexin Hives  ? Lorazepam Other (See Comments)  ?  Hallucinated / Confused / Combative  ? Penicillins Hives  ? Ace Inhibitors Cough  ?   ?  ? Prednisone Rash  ? Sulfa Antibiotics Rash  ? ? ?Antimicrobials this admission: ?Meropenem 4/15 >>  ? ?Dose adjustments this admission: ? ? ?Microbiology results: ?4/15 BCx:  ?4/15 UCx:   ? ? ?Thank you for allowing pharmacy to be a part of this patient?s care. ? ?Albertina Parr, PharmD., BCCCP ?Clinical Pharmacist ?Please refer to [facility] for unit-specific pharmacist  ? ? ?

## 2021-12-21 NOTE — ED Provider Notes (Signed)
?[place] EMERGENCY DEPARTMENT ?Provider Note ? ? ?CSN: [id number] ?Arrival date & time: 12/21/21  1940 ? ?  ? ?History ? ?Chief Complaint  ?Patient presents with  ? Fever  ? Urinary Frequency  ? ? ?Jason Jarvis is a 71 y.o. male history of COPD, CAD previous left kidney stone, here presenting with altered mental status and flank pain and fever.  Patient works night shift and came home this morning.  Wife noticed that he felt warm.  He was also very confused when he came home. Patient also complains of flank pain and has been urinating frequently.  He also had several episodes of urinating on himself.  Patient has a history of kidney stone that required lithotripsy previously.  Patient was noted to be very confused and sleepy as well. ? ?The history is provided by the patient.  ? ?  ? ?Home Medications ?Prior to Admission medications   ?Medication Sig Start Date End Date Taking? Authorizing Provider  ?amLODipine (NORVASC) 2.5 MG tablet  01/23/16   [provider]  ?aspirin EC 81 MG tablet Take by mouth daily.    [provider]  ?atorvastatin (LIPITOR) 40 MG tablet Take 40 mg by mouth daily.    [provider]  ?carisoprodol (SOMA) 350 MG tablet Take 350 mg by mouth 4 (four) times daily as needed for muscle spasms. Reported on 01/02/2016    [provider]  ?ciclopirox (LOPROX) 0.77 % cream  11/20/15   [provider]  ?doxazosin (CARDURA) 2 MG tablet Take 2 mg by mouth at bedtime.    [provider]  ?furosemide (LASIX) 80 MG tablet Take 80 mg by mouth. Take 1 tablet every AM. ?Take 1/2 tablet every PM    [provider]  ?gabapentin (NEURONTIN) 600 MG tablet Take 600 mg by mouth 3 (three) times daily.    [provider]  ?HYDROcodone-acetaminophen (NORCO/VICODIN) 5-325 MG tablet Take 1-2 tablets by mouth every 4 (four) hours as needed. 09/09/17   Virgel Manifold, MD  ?hydrOXYzine (ATARAX/VISTARIL) 10 MG tablet Take 10 mg by mouth 3  (three) times daily as needed.    [provider]  ?insulin aspart (NOVOLOG) 100 UNIT/ML injection Inject 15 Units into the skin every evening.    [provider]  ?Insulin Aspart Prot & Aspart (NOVOLOG MIX 70/30 [place]) Inject 30 Units into the skin every morning.    [provider]  ?IRON PO Take 65 mg by mouth daily.    [provider]  ?losartan (COZAAR) 100 MG tablet Take 100 mg by mouth daily.    [provider]  ?Multiple Vitamins-Minerals (CENTRUM SILVER ADULT 50+ PO) Take 1 tablet by mouth daily.    [provider]  ?nitroGLYCERIN (NITROSTAT) 0.4 MG SL tablet Place 0.4 mg under the tongue every 5 (five) minutes as needed for chest pain.    [provider]  ?potassium chloride (K-DUR) 10 MEQ tablet Take 10 mEq by mouth daily.    [provider]  ?Rivaroxaban 15 & 20 MG TBPK Take as directed on package: Start with one '15mg'$  tablet by mouth twice a day with food. On Day 22, switch to one '20mg'$  tablet once a day with food. 09/09/17   Virgel Manifold, MD  ?valACYclovir (VALTREX) 1000 MG tablet Take 1 tablet (1,000 mg total) by mouth 3 (three) times daily. 10/01/18   Deno Etienne, DO  ?   ? ?Allergies    ?Cephalexin, Lorazepam, Penicillins, Ace inhibitors, Prednisone, and Sulfa  antibiotics   ? ?Review of Systems   ?Review of Systems  ?Constitutional:  Positive for fever.  ?Genitourinary:  Positive for frequency.  ?All other systems reviewed and are negative. ? ?Physical Exam ?Updated Vital Signs ?BP (!) 141/63 (BP Location: Right Arm)   Pulse 60   Temp (!) 102.6 ?F (39.2 ?C) (Oral)   Resp (!) 24   Ht '5\' 9"'$  (1.753 m)   Wt 91.2 kg   SpO2 97%   BMI 29.68 kg/m?  ?Physical Exam ?Vitals and nursing note reviewed.  ?Constitutional:   ?   Comments: Chronically ill, confused, falls asleep during exam  ?HENT:  ?   Head: Normocephalic.  ?   Nose: Nose normal.  ?   Mouth/Throat:  ?   Mouth: Mucous membranes are dry.  ?Eyes:  ?   Extraocular Movements:  Extraocular movements intact.  ?   Pupils: Pupils are equal, round, and reactive to light.  ?Neck:  ?   Comments: No meningeal signs ?Cardiovascular:  ?   Rate and Rhythm: Normal rate and regular rhythm.  ?   Pulses: Normal pulses.  ?   Heart sounds: Normal heart sounds.  ?Pulmonary:  ?   Effort: Pulmonary effort is normal.  ?   Breath sounds: Normal breath sounds.  ?Abdominal:  ?   General: Abdomen is flat.  ?   Palpations: Abdomen is soft.  ?   Comments: Mild left CVA tenderness  ?Musculoskeletal:     ?   General: Normal range of motion.  ?   Cervical back: Normal range of motion and neck supple.  ?Skin: ?   General: Skin is warm.  ?   Capillary Refill: Capillary refill takes less than 2 seconds.  ?Neurological:  ?   Comments: ANO x2.  Patient has normal strength and sensation bilateral arms and legs   ?Psychiatric:     ?   Mood and Affect: Mood normal.  ? ? ?ED Results / Procedures / Treatments   ?Labs ?(all labs ordered are listed, but only abnormal results are displayed) ?Labs Reviewed  ?URINE CULTURE  ?CULTURE, BLOOD (ROUTINE X 2)  ?CULTURE, BLOOD (ROUTINE X 2)  ?RESP PANEL BY RT-PCR (FLU A&B, COVID) ARPGX2  ?URINALYSIS, ROUTINE W REFLEX MICROSCOPIC  ?CBC  ?LACTIC ACID, PLASMA  ?LACTIC ACID, PLASMA  ?COMPREHENSIVE METABOLIC PANEL  ? ? ?EKG ?EKG Interpretation ? ?Date/Time:  Saturday December 21 2021 19:55:41 EDT ?Ventricular Rate:  61 ?PR Interval:  176 ?QRS Duration: 142 ?QT Interval:  418 ?QTC Calculation: 420 ?R Axis:   -71 ?Text Interpretation: AV dual-paced rhythm Abnormal ECG When compared with ECG of 15-Jul-2015 21:59, paced rhythm new since previous Confirmed by Wandra Arthurs [phone]) on 12/21/2021 8:04:06 PM ? ?Radiology ?No results found. ? ?Procedures ?Procedures  ? ? ?CRITICAL CARE ?Performed by: Wandra Arthurs ? ? ?Total critical care time: 30 minutes ? ?Critical care time was exclusive of separately billable procedures and treating other patients. ? ?Critical care was necessary to treat or prevent  imminent or life-threatening deterioration. ? ?Critical care was time spent personally by me on the following activities: development of treatment plan with patient and/or surrogate as well as nursing, discussions with consultants, evaluation of patient's response to treatment, examination of patient, obtaining history from patient or surrogate, ordering and performing treatments and interventions, ordering and review of laboratory studies, ordering and review of radiographic studies, pulse oximetry and re-evaluation of patient's condition. ? ? ?Medications Ordered in ED ?Medications  ?acetaminophen (TYLENOL) tablet  650 mg (has no administration in time range)  ?sodium chloride 0.9 % bolus 1,000 mL (has no administration in time range)  ? ? ?ED Course/ Medical Decision Making/ A&P ?  ?                        ?Medical Decision Making ?Jason Jarvis is a 71 y.o. male here presenting with confusion and urinary frequency.  Patient has a history of kidney stones.  Concern for possible sepsis from infected kidney stone versus pyelonephritis versus pneumonia versus COVID versus intra-abdominal infection.  Plan to get CBC and CMP and lactate and cultures and UA and urine culture.  We will also get CT abdomen pelvis and CT head.  Will hydrate patient and likely will give broad-spectrum antibiotics and patient will need admission ? ?10:59 PM ?WBC is 15. Cr 1.4, baseline 1.2. CT ab/pel showed no pyelo or abscess, but stable R renal cell carcinoma.  Still confused. Given meropenem (has PCN, cephalosporin allergy). UA pending. Signed out to Dr. Laverta Baltimore, anticipate admission.  ? ?Problems Addressed: ?Pyelonephritis: acute illness or injury ?Sepsis, due to unspecified organism, unspecified whether acute organ dysfunction present [facility]): acute illness or injury ? ?Amount and/or Complexity of Data Reviewed ?Labs: ordered. Decision-making details documented in ED Course. ?Radiology: ordered and independent interpretation performed.  Decision-making details documented in ED Course. ? ?Risk ?OTC drugs. ?Prescription drug management. ? ? ?Final Clinical Impression(s) / ED Diagnoses ?Final diagnoses:  ?None  ? ? ?Rx / DC Orders ?ED Discharge Orders   ? ? None  ? ?

## 2021-12-22 ENCOUNTER — Inpatient Hospital Stay (HOSPITAL_COMMUNITY): Payer: Medicare HMO

## 2021-12-22 ENCOUNTER — Observation Stay (HOSPITAL_COMMUNITY): Payer: Medicare HMO

## 2021-12-22 DIAGNOSIS — I130 Hypertensive heart and chronic kidney disease with heart failure and stage 1 through stage 4 chronic kidney disease, or unspecified chronic kidney disease: Secondary | ICD-10-CM | POA: Diagnosis present

## 2021-12-22 DIAGNOSIS — I482 Chronic atrial fibrillation, unspecified: Secondary | ICD-10-CM | POA: Diagnosis not present

## 2021-12-22 DIAGNOSIS — E871 Hypo-osmolality and hyponatremia: Secondary | ICD-10-CM

## 2021-12-22 DIAGNOSIS — Z7982 Long term (current) use of aspirin: Secondary | ICD-10-CM | POA: Diagnosis not present

## 2021-12-22 DIAGNOSIS — R6520 Severe sepsis without septic shock: Secondary | ICD-10-CM

## 2021-12-22 DIAGNOSIS — I495 Sick sinus syndrome: Secondary | ICD-10-CM | POA: Diagnosis present

## 2021-12-22 DIAGNOSIS — T827XXA Infection and inflammatory reaction due to other cardiac and vascular devices, implants and grafts, initial encounter: Secondary | ICD-10-CM | POA: Diagnosis present

## 2021-12-22 DIAGNOSIS — A4181 Sepsis due to Enterococcus: Secondary | ICD-10-CM | POA: Diagnosis present

## 2021-12-22 DIAGNOSIS — I5023 Acute on chronic systolic (congestive) heart failure: Secondary | ICD-10-CM | POA: Diagnosis not present

## 2021-12-22 DIAGNOSIS — C641 Malignant neoplasm of right kidney, except renal pelvis: Secondary | ICD-10-CM | POA: Diagnosis present

## 2021-12-22 DIAGNOSIS — Z87442 Personal history of urinary calculi: Secondary | ICD-10-CM | POA: Diagnosis not present

## 2021-12-22 DIAGNOSIS — I5032 Chronic diastolic (congestive) heart failure: Secondary | ICD-10-CM | POA: Diagnosis present

## 2021-12-22 DIAGNOSIS — G9341 Metabolic encephalopathy: Secondary | ICD-10-CM

## 2021-12-22 DIAGNOSIS — A419 Sepsis, unspecified organism: Secondary | ICD-10-CM

## 2021-12-22 DIAGNOSIS — L7632 Postprocedural hematoma of skin and subcutaneous tissue following other procedure: Secondary | ICD-10-CM | POA: Diagnosis not present

## 2021-12-22 DIAGNOSIS — F32A Depression, unspecified: Secondary | ICD-10-CM | POA: Diagnosis present

## 2021-12-22 DIAGNOSIS — N179 Acute kidney failure, unspecified: Secondary | ICD-10-CM | POA: Diagnosis present

## 2021-12-22 DIAGNOSIS — R4182 Altered mental status, unspecified: Secondary | ICD-10-CM | POA: Diagnosis not present

## 2021-12-22 DIAGNOSIS — I480 Paroxysmal atrial fibrillation: Secondary | ICD-10-CM | POA: Diagnosis present

## 2021-12-22 DIAGNOSIS — N5082 Scrotal pain: Secondary | ICD-10-CM

## 2021-12-22 DIAGNOSIS — I824Z2 Acute embolism and thrombosis of unspecified deep veins of left distal lower extremity: Secondary | ICD-10-CM

## 2021-12-22 DIAGNOSIS — Y831 Surgical operation with implant of artificial internal device as the cause of abnormal reaction of the patient, or of later complication, without mention of misadventure at the time of the procedure: Secondary | ICD-10-CM | POA: Diagnosis present

## 2021-12-22 DIAGNOSIS — I2510 Atherosclerotic heart disease of native coronary artery without angina pectoris: Secondary | ICD-10-CM | POA: Diagnosis present

## 2021-12-22 DIAGNOSIS — I82441 Acute embolism and thrombosis of right tibial vein: Secondary | ICD-10-CM | POA: Diagnosis present

## 2021-12-22 DIAGNOSIS — N182 Chronic kidney disease, stage 2 (mild): Secondary | ICD-10-CM | POA: Diagnosis present

## 2021-12-22 DIAGNOSIS — Z20822 Contact with and (suspected) exposure to covid-19: Secondary | ICD-10-CM | POA: Diagnosis present

## 2021-12-22 DIAGNOSIS — E861 Hypovolemia: Secondary | ICD-10-CM | POA: Diagnosis present

## 2021-12-22 DIAGNOSIS — E1165 Type 2 diabetes mellitus with hyperglycemia: Secondary | ICD-10-CM | POA: Diagnosis present

## 2021-12-22 DIAGNOSIS — I129 Hypertensive chronic kidney disease with stage 1 through stage 4 chronic kidney disease, or unspecified chronic kidney disease: Secondary | ICD-10-CM | POA: Diagnosis not present

## 2021-12-22 DIAGNOSIS — E1122 Type 2 diabetes mellitus with diabetic chronic kidney disease: Secondary | ICD-10-CM | POA: Diagnosis present

## 2021-12-22 DIAGNOSIS — R7881 Bacteremia: Secondary | ICD-10-CM | POA: Diagnosis not present

## 2021-12-22 DIAGNOSIS — T827XXD Infection and inflammatory reaction due to other cardiac and vascular devices, implants and grafts, subsequent encounter: Secondary | ICD-10-CM | POA: Diagnosis not present

## 2021-12-22 DIAGNOSIS — Z950 Presence of cardiac pacemaker: Secondary | ICD-10-CM | POA: Diagnosis not present

## 2021-12-22 DIAGNOSIS — E119 Type 2 diabetes mellitus without complications: Secondary | ICD-10-CM

## 2021-12-22 DIAGNOSIS — Z79899 Other long term (current) drug therapy: Secondary | ICD-10-CM | POA: Diagnosis not present

## 2021-12-22 DIAGNOSIS — J449 Chronic obstructive pulmonary disease, unspecified: Secondary | ICD-10-CM | POA: Diagnosis present

## 2021-12-22 DIAGNOSIS — I3139 Other pericardial effusion (noninflammatory): Secondary | ICD-10-CM | POA: Diagnosis not present

## 2021-12-22 DIAGNOSIS — I509 Heart failure, unspecified: Secondary | ICD-10-CM | POA: Diagnosis not present

## 2021-12-22 DIAGNOSIS — B952 Enterococcus as the cause of diseases classified elsewhere: Secondary | ICD-10-CM | POA: Diagnosis not present

## 2021-12-22 HISTORY — DX: Metabolic encephalopathy: G93.41

## 2021-12-22 HISTORY — DX: Sepsis, unspecified organism: R65.20

## 2021-12-22 HISTORY — DX: Scrotal pain: N50.82

## 2021-12-22 LAB — MRSA NEXT GEN BY PCR, NASAL: MRSA by PCR Next Gen: NOT DETECTED

## 2021-12-22 LAB — SEDIMENTATION RATE: Sed Rate: 3 mm/hr (ref 0–16)

## 2021-12-22 LAB — BLOOD CULTURE ID PANEL (REFLEXED) - BCID2

## 2021-12-22 LAB — COMPREHENSIVE METABOLIC PANEL
ALT: 30 U/L (ref 0–44)
AST: 15 U/L (ref 15–41)
Albumin: 3 g/dL — ABNORMAL LOW (ref 3.5–5.0)
Alkaline Phosphatase: 73 U/L (ref 38–126)
Anion gap: 6 (ref 5–15)
BUN: 20 mg/dL (ref 8–23)
CO2: 25 mmol/L (ref 22–32)
Calcium: 8.1 mg/dL — ABNORMAL LOW (ref 8.9–10.3)
Chloride: 103 mmol/L (ref 98–111)
Creatinine, Ser: 1.19 mg/dL (ref 0.61–1.24)
GFR, Estimated: >60 mL/min (ref >60)
Glucose, Bld: 161 mg/dL — ABNORMAL HIGH (ref 70–99)
Potassium: 3.7 mmol/L (ref 3.5–5.1)
Sodium: 134 mmol/L — ABNORMAL LOW (ref 135–145)
Total Bilirubin: 1.6 mg/dL — ABNORMAL HIGH (ref 0.3–1.2)
Total Protein: 5.7 g/dL — ABNORMAL LOW (ref 6.5–8.1)

## 2021-12-22 LAB — RESPIRATORY PANEL BY PCR

## 2021-12-22 LAB — CBC
HCT: 35.1 % — ABNORMAL LOW (ref 39.0–52.0)
HCT: 34.7 % — ABNORMAL LOW (ref 39.0–52.0)
Hemoglobin: 11.1 g/dL — ABNORMAL LOW (ref 13.0–17.0)
Hemoglobin: 11.1 g/dL — ABNORMAL LOW (ref 13.0–17.0)
MCH: 26.6 pg (ref 26.0–34.0)
MCH: 27 pg (ref 26.0–34.0)
MCHC: 31.6 g/dL (ref 30.0–36.0)
MCHC: 32 g/dL (ref 30.0–36.0)
MCV: 84.2 fL (ref 80.0–100.0)
MCV: 84.4 fL (ref 80.0–100.0)
Platelets: 129 10*3/uL — ABNORMAL LOW (ref 150–400)
Platelets: 138 10*3/uL — ABNORMAL LOW (ref 150–400)
RBC: 4.17 MIL/uL — ABNORMAL LOW (ref 4.22–5.81)
RBC: 4.11 MIL/uL — ABNORMAL LOW (ref 4.22–5.81)
RDW: 15.5 % (ref 11.5–15.5)
RDW: 15.4 % (ref 11.5–15.5)
WBC: 15.4 10*3/uL — ABNORMAL HIGH (ref 4.0–10.5)
WBC: 15 10*3/uL — ABNORMAL HIGH (ref 4.0–10.5)
nRBC: 0 % (ref 0.0–0.2)
nRBC: 0 % (ref 0.0–0.2)

## 2021-12-22 LAB — GLUCOSE, CAPILLARY
Glucose-Capillary: 171 mg/dL — ABNORMAL HIGH (ref 70–99)
Glucose-Capillary: 177 mg/dL — ABNORMAL HIGH (ref 70–99)
Glucose-Capillary: 126 mg/dL — ABNORMAL HIGH (ref 70–99)
Glucose-Capillary: 142 mg/dL — ABNORMAL HIGH (ref 70–99)

## 2021-12-22 LAB — SODIUM, URINE, RANDOM: Sodium, Ur: 37 mmol/L

## 2021-12-22 LAB — C-REACTIVE PROTEIN: CRP: 13.9 mg/dL — ABNORMAL HIGH (ref <1.0)

## 2021-12-22 LAB — PHOSPHORUS: Phosphorus: 3 mg/dL (ref 2.5–4.6)

## 2021-12-22 LAB — HEMOGLOBIN A1C
Hgb A1c MFr Bld: 6.5 % — ABNORMAL HIGH (ref 4.8–5.6)
Mean Plasma Glucose: 139.85 mg/dL

## 2021-12-22 LAB — OSMOLALITY: Osmolality: 284 mOsm/kg (ref 275–295)

## 2021-12-22 LAB — AMMONIA: Ammonia: 16 umol/L (ref 9–35)

## 2021-12-22 LAB — OSMOLALITY, URINE: Osmolality, Ur: 451 mOsm/kg (ref 300–900)

## 2021-12-22 LAB — D-DIMER, QUANTITATIVE: D-Dimer, Quant: 1 ug/mL-FEU — ABNORMAL HIGH (ref 0.00–0.50)

## 2021-12-22 LAB — MAGNESIUM: Magnesium: 1.9 mg/dL (ref 1.7–2.4)

## 2021-12-22 MED ORDER — ACETAMINOPHEN 325 MG PO TABS
650.0000 mg | ORAL_TABLET | Freq: Four times a day (QID) | ORAL | Status: DC | PRN
  Administered 2021-12-22 – 2022-01-01 (×3): 650 mg via ORAL
  Filled 2021-12-22 (×3): qty 2

## 2021-12-22 MED ORDER — INSULIN ASPART 100 UNIT/ML IJ SOLN
0.0000 [IU] | Freq: Every day | INTRAMUSCULAR | Status: DC
  Administered 2021-12-24: 2 [IU] via SUBCUTANEOUS

## 2021-12-22 MED ORDER — SODIUM CHLORIDE 0.9 % IV SOLN
2.0000 g | INTRAVENOUS | Status: DC
  Administered 2021-12-22 – 2022-01-01 (×56): 2 g via INTRAVENOUS
  Filled 2021-12-22 (×18): qty 2000
  Filled 2021-12-22 (×3): qty 2
  Filled 2021-12-22 (×7): qty 2000
  Filled 2021-12-22: qty 2
  Filled 2021-12-22 (×7): qty 2000
  Filled 2021-12-22: qty 2
  Filled 2021-12-22 (×4): qty 2000
  Filled 2021-12-22: qty 2
  Filled 2021-12-22 (×8): qty 2000
  Filled 2021-12-22 (×3): qty 2
  Filled 2021-12-22 (×7): qty 2000
  Filled 2021-12-22: qty 2
  Filled 2021-12-22 (×3): qty 2000

## 2021-12-22 MED ORDER — AMLODIPINE BESYLATE 2.5 MG PO TABS
2.5000 mg | ORAL_TABLET | Freq: Every day | ORAL | Status: DC
  Administered 2021-12-22 – 2022-01-01 (×11): 2.5 mg via ORAL
  Filled 2021-12-22 (×11): qty 1

## 2021-12-22 MED ORDER — ENOXAPARIN SODIUM 40 MG/0.4ML IJ SOSY
40.0000 mg | PREFILLED_SYRINGE | INTRAMUSCULAR | Status: DC
  Administered 2021-12-22: 40 mg via SUBCUTANEOUS
  Filled 2021-12-22: qty 0.4

## 2021-12-22 MED ORDER — ENOXAPARIN SODIUM 100 MG/ML IJ SOSY
1.0000 mg/kg | PREFILLED_SYRINGE | Freq: Two times a day (BID) | INTRAMUSCULAR | Status: DC
  Administered 2021-12-22 – 2021-12-30 (×15): 90 mg via SUBCUTANEOUS
  Filled 2021-12-22 (×15): qty 1

## 2021-12-22 MED ORDER — POLYETHYLENE GLYCOL 3350 17 G PO PACK
17.0000 g | PACK | Freq: Every day | ORAL | Status: DC | PRN
Start: 2021-12-22 — End: 2022-01-01

## 2021-12-22 MED ORDER — DOXAZOSIN MESYLATE 2 MG PO TABS
2.0000 mg | ORAL_TABLET | Freq: Every day | ORAL | Status: DC
  Administered 2021-12-22 – 2021-12-31 (×10): 2 mg via ORAL
  Filled 2021-12-22 (×11): qty 1

## 2021-12-22 MED ORDER — INSULIN ASPART 100 UNIT/ML IJ SOLN
0.0000 [IU] | Freq: Three times a day (TID) | INTRAMUSCULAR | Status: DC
  Administered 2021-12-22: 1 [IU] via SUBCUTANEOUS
  Administered 2021-12-22 (×2): 2 [IU] via SUBCUTANEOUS
  Administered 2021-12-23: 1 [IU] via SUBCUTANEOUS
  Administered 2021-12-23: 2 [IU] via SUBCUTANEOUS
  Administered 2021-12-24 (×2): 1 [IU] via SUBCUTANEOUS
  Administered 2021-12-25 (×2): 2 [IU] via SUBCUTANEOUS
  Administered 2021-12-26: 1 [IU] via SUBCUTANEOUS
  Administered 2021-12-27: 2 [IU] via SUBCUTANEOUS
  Administered 2021-12-27 – 2021-12-28 (×4): 1 [IU] via SUBCUTANEOUS
  Administered 2021-12-29: 2 [IU] via SUBCUTANEOUS
  Administered 2021-12-30: 1 [IU] via SUBCUTANEOUS
  Administered 2021-12-30: 2 [IU] via SUBCUTANEOUS
  Administered 2021-12-31: 5 [IU] via SUBCUTANEOUS
  Administered 2021-12-31: 3 [IU] via SUBCUTANEOUS
  Administered 2021-12-31: 1 [IU] via SUBCUTANEOUS
  Administered 2022-01-01: 2 [IU] via SUBCUTANEOUS
  Administered 2022-01-01: 3 [IU] via SUBCUTANEOUS

## 2021-12-22 MED ORDER — LACTATED RINGERS IV SOLN: INTRAVENOUS | Status: AC

## 2021-12-22 MED ORDER — ATORVASTATIN CALCIUM 40 MG PO TABS
40.0000 mg | ORAL_TABLET | Freq: Every day | ORAL | Status: DC
  Administered 2021-12-22 – 2022-01-01 (×10): 40 mg via ORAL
  Filled 2021-12-22 (×11): qty 1

## 2021-12-22 MED ORDER — SODIUM CHLORIDE 0.9 % IV SOLN
2.0000 g | INTRAVENOUS | Status: DC
  Filled 2021-12-22 (×4): qty 2000

## 2021-12-22 MED ORDER — MELATONIN 3 MG PO TABS
3.0000 mg | ORAL_TABLET | Freq: Every evening | ORAL | Status: DC | PRN
  Administered 2021-12-30: 3 mg via ORAL
  Filled 2021-12-22 (×2): qty 1

## 2021-12-22 MED ORDER — ASPIRIN EC 81 MG PO TBEC
81.0000 mg | DELAYED_RELEASE_TABLET | Freq: Every day | ORAL | Status: DC
  Administered 2021-12-22 – 2021-12-29 (×8): 81 mg via ORAL
  Filled 2021-12-22 (×9): qty 1

## 2021-12-22 MED ORDER — ONDANSETRON HCL 4 MG/2ML IJ SOLN: 4.0000 mg | Freq: Four times a day (QID) | INTRAMUSCULAR | Status: DC | PRN

## 2021-12-22 MED ORDER — OXYCODONE HCL 5 MG PO TABS
5.0000 mg | ORAL_TABLET | Freq: Four times a day (QID) | ORAL | Status: DC | PRN
  Administered 2021-12-31 – 2022-01-01 (×4): 5 mg via ORAL
  Filled 2021-12-22 (×5): qty 1

## 2021-12-22 NOTE — Progress Notes (Addendum)
Pt arrived to room via [facility] with belongings (hearing aids and glasses present with pt). Pt oriented to room/floor. Placed on telemetry. Pt watched infection/orientation video. Denies any questions or further needs at this time. Wife aware of transfer to unit.  ?

## 2021-12-22 NOTE — Progress Notes (Addendum)
?Progress Note ? ? ?PatientShayden Jarvis KVQ:[id number] DOB: 10-26-1950 DOA: 12/21/2021     0 ?DOS: the patient was seen and examined on 12/22/2021 ?  ?Brief hospital course: ? 71 y.o. male with medical history significant for COPD, coronary artery disease, remote history of seizure off Keppra for at least 8 years, nephrolithiasis requiring lithotripsy, CKD 2, chronic anxiety/depression paroxysmal A-fib not on oral anticoagulation, chronic diastolic CHF, status post cardiac pacemaker placement, right renal cell carcinoma status post renal cryoablation 12/06/2015, left knee total replacement in 2019, who presented to [facility] from home due to altered mental status, scrotal pain, flank pain and fever.  Patient works night shift and came home morning of 12/21/2021.  His wife noticed that he felt warm.  Associated with mild confusion, scrotal, and bilateral flank pain.  Per his wife via phone, he has complained of scrotal pain for the past few days.  He made his PCP aware, and an exam was done in the office, it was benign.  UA negative.  CT head nonacute.  CT abdomen and pelvis showing no acute intra-abdominal or intrapelvic process.  Showing post therapeutic changes from cryoablation of a right renal cell carcinoma.  No evidence of local recurrence.  Chest x-ray no active disease.  No clear source of infection, but due to concern for an active infective process with fever of Tmax 102.6, respiratory rate 38 and WBC 15.1K, patient was started on IV antibiotics empirically in the ED, Merrem.  EDP requested admission for further evaluation and management.  Patient was accepted by the hospitalist service, Dr. Alcario Drought, [facility]  Admitted as a direct admission to [facility] long hospital progressive unit. ? ?4/16- no obvious source of infection elucidated. US scrotum remarkable for microcalcifications on R testicle without abscess, fluid or sign of infection. ?Await blood cultures, send ESR/CRP. Consider DVT as source of  fever, fatigue/AMS. Will obtain US BLE ? ? ?Assessment and Plan: ? ?Notable for dvt L lower, unsure duration based on read-- will initiate lovenox and transition to [place] when encephalopathy clinically improves ? ?E- faecalis blood culture- source unknown, most likely GI is driving this, but await urine culture, ID will be consulted. Empiric antibiotics switched to ampicillin with discussion to our pharmacy team. Antibiotic susceptibility to follow-- continue to monitor clinically and if worsens can broaden overall.  ? ?* Sepsis ([place]) ?Tmax 102.6, leukocytosis 15,000, tachypnea respiratory rate 38, no clear source of infection. ?UA, chest x-ray, CT scan abdomen and pelvis, unrevealing. ?US scrotum without obvious source of fever.  ?Blood cultures NGTD ?Urine Cultures NGTD ?Has PPM in placebut does not appear to be involved in infection ?- Scrotal US without source of infection ?- h/o cancer consider DVT as cause of fever but generally does not cause AMS ?Continue empiric IV antibiotics until source of infection has resulted ?Monitor fever curve and WBC ?Not unreasonable to obtain TTE, although if bl cx +, TEE would have larger yield ? ? ?Acute metabolic encephalopathy ?CT head negative ??Sepsis vs AKI. No obvious source of encephalopathy. ?Improving with antibiotics, supportive care, IVF.  ?Monitor. ?Delirium precautions ? ?Hyponatremia ?Most likely hypovolemic given presentation. ?AKI improving with IVF resuscitation, and sodium normalizing. ?Will send off Urine osm, na if worsening. Do not believe is causing AMS ? ?Type 2 diabetes mellitus ([place]) ?A1C 6.5%, SSI for now ? ?Coronary artery disease ?No anginal symptoms ?Resume home aspirin and Lipitor ?Monitor on telemetry ? ?Scrotal pain ?No evidence of cystitis, epididymitis on exam/labwork. ?US Scrotum with microcalcification  on R scrotum, unsure significance in setting of RCC ? ?Acute on chronic systolic CHF (congestive heart failure) ([place]) ?No recent Echo on file,  appears euvolemic ?F/u TTE today ?Resume HTN medications as clinically appropraite if no hypotension/shock ? ?Atrial fibrillation, chronic ([place]) ?Euvolemic; Resume home regimen; Hold off home Lasix;  ?Closely monitor volume status while on judicious IV fluid hydration. ?Start strict I's and O's and daily weight ? ? ? ?  ? ?Subjective:Sitting in bed, no acute concerns. A&O x 3 ?Breathing comfortably. Not in distress. ?No obvious wounds or pressure ulcers notified. ?  ? ?Physical Exam: ?Vitals:  ? 12/22/21 0149 12/22/21 0629 12/22/21 0810 12/22/21 1118  ?BP: (!) 158/69 138/64 137/67 124/62  ?Pulse: 60 (!) 58 (!) 58 (!) 59  ?Resp: (!) 24 (!) 28 (!) 26 (!) 24  ?Temp: 99.7 ?F (37.6 ?C) 100.2 ?F (37.9 ?C) 98.3 ?F (36.8 ?C) 98.4 ?F (36.9 ?C)  ?TempSrc: Oral Oral Oral Oral  ?SpO2: 95% 92% 93% 93%  ?Weight: 91 kg     ?Height: _0  (1.753 m)     ? ?71 y.o. year-old male well developed well nourished in no acute distress.  Alert and oriented x2. ?Cardiovascular: Regular rate and rhythm with no rubs or gallops.  No thyromegaly or JVD noted.  No lower extremity edema. 2/4 pulses in all 4 extremities. ?Respiratory: Clear to auscultation with no wheezes or rales. Good inspiratory effort. ?Abdomen: Soft nontender nondistended with normal bowel sounds x4 quadrants. ?Muskuloskeletal: No cyanosis, clubbing or edema noted bilaterally ?Neuro: CN II-XII intact, strength, sensation, reflexes ?Skin: No ulcerative lesions noted or rashes.  Left third toe with healed blister. ?Psychiatry: Mood is appropriate for condition and setting ? ' ? ?Data Reviewed: ?Results for orders placed or performed during the hospital encounter of 12/21/21 (from the past 24 hour(s))  ?CBC     Status: Abnormal  ? Collection Time: 12/21/21  8:33 PM  ?Result Value Ref Range  ? WBC 15.1 (H) 4.0 - 10.5 K/uL  ? RBC 4.58 4.22 - 5.81 MIL/uL  ? Hemoglobin 12.3 (L) 13.0 - 17.0 g/dL  ? HCT 37.9 (L) 39.0 - 52.0 %  ? MCV 82.8 80.0 - 100.0 fL  ? MCH 26.9 26.0 - 34.0 pg  ?  MCHC 32.5 30.0 - 36.0 g/dL  ? RDW 15.3 11.5 - 15.5 %  ? Platelets 157 150 - 400 K/uL  ? nRBC 0.0 0.0 - 0.2 %  ?Lactic acid, plasma     Status: None  ? Collection Time: 12/21/21  8:33 PM  ?Result Value Ref Range  ? Lactic Acid, Venous 1.2 0.5 - 1.9 mmol/L  ?Blood culture (routine x 2)     Status: None (Preliminary result)  ? Collection Time: 12/21/21  8:33 PM  ? Specimen: BLOOD  ?Result Value Ref Range  ? Specimen Description    ?  BLOOD RIGHT ANTECUBITAL ?Performed at [facility], [address], [place], [place] [address] ?  ? Special Requests    ?  BOTTLES DRAWN AEROBIC AND ANAEROBIC Blood Culture adequate volume ?Performed at [facility], [address] [place], [place] [address] ?  ? Culture  Setup Time    ?  GRAM POSITIVE COCCI ?IN BOTH AEROBIC AND ANAEROBIC BOTTLES ?Organism ID to follow ?Performed at [facility], [place] [address] [place], [place] [address] ?  ? Culture GRAM POSITIVE COCCI   ? Report Status PENDING   ?Comprehensive metabolic panel  Status: Abnormal  ? Collection Time: 12/21/21  8:33 PM  ?Result Value Ref Range  ? Sodium 132 (L) 135 - 145 mmol/L  ? Potassium 3.8 3.5 - 5.1 mmol/L  ? Chloride 99 98 - 111 mmol/L  ? CO2 25 22 - 32 mmol/L  ? Glucose, Bld 153 (H) 70 - 99 mg/dL  ? BUN 20 8 - 23 mg/dL  ? Creatinine, Ser 1.42 (H) 0.61 - 1.24 mg/dL  ? Calcium 8.3 (L) 8.9 - 10.3 mg/dL  ? Total Protein 6.5 6.5 - 8.1 g/dL  ? Albumin 3.5 3.5 - 5.0 g/dL  ? AST 19 15 - 41 U/L  ? ALT 20 0 - 44 U/L  ? Alkaline Phosphatase 86 38 - 126 U/L  ? Total Bilirubin 1.2 0.3 - 1.2 mg/dL  ? GFR, Estimated 52 (L) >60 mL/min  ? Anion gap 8 5 - 15  ?Resp Panel by RT-PCR (Flu A&B, Covid) Nasopharyngeal Swab     Status: None  ? Collection Time: 12/21/21  8:33 PM  ? Specimen: Nasopharyngeal Swab; Nasopharyngeal(NP) swabs in vial transport medium  ?Result Value Ref Range  ? SARS Coronavirus 2 by RT PCR NEGATIVE NEGATIVE  ? Influenza A by PCR NEGATIVE NEGATIVE  ? Influenza B by PCR NEGATIVE  NEGATIVE  ?Blood culture (routine x 2)     Status: None (Preliminary result)  ? Collection Time: 12/21/21  8:51 PM  ? Specimen: BLOOD  ?Result Value Ref Range  ? Specimen Description    ?  BLOOD BLOOD LEFT WRIS

## 2021-12-22 NOTE — Assessment & Plan Note (Signed)
No anginal symptoms ?Resume home aspirin and Lipitor ?Monitor on telemetry ?

## 2021-12-22 NOTE — Assessment & Plan Note (Signed)
No evidence of cystitis, epididymitis on exam/labwork. ?US Scrotum with microcalcification on R scrotum, unsure significance in setting of RCC ?

## 2021-12-22 NOTE — Assessment & Plan Note (Signed)
A1C 6.5%, SSI for now ?

## 2021-12-22 NOTE — Assessment & Plan Note (Signed)
No recent Echo on file, appears euvolemic ?F/u TTE today ?Resume HTN medications as clinically appropraite if no hypotension/shock ?

## 2021-12-22 NOTE — Hospital Course (Addendum)
Jason Jarvis is a 71 y.o. M with hx CAD, pAF not on AC and bradycardia s/p PPM c/b MRSA bacteremia in 2019 with PPM removal then later reimplantation, hx COPD, CKD 2, seizures no longer on AED, dCHF, R renal cell carcinoma s/p cryoablation 2017, left knee replacement who presented with AMS, scrotal pain, flank pain and fever.   ? ?Patient works night shift and came home morning of admission, looked sick, wife noticed somewhat confused, warm, complaining of scrotal pain, flank pain which he told her at that time had been present a few days.  ? ?Sent to the ER where UA negative, CT head normal, CT abdomen/pelvis unremarkable, CXR clear but temp 102.55F, RR 38 and WBC 15.1K, and confused ? ? ?4/15: started on Merem and admitted for sepsis ?4/16: US scrotum remarkable for microcalcifications on R testicle without abscess, fluid or sign of infection --> blood cultures positive for enterococcus; CT left foot normal; [place] LE shows age indeterminate DVT in left posterior tib; ID consulted, switched to ampicillin ?4/17: TTE with EF 50%, grade I DD ?4/20: TEE shows vegetation on pacer lead ?4/23: Pacer removed ?4/25: PICC line placed ?

## 2021-12-22 NOTE — ED Notes (Signed)
Patient is asleep.  

## 2021-12-22 NOTE — Progress Notes (Signed)
PHARMACY - PHYSICIAN COMMUNICATION ?CRITICAL VALUE ALERT - BLOOD CULTURE IDENTIFICATION (BCID) ? ?Jason Jarvis is an 71 y.o. male who presented to [facility] on 12/21/2021 with a chief complaint of urinary frequency ? ?Assessment:   ?Blood cultures positive 4/4 bottles E.faecalis, no resistance detected ? ?All other cultures pending ? ?Name of physician (or Provider) Contacted: Dr. Alanda Amass ? ?Current antibiotics: meropenem ? ?Changes to prescribed antibiotics recommended: meropenem to ampicillin, automatic ID consult ? ?Results for orders placed or performed during the hospital encounter of 12/21/21  ?Blood Culture ID Panel (Reflexed) (Collected: 12/21/2021  8:33 PM)  ?Result Value Ref Range  ? Enterococcus faecalis DETECTED (A) NOT DETECTED  ? Enterococcus Faecium NOT DETECTED NOT DETECTED  ? Listeria monocytogenes NOT DETECTED NOT DETECTED  ? Staphylococcus species NOT DETECTED NOT DETECTED  ? Staphylococcus aureus (BCID) NOT DETECTED NOT DETECTED  ? Staphylococcus epidermidis NOT DETECTED NOT DETECTED  ? Staphylococcus lugdunensis NOT DETECTED NOT DETECTED  ? Streptococcus species NOT DETECTED NOT DETECTED  ? Streptococcus agalactiae NOT DETECTED NOT DETECTED  ? Streptococcus pneumoniae NOT DETECTED NOT DETECTED  ? Streptococcus pyogenes NOT DETECTED NOT DETECTED  ? A.calcoaceticus-baumannii NOT DETECTED NOT DETECTED  ? Bacteroides fragilis NOT DETECTED NOT DETECTED  ? Enterobacterales NOT DETECTED NOT DETECTED  ? Enterobacter cloacae complex NOT DETECTED NOT DETECTED  ? Escherichia coli NOT DETECTED NOT DETECTED  ? Klebsiella aerogenes NOT DETECTED NOT DETECTED  ? Klebsiella oxytoca NOT DETECTED NOT DETECTED  ? Klebsiella pneumoniae NOT DETECTED NOT DETECTED  ? Proteus species NOT DETECTED NOT DETECTED  ? Salmonella species NOT DETECTED NOT DETECTED  ? Serratia marcescens NOT DETECTED NOT DETECTED  ? Haemophilus influenzae NOT DETECTED NOT DETECTED  ? Neisseria meningitidis NOT DETECTED NOT DETECTED  ? Pseudomonas  aeruginosa NOT DETECTED NOT DETECTED  ? Stenotrophomonas maltophilia NOT DETECTED NOT DETECTED  ? Candida albicans NOT DETECTED NOT DETECTED  ? Candida auris NOT DETECTED NOT DETECTED  ? Candida glabrata NOT DETECTED NOT DETECTED  ? Candida krusei NOT DETECTED NOT DETECTED  ? Candida parapsilosis NOT DETECTED NOT DETECTED  ? Candida tropicalis NOT DETECTED NOT DETECTED  ? Cryptococcus neoformans/gattii NOT DETECTED NOT DETECTED  ? Vancomycin resistance NOT DETECTED NOT DETECTED  ? ? ?Tawnya Crook, PharmD, BCPS ?Clinical Pharmacist ?12/22/2021 1:57 PM ? ? ?

## 2021-12-22 NOTE — H&P (Signed)
?History and Physical ? ?Jason Jarvis PIR:[id number] DOB: [birth year] DOA: 12/21/2021 ? ?Referring physician: Direct admit from [place], admitted by Dr. Alcario Drought, [facility]  ?PCP: Jeni Salles, MD  ?Outpatient Specialists: Pulmonology, cardiology. ?Patient coming from: Direct admission from [place]. ? ?Chief Complaint: Altered mental status, subjective fever and increasing urinary frequency. ? ?HPI: Jason Jarvis is a 71 y.o. male with medical history significant for COPD, coronary artery disease, remote history of seizure off Keppra for at least 8 years, nephrolithiasis requiring lithotripsy, CKD 2, chronic anxiety/depression paroxysmal A-fib not on oral anticoagulation, chronic diastolic CHF, status post cardiac pacemaker placement, right renal cell carcinoma status post renal cryoablation 12/06/2015, left knee total replacement in 2019, who presented to [facility] from home due to altered mental status, scrotal pain, flank pain and fever.  Patient works night shift and came home morning of 12/21/2021.  His wife noticed that he felt warm.  Associated with mild confusion, scrotal, and bilateral flank pain.  Per his wife via phone, he has complained of scrotal pain for the past few days.  He made his PCP aware, and an exam was done in the office, it was benign.  UA negative.  CT head nonacute.  CT abdomen and pelvis showing no acute intra-abdominal or intrapelvic process.  Showing post therapeutic changes from cryoablation of a right renal cell carcinoma.  No evidence of local recurrence.  Chest x-ray no active disease.  No clear source of infection, but due to concern for an active infective process with fever of Tmax 102.6, respiratory rate 38 and WBC 15.1K, patient was started on IV antibiotics empirically in the ED, Merrem.  EDP requested admission for further evaluation and management.  Patient was accepted by the hospitalist service, Dr. Alcario Drought, [facility].  Admitted as a direct  admission to [facility] long hospital progressive unit. ? ? ?ED Course: Tmax 102.6.  BP 162/73, pulse 59, respiration rate 38.  Lab studies remarkable for WBC 15.1, hemoglobin 12.3, platelet 137.  Serum glucose 153, sodium 132, creatinine 1.42, GFR of 52.  Baseline creatinine 1.22 with GFR 60. ? ?Review of Systems: ?Review of systems as noted in the HPI. All other systems reviewed and are negative. ? ? ?Past Medical History:  ?Diagnosis Date  ? Arm DVT (deep venous thromboembolism), acute ([place])   ? Chronic kidney disease   ? Coronary artery disease   ? Hypertension   ? Myocardial infarction [facility])   ? pt states that he had a "light heart attack about 25 yrs ago"  ? Pacemaker   ? Pneumonia   ? Stroke [facility])   ? approx 15 yrs ago  ? ?Past Surgical History:  ?Procedure Laterality Date  ? IR GENERIC HISTORICAL  04/22/2016  ? IR RADIOLOGIST EVAL & MGMT 04/22/2016 Greggory Keen, MD [facility] INTERV RAD  ? IR RADIOLOGIST EVAL & MGMT  05/18/2019  ? IR RADIOLOGIST EVAL & MGMT  07/06/2019  ? IR RADIOLOGIST EVAL & MGMT  10/26/2019  ? IR RADIOLOGIST EVAL & MGMT  10/30/2020  ? IR RADIOLOGIST EVAL & MGMT  11/01/2021  ? PACEMAKER IMPLANT  05/2017  ? PACEMAKER REMOVAL  07/2017  ? ? ?Social History:  reports that he has never smoked. He has never used smokeless tobacco. He reports that he does not drink alcohol and does not use drugs. ? ? ?Allergies  ?Allergen Reactions  ? Cephalexin Hives  ? Lorazepam Other (See Comments)  ?  Hallucinated / Confused / Combative  ? Penicillins Hives  ?  Ace Inhibitors Cough  ?   ?  ? Prednisone Rash  ? Sulfa Antibiotics Rash  ? ? ?Family history: ?Mother with history of heart disease ? ? ?Prior to Admission medications   ?Medication Sig Start Date End Date Taking? Authorizing Provider  ?amLODipine (NORVASC) 2.5 MG tablet  01/23/16   [provider]  ?aspirin EC 81 MG tablet Take by mouth daily.    [provider]  ?atorvastatin (LIPITOR) 40 MG tablet Take 40 mg by mouth daily.    [provider]  ?carisoprodol (SOMA) 350 MG tablet Take 350 mg by mouth 4 (four) times daily as needed for muscle spasms. Reported on 01/02/2016    [provider]  ?ciclopirox (LOPROX) 0.77 % cream  11/20/15   [provider]  ?doxazosin (CARDURA) 2 MG tablet Take 2 mg by mouth at bedtime.    [provider]  ?furosemide (LASIX) 80 MG tablet Take 80 mg by mouth. Take 1 tablet every AM. ?Take 1/2 tablet every PM    [provider]  ?gabapentin (NEURONTIN) 600 MG tablet Take 600 mg by mouth 3 (three) times daily.    [provider]  ?HYDROcodone-acetaminophen (NORCO/VICODIN) 5-325 MG tablet Take 1-2 tablets by mouth every 4 (four) hours as needed. 09/09/17   Virgel Manifold, MD  ?hydrOXYzine (ATARAX/VISTARIL) 10 MG tablet Take 10 mg by mouth 3 (three) times daily as needed.    [provider]  ?insulin aspart (NOVOLOG) 100 UNIT/ML injection Inject 15 Units into the skin every evening.    [provider]  ?Insulin Aspart Prot & Aspart (NOVOLOG MIX 70/30 [place]) Inject 30 Units into the skin every morning.    [provider]  ?IRON PO Take 65 mg by mouth daily.    [provider]  ?losartan (COZAAR) 100 MG tablet Take 100 mg by mouth daily.    [provider]  ?Multiple Vitamins-Minerals (CENTRUM SILVER ADULT 50+ PO) Take 1 tablet by mouth daily.    [provider]  ?nitroGLYCERIN (NITROSTAT) 0.4 MG SL tablet Place 0.4 mg under the tongue every 5 (five) minutes as needed for chest pain.    [provider]  ?potassium chloride (K-DUR) 10 MEQ tablet Take 10 mEq by mouth daily.    [provider]  ?Rivaroxaban 15 & 20 MG TBPK Take as directed on package: Start with one '15mg'$  tablet by mouth twice a day with food. On Day 22, switch to one '20mg'$  tablet once a day with food. 09/09/17   Virgel Manifold, MD  ?valACYclovir (VALTREX) 1000 MG tablet Take 1 tablet (1,000 mg total) by mouth 3 (three) times daily. 10/01/18   Deno Etienne, DO  ? ? ?Physical Exam: ?BP (!) 162/73   Pulse (!) 59   Temp 99.1 ?F (37.3 ?C) (Oral)   Resp (!) 27   Ht '5\' 9"'$  (1.753 m)   Wt 91 kg   SpO2 97%   BMI 29.63 kg/m?  ? ?General: 71 y.o. year-old male well developed well nourished in no acute distress.  Alert and oriented x2. ?Cardiovascular: Regular rate and rhythm with no rubs or gallops.  No thyromegaly or JVD noted.  No lower extremity edema. 2/4 pulses in all 4 extremities. ?Respiratory: Clear to auscultation with no wheezes or rales. Good inspiratory effort. ?Abdomen: Soft nontender nondistended with normal bowel sounds x4 quadrants. ?Muskuloskeletal: No cyanosis, clubbing or edema noted bilaterally ?Neuro: CN II-XII intact, strength, sensation, reflexes ?Skin: No ulcerative lesions noted or rashes.  Left third toe with healed blister. ?Psychiatry: Mood is appropriate for condition and setting ?   ?   ?   ?Labs on Admission:  ?Basic Metabolic Panel: ?Recent Labs  ?Lab 12/21/21 ?2033  ?NA 132*  ?K 3.8  ?CL 99  ?CO2 25  ?GLUCOSE 153*  ?BUN 20  ?CREATININE 1.42*  ?CALCIUM 8.3*  ? ?Liver Function Tests: ?Recent Labs  ?Lab 12/21/21 ?2033  ?AST 19  ?ALT 20  ?ALKPHOS 86  ?BILITOT 1.2  ?PROT 6.5  ?ALBUMIN 3.5  ? ?No results for input(s): LIPASE, AMYLASE in the last 168 hours. ?No results for input(s): AMMONIA in the last 168 hours. ?CBC: ?Recent Labs  ?Lab 12/21/21 ?2033  ?WBC 15.1*  ?HGB 12.3*  ?HCT 37.9*  ?MCV 82.8  ?PLT 157  ? ?Cardiac Enzymes: ?No results for input(s): CKTOTAL, CKMB, CKMBINDEX, TROPONINI in the last 168 hours. ? ?BNP (last 3 results) ?No results for input(s): BNP in the last 8760 hours. ? ?ProBNP (last 3 results) ?No results for input(s): PROBNP in the last 8760 hours. ? ?CBG: ?No results for input(s): GLUCAP in the last 168 hours. ? ?Radiological Exams on Admission: ?CT Head Wo Contrast ? ?Result Date: 12/21/2021 ?CLINICAL DATA:  Altered mental status with fever and weakness. EXAM: CT HEAD WITHOUT CONTRAST TECHNIQUE: Contiguous axial images  were obtained from the base of the skull through the vertex without intravenous contrast. RADIATION DOSE REDUCTION: This exam was performed according to the departmental dose-optimization program which includes automated ex

## 2021-12-22 NOTE — Assessment & Plan Note (Signed)
CT head negative ??Sepsis vs AKI. No obvious source of encephalopathy. ?Improving with antibiotics, supportive care, IVF.  ?Monitor. ?Delirium precautions ?

## 2021-12-22 NOTE — ED Notes (Signed)
[facility] is here for transport.  

## 2021-12-22 NOTE — Progress Notes (Signed)
ANTICOAGULATION CONSULT NOTE - Initial Consult ? ?Pharmacy Consult for Lovenox ?Indication: DVT ? ?Allergies  ?Allergen Reactions  ? Cephalexin Hives  ? Lorazepam Other (See Comments)  ?  Hallucinated / Confused / Combative  ? Penicillins Hives  ? Ace Inhibitors Cough  ?   ?  ? Prednisone Rash  ? Sulfa Antibiotics Rash  ? ? ?Patient Measurements: ?Height: '5\' 9"'$  (175.3 cm) ?Weight: 91 kg (200 lb 9.9 oz) ?IBW/kg (Calculated) : 70.7 ? ?Vital Signs: ?Temp: 97.9 ?F (36.6 ?C) (04/16 1416) ?Temp Source: Oral (04/16 1416) ?BP: 134/77 (04/16 1416) ?Pulse Rate: 61 (04/16 1416) ? ?Labs: ?Recent Labs  ?  12/21/21 ?2033 12/22/21 ?0346 12/22/21 ?0806  ?HGB 12.3* 11.1* 11.1*  ?HCT 37.9* 35.1* 34.7*  ?PLT 157 129* 138*  ?CREATININE 1.42* 1.19  --   ? ? ?Estimated Creatinine Clearance: 61.6 mL/min (by C-G formula based on SCr of 1.19 mg/dL). ? ? ?Medical History: ?Past Medical History:  ?Diagnosis Date  ? Arm DVT (deep venous thromboembolism), acute ([place])   ? Chronic kidney disease   ? Coronary artery disease   ? Hypertension   ? Myocardial infarction [facility])   ? pt states that he had a "light heart attack about 25 yrs ago"  ? Pacemaker   ? Pneumonia   ? Stroke [facility])   ? approx 15 yrs ago  ? ? ?Medications:  ?Scheduled:  ? amLODipine  2.5 mg Oral Daily  ? aspirin EC  81 mg Oral Daily  ? atorvastatin  40 mg Oral Daily  ? doxazosin  2 mg Oral QHS  ? insulin aspart  0-5 Units Subcutaneous QHS  ? insulin aspart  0-9 Units Subcutaneous TID WC  ? ?Infusions:  ? sodium chloride Stopped (12/21/21 2153)  ? ampicillin (OMNIPEN) IV    ? lactated ringers 30 mL/hr at 12/22/21 0316  ? ?PRN: sodium chloride, acetaminophen, melatonin, ondansetron (ZOFRAN) IV, oxyCODONE, polyethylene glycol ? ?Assessment: ?71 yo male with hx RCC admitted with sepsis. Doppler shows age indeterminate deep vein thrombosis involving the left posterior tibial veins. Pharmacy consulted to dose Lovenox.  Patient did received '40mg'$  today at 08:09 ? ?CBC: Hgb 11.1 (12.3 on  admit); Plts slightly low 138 (185 in March 2020) ?SCr: 1.42 >> 1.19, CrCl ~61 ml/min ?No anticoagulants PTA, only aspirin '81mg'$  ?No bleeding currently reported ? ?Goal of Therapy:  ?Anti-Xa level 0.6-1 units/ml 4hrs after LMWH dose given ?Monitor platelets by anticoagulation protocol: Yes ?  ?Plan:  ?Lovenox '1mg'$ /kg SQ q12h  ?Check CBC at least q72h ?Monitor for signs/symptoms of bleeding ? ?Peggyann Juba, PharmD, BCPS ?Pharmacy: [phone] ?12/22/2021,3:31 PM ? ? ?

## 2021-12-22 NOTE — Assessment & Plan Note (Signed)
Euvolemic; Resume home regimen; Hold off home Lasix;  ?Closely monitor volume status while on judicious IV fluid hydration. ?Start strict I's and O's and daily weight ?

## 2021-12-22 NOTE — Assessment & Plan Note (Addendum)
Tmax 102.6, leukocytosis 15,000, tachypnea respiratory rate 38, no clear source of infection. ?UA, chest x-ray, CT scan abdomen and pelvis, unrevealing. ?US scrotum without obvious source of fever.  ?Blood cultures NGTD ?Urine Cultures NGTD ?Has PPM in placebut does not appear to be involved in infection ?- Scrotal US without source of infection ?- h/o cancer consider DVT as cause of fever but generally does not cause AMS ?Continue empiric IV antibiotics until source of infection has resulted ?Monitor fever curve and WBC ?Not unreasonable to obtain TTE, although if bl cx +, TEE would have larger yield ? ?

## 2021-12-22 NOTE — Assessment & Plan Note (Signed)
Most likely hypovolemic given presentation. ?AKI improving with IVF resuscitation, and sodium normalizing. ?Will send off Urine osm, na if worsening. Do not believe is causing AMS ?

## 2021-12-22 NOTE — Progress Notes (Signed)
Bilateral lower extremity venous duplex has been completed. ?Preliminary results can be found in CV Proc through chart review.  ?Results were given to the patient's nurse, Mia Creek. ? ?12/22/21 1:42 PM ?Carlos Levering RVT   ?

## 2021-12-22 NOTE — Consult Note (Signed)
?   ? ? ? ? ?[facility]   ? ?Date of Admission:  12/21/2021   Total days of inpatient antibiotics 1 ? ?      ?Reason for Consult: R consult E faecalis bacteremia    ?Principal Problem: ?  Sepsis ([place]) ?Active Problems: ?  Atrial fibrillation, chronic ([place]) ?  Acute on chronic systolic CHF (congestive heart failure) ([place]) ?  Scrotal pain ?  Coronary artery disease ?  Type 2 diabetes mellitus ([place]) ?  Hyponatremia ?  Acute metabolic encephalopathy ?  Altered mental state ? ? ?Assessment: ?71 year old male admitted with altered mental status and scrotal pain found to have E faecalis bacteremia on meropenem. ? ?#E faecalis bacteremia secondary to unclear source ?#Right TKA in 2019 ?#Hx of MRSA bacteremia and PPM infection SP explantation a nd re-implantation ?Work-up: ?-UA negative nitrites and leukocytes.  CT abdomen pelvis unrevealing.  CT head negative. ?- Scrotal ultrasound was performed which showed bilateral large hydroceles ?- Prelim findings of vascular ultrasound showed age indeterminant DVT of left posterior tibial vein. ?Etiology of bacteremia unclear at this point. Given his Hx of PPM infection, TTE and likely TEE is warranted. HE stated his only recent procedure is left foot"corrective" surgery. Left foot Xray showed some 3rd toe soft tissue swelling. Will get CT.  ?Recommendations:  ?-D/C meropenem ?-Start Ampicllin ?-Follow blood and urine Cx ?-Follow-up TTE.  Will need TEE ?-CT left foot ?-Repeat blood Cx to ensure clearance.  ?-Follow up urine Cx ? ?Microbiology:   ?Antibiotics: ?Meropenem 4/15-present ? ?Cultures: ?Blood ?4/15 2/2 GPC's, E faecalis on BC ID ?Urine cultures pending ? ?HPI: Gearld Jarvis is a 71 y.o. male PMHX of MRSA bacteremia with PPM infection SP removal of PPM and 6 weeks of IV vancomycin->daptomycin(concern vancomycin lead to fevers) in 2018(seen by [facility] ID) then Ppm reimplanted on 12/02/2017, CAD, remote history of seizures off Keppra for 8 years,  nephrolithiasis requiring lithotripsy, CKD 2, chronic anxiety/depression, PAF not on anticoagulation, CHF, renal cell carcinoma status post cryoablation on 12/06/2015, left knee replacement in 2019, admitted with altered mental status.  Patient presented from home due to altered mental status, scrotal pain, flank pain and fever.  On arrival temp was 102.6 WBC 15 K. CT abdomen pelvis with contrast showed no acute intra-abdominal/pelvic process, bladder was grossly normal.  Scrotal ultrasound showed bilateral hydroceles.  ID engaged as blood cultures grew E faecalis. ? ?Review of Systems: ?Review of Systems  ?All other systems reviewed and are negative. ? ?Past Medical History:  ?Diagnosis Date  ? Arm DVT (deep venous thromboembolism), acute ([place])   ? Chronic kidney disease   ? Coronary artery disease   ? Hypertension   ? Myocardial infarction [facility])   ? pt states that he had a "light heart attack about 25 yrs ago"  ? Pacemaker   ? Pneumonia   ? Stroke [facility])   ? approx 15 yrs ago  ? ? ?Social History  ? ?Tobacco Use  ? Smoking status: Never  ? Smokeless tobacco: Never  ?Substance Use Topics  ? Alcohol use: No  ?  Alcohol/week: 0.0 standard drinks  ? Drug use: No  ? ? ?History reviewed. No pertinent family history. ?Scheduled Meds: ? amLODipine  2.5 mg Oral Daily  ? aspirin EC  81 mg Oral Daily  ? atorvastatin  40 mg Oral Daily  ? doxazosin  2 mg Oral QHS  ? enoxaparin (LOVENOX) injection  40 mg Subcutaneous Q24H  ? insulin aspart  0-5 Units Subcutaneous QHS  ? insulin aspart  0-9 Units Subcutaneous TID WC  ? ?Continuous Infusions: ? sodium chloride Stopped (12/21/21 2153)  ? ampicillin (OMNIPEN) IV    ? lactated ringers 30 mL/hr at 12/22/21 0316  ? ?PRN Meds:.sodium chloride, acetaminophen, melatonin, ondansetron (ZOFRAN) IV, oxyCODONE, polyethylene glycol ?Allergies  ?Allergen Reactions  ? Cephalexin Hives  ? Lorazepam Other (See Comments)  ?  Hallucinated / Confused / Combative  ? Penicillins Hives  ? Ace Inhibitors  Cough  ?   ?  ? Prednisone Rash  ? Sulfa Antibiotics Rash  ? ? ?OBJECTIVE: ?Blood pressure 134/77, pulse 61, temperature 97.9 ?F (36.6 ?C), temperature source Oral, resp. rate (!) 24, height '5\' 9"'$  (1.753 m), weight 91 kg, SpO2 97 %. ? ?Physical Exam ?Constitutional:   ?   General: He is not in acute distress. ?   Appearance: He is normal weight. He is not toxic-appearing.  ?HENT:  ?   Head: Normocephalic and atraumatic.  ?   Right Ear: External ear normal.  ?   Left Ear: External ear normal.  ?   Nose: No congestion or rhinorrhea.  ?   Mouth/Throat:  ?   Mouth: Mucous membranes are moist.  ?   Pharynx: Oropharynx is clear.  ?Eyes:  ?   Extraocular Movements: Extraocular movements intact.  ?   Conjunctiva/sclera: Conjunctivae normal.  ?   Pupils: Pupils are equal, round, and reactive to light.  ?Cardiovascular:  ?   Rate and Rhythm: Normal rate and regular rhythm.  ?   Heart sounds: No murmur heard. ?  No friction rub. No gallop.  ?Pulmonary:  ?   Effort: Pulmonary effort is normal.  ?   Breath sounds: Normal breath sounds.  ?Abdominal:  ?   General: Abdomen is flat. Bowel sounds are normal.  ?   Palpations: Abdomen is soft.  ?Musculoskeletal:     ?   General: No swelling. Normal range of motion.  ?   Cervical back: Normal range of motion and neck supple.  ?Skin: ?   General: Skin is warm and dry.  ?Neurological:  ?   General: No focal deficit present.  ?   Mental Status: He is oriented to person, place, and time.  ?Psychiatric:     ?   Mood and Affect: Mood normal.  ? ? ?Lab Results ?Lab Results  ?Component Value Date  ? WBC 15.0 (H) 12/22/2021  ? HGB 11.1 (L) 12/22/2021  ? HCT 34.7 (L) 12/22/2021  ? MCV 84.4 12/22/2021  ? PLT 138 (L) 12/22/2021  ?  ?Lab Results  ?Component Value Date  ? CREATININE 1.19 12/22/2021  ? BUN 20 12/22/2021  ? NA 134 (L) 12/22/2021  ? K 3.7 12/22/2021  ? CL 103 12/22/2021  ? CO2 25 12/22/2021  ?  ?Lab Results  ?Component Value Date  ? ALT 30 12/22/2021  ? AST 15 12/22/2021  ? ALKPHOS 73  12/22/2021  ? BILITOT 1.6 (H) 12/22/2021  ?  ? ? ? ?Laurice Record, MD ?[facility] ?[facility] ?12/22/2021, 3:16 PM  ?

## 2021-12-23 ENCOUNTER — Inpatient Hospital Stay (HOSPITAL_COMMUNITY): Payer: Medicare HMO

## 2021-12-23 DIAGNOSIS — Z950 Presence of cardiac pacemaker: Secondary | ICD-10-CM | POA: Insufficient documentation

## 2021-12-23 DIAGNOSIS — R7881 Bacteremia: Secondary | ICD-10-CM

## 2021-12-23 DIAGNOSIS — R6520 Severe sepsis without septic shock: Secondary | ICD-10-CM | POA: Diagnosis not present

## 2021-12-23 DIAGNOSIS — B952 Enterococcus as the cause of diseases classified elsewhere: Secondary | ICD-10-CM

## 2021-12-23 DIAGNOSIS — A419 Sepsis, unspecified organism: Secondary | ICD-10-CM | POA: Diagnosis not present

## 2021-12-23 LAB — URINE CULTURE: Culture: NO GROWTH

## 2021-12-23 LAB — ECHOCARDIOGRAM COMPLETE
AR max vel: 2.72 cm2
AV Area VTI: 2.56 cm2
AV Area mean vel: 2.63 cm2
AV Mean grad: 3 mmHg
AV Peak grad: 6 mmHg
Ao pk vel: 1.22 m/s
Area-P 1/2: 2.91 cm2
MV VTI: 2.11 cm2
S' Lateral: 3.7 cm

## 2021-12-23 LAB — GLUCOSE, CAPILLARY
Glucose-Capillary: 124 mg/dL — ABNORMAL HIGH (ref 70–99)
Glucose-Capillary: 165 mg/dL — ABNORMAL HIGH (ref 70–99)
Glucose-Capillary: 120 mg/dL — ABNORMAL HIGH (ref 70–99)
Glucose-Capillary: 147 mg/dL — ABNORMAL HIGH (ref 70–99)
Glucose-Capillary: 149 mg/dL — ABNORMAL HIGH (ref 70–99)

## 2021-12-23 LAB — PROTIME-INR
INR: 1.2 (ref 0.8–1.2)
Prothrombin Time: 14.8 seconds (ref 11.4–15.2)

## 2021-12-23 MED ORDER — PERFLUTREN LIPID MICROSPHERE
1.0000 mL | INTRAVENOUS | Status: AC | PRN
  Administered 2021-12-23: 2 mL via INTRAVENOUS
  Filled 2021-12-23: qty 10

## 2021-12-23 NOTE — Progress Notes (Addendum)
? ?PROGRESS NOTE ? ?Jason Jarvis HYI:[id number] DOB: Jun 01, 1951 DOA: 12/21/2021 ?PCP: Jeni Salles, MD ? ?HPI/Recap of past 24 hours: ?  71 y.o. male with medical history significant for COPD, coronary artery disease, remote history of seizure off Keppra for at least 8 years, nephrolithiasis requiring lithotripsy, CKD 2, chronic anxiety/depression paroxysmal A-fib not on oral anticoagulation, chronic diastolic CHF, status post cardiac pacemaker placement, right renal cell carcinoma status post renal cryoablation 12/06/2015, left knee total replacement in 2019, who presented to [facility] from home due to altered mental status, scrotal pain, flank pain and fever.  Patient works night shift and came home morning of 12/21/2021.  His wife noticed that he felt warm.  Associated with mild confusion, scrotal, and bilateral flank pain.  Per his wife via phone, he has complained of scrotal pain for the past few days.  He made his PCP aware, and an exam was done in the office, it was benign.  UA negative.  CT head nonacute.  CT abdomen and pelvis showing no acute intra-abdominal or intrapelvic process.  Showing post therapeutic changes from cryoablation of a right renal cell carcinoma.  No evidence of local recurrence.  Chest x-ray no active disease.  No clear source of infection, but due to concern for an active infective process with fever of Tmax 102.6, respiratory rate 38 and WBC 15.1K, patient was started on IV antibiotics empirically in the ED, [place].  EDP requested admission for further evaluation and management.  Patient was accepted by the hospitalist service, Dr. Alcario Drought, [facility]  Admitted as a direct admission to [facility] long hospital progressive unit. ?  ?4/16- no obvious source of infection elucidated. US scrotum remarkable for microcalcifications on R testicle without abscess, fluid or sign of infection. ?Await blood cultures, send ESR/CRP. Consider DVT as source of fever, fatigue/AMS. Will  obtain US BLE ? ?  ?Notable for dvt L lower, unsure duration based on read-- will initiate lovenox and transition to [place] when encephalopathy clinically improves ?  ?E- faecalis blood culture- source unknown, most likely GI is driving this, but await urine culture, ID will be consulted. Empiric antibiotics switched to ampicillin with discussion to our pharmacy team. Antibiotic susceptibility to follow-- continue to monitor clinically and if worsens can broaden overall.  ?  ?12/23/2021: Patient was seen and examined with his wife at his bedside.  Denies any pain in his legs.  No chest pain.  He has no new complaints. ? ? ?Assessment/Plan: ?Principal Problem: ?  Sepsis ([place]) ?Active Problems: ?  Atrial fibrillation, chronic ([place]) ?  Acute on chronic systolic CHF (congestive heart failure) ([place]) ?  Scrotal pain ?  Coronary artery disease ?  Type 2 diabetes mellitus ([place]) ?  Hyponatremia ?  Acute metabolic encephalopathy ?  Altered mental state ? ?Sepsis ([place]) E. Faecalis bacteremia ?Tmax 102.6, leukocytosis 15,000, tachypnea respiratory rate 38, no clear source of infection. ?UA, chest x-ray, CT scan abdomen and pelvis, unrevealing. ?US scrotum without obvious source of fever.  ?Blood cultures NGTD ?Urine Cultures NGTD ?Has PPM in placebut does not appear to be involved in infection ?- Scrotal US without source of infection ?- h/o cancer consider DVT as cause of fever but generally does not cause AMS ?Continue empiric IV antibiotics until source of infection has resulted ?Monitor fever curve and WBC ?Not unreasonable to obtain TTE, although if bl cx +, TEE would have larger yield ? ?E. faecalis bacteremia, POA ?2D echo showing no evidence of infective endocarditis. ?We  will consult cardiology for possible TEE in light of pacemaker placement. ?Unasyn ?Rest of management per ID ?Follow repeat blood cultures ?Per ID final antibiotic recommendation depends on echo results ? ?Left age indeterminate DVT ?Currently on full  dose Lovenox, continue ?   ?Resolved acute metabolic encephalopathy ?CT head negative ?  ?Hyponatremia, improving ?Most likely hypovolemic given presentation. ?AKI improving with IVF resuscitation, and sodium normalizing. ?Will send off Urine osm, na if worsening. Do not believe is causing AMS ?  ?Type 2 diabetes mellitus ([place]) ?A1C 6.5%, SSI for now ?  ?Coronary artery disease ?No anginal symptoms ?Resume home aspirin and Lipitor ?Monitor on telemetry ?  ?Scrotal pain ?No evidence of cystitis, epididymitis on exam/labwork. ?US Scrotum with microcalcification on R scrotum, unsure significance in setting of RCC ?Analgesics as needed ?  ?Acute on chronic systolic CHF (congestive heart failure) ([place]) ?No recent Echo on file, appears euvolemic ?F/u TTE today ?Resume HTN medications as clinically appropraite if no hypotension/shock ?Monitor strict I's and O's and daily weight ?  ?Atrial fibrillation, chronic ([place]) ?Euvolemic; Resume home regimen; Hold off home Lasix;  ?Closely monitor volume status while on judicious IV fluid hydration. ?Continue to strict I's and O's and daily weight ?  ?Physical debility ?Continue PT OT with assistance and fall precautions. ?  ?  ? ?Code Status: Full code ? ?Family Communication: His wife at bedside ? ?Disposition Plan: Likely will discharge to home once hemodynamically stable ? ? ?Consultants: ?Infectious disease ? ?Procedures: ?None ? ?Antimicrobials: ?Ampicillin ? ?DVT prophylaxis: ?Subcu Lovenox full dose twice daily. ? ?Status is: Inpatient ?Patient requires at least 2 midnights for further evaluation and treatment of present condition. ? ? ? ?Objective: ?Vitals:  ? 12/22/21 2129 12/23/21 0423 12/23/21 0429 12/23/21 1354  ?BP: (!) 157/75 (!) 150/77  139/66  ?Pulse: 63 60  62  ?Resp: (!) 28 (!) 24  18  ?Temp: 99.6 ?F (37.6 ?C) 99.2 ?F (37.3 ?C)  97.9 ?F (36.6 ?C)  ?TempSrc: Oral Oral  Oral  ?SpO2: 95% 95%  94%  ?Weight:   90.1 kg   ?Height:      ? ? ?Intake/Output Summary (Last 24  hours) at 12/23/2021 1503 ?Last data filed at 12/23/2021 0536 ?Gross per 24 hour  ?Intake 1451.13 ml  ?Output 600 ml  ?Net 851.13 ml  ? ?Filed Weights  ? 12/21/21 1956 12/22/21 0149 12/23/21 0429  ?Weight: 91.2 kg 91 kg 90.1 kg  ? ? ?Exam: ? ?General: 71 y.o. year-old male well developed well nourished in no acute distress.  Alert and oriented x3. ?Cardiovascular: Regular rate and rhythm with no rubs or gallops.  No thyromegaly or JVD noted.   ?Respiratory: Clear to auscultation with no wheezes or rales. Good inspiratory effort. ?Abdomen: Soft nontender nondistended with normal bowel sounds x4 quadrants. ?Musculoskeletal: No lower extremity edema. 2/4 pulses in all 4 extremities. ?Skin: No ulcerative lesions noted or rashes, ?Psychiatry: Mood is appropriate for condition and setting ? ? ?Data Reviewed: ?CBC: ?Recent Labs  ?Lab 12/21/21 ?2033 12/22/21 ?0346 12/22/21 ?0806  ?WBC 15.1* 15.4* 15.0*  ?HGB 12.3* 11.1* 11.1*  ?HCT 37.9* 35.1* 34.7*  ?MCV 82.8 84.2 84.4  ?PLT 157 129* 138*  ? ?Basic Metabolic Panel: ?Recent Labs  ?Lab 12/21/21 ?2033 12/22/21 ?0346  ?NA 132* 134*  ?K 3.8 3.7  ?CL 99 103  ?CO2 25 25  ?GLUCOSE 153* 161*  ?BUN 20 20  ?CREATININE 1.42* 1.19  ?CALCIUM 8.3* 8.1*  ?MG  --  1.9  ?PHOS  --  3.0  ? ?GFR: ?Estimated Creatinine Clearance: 61.4 mL/min (by C-G formula based on SCr of 1.19 mg/dL). ?Liver Function Tests: ?Recent Labs  ?Lab 12/21/21 ?2033 12/22/21 ?0346  ?AST 19 15  ?ALT 20 30  ?ALKPHOS 86 73  ?BILITOT 1.2 1.6*  ?PROT 6.5 5.7*  ?ALBUMIN 3.5 3.0*  ? ?No results for input(s): LIPASE, AMYLASE in the last 168 hours. ?Recent Labs  ?Lab 12/22/21 ?1419  ?AMMONIA 16  ? ?Coagulation Profile: ?Recent Labs  ?Lab 12/23/21 ?4301  ?INR 1.2  ? ?Cardiac Enzymes: ?No results for input(s): CKTOTAL, CKMB, CKMBINDEX, TROPONINI in the last 168 hours. ?BNP (last 3 results) ?No results for input(s): PROBNP in the last 8760 hours. ?HbA1C: ?Recent Labs  ?  12/22/21 ?0346  ?HGBA1C 6.5*  ? ?CBG: ?Recent Labs  ?Lab  12/22/21 ?1115 12/22/21 ?1638 12/22/21 ?2130 12/23/21 ?4840 12/23/21 ?1142  ?GLUCAP 177* 126* 142* 124* 165*  ? ?Lipid Profile: ?No results for input(s): CHOL, HDL, LDLCALC, TRIG, CHOLHDL, LDLDIRECT in the last 7

## 2021-12-23 NOTE — Progress Notes (Signed)
?   ? ? ? ? ?[facility] for Infectious Disease ? ?Date of Admission:  12/21/2021    ? ?Lines: ?Peripheral iv's ? ?Abx: ?4/16-c ampicillin ? ?4/15-16 meropenem ? ?ASSESSMENT: ?71 year old male hx mrsa bacteremia with pacer infection s/p removal and appropriate abx course in 2018, reimplanted pacer 11/2017, hx renal cell cancer s/p cryoablation 2017, hx right knee replacement 2019, afib not on anticoagulation, admitted 4/15 with sepsis/ams found to have e faecalis bacteremia ? ?#E faecalis bacteremia secondary to unclear source ?#Right TKA in 2019 -- doesn't appeared symptomatic ?#Hx of MRSA bacteremia and PPM infection SP explantation a nd re-implantation ?Has doppler evaluation left LE that showed age-indeterminate dvt left posterior tibial vein  ?Ct left foot (foot pain) showed no acute osseous abnormality or abscess ?4/15 bcx e faecalis 4 of 4 bottles ?4/16 bxc ngtd ?4/17 tte in progress ? ? ?PLAN: ?Continue ampicillin ?F/u tte result ?Recommend getting TEE as well ?Final abx recs depending on echo result ?Discussed with primary team ? ?Principal Problem: ?  Sepsis ([place]) ?Active Problems: ?  Atrial fibrillation, chronic ([place]) ?  Acute on chronic systolic CHF (congestive heart failure) ([place]) ?  Scrotal pain ?  Coronary artery disease ?  Type 2 diabetes mellitus ([place]) ?  Hyponatremia ?  Acute metabolic encephalopathy ?  Altered mental state ? ? ?Allergies  ?Allergen Reactions  ? Cephalexin Hives  ? Lorazepam Other (See Comments)  ?  Hallucinated / Confused / Combative  ? Penicillins Hives  ? Ace Inhibitors Cough  ?   ?  ? Prednisone Rash  ? Sulfa Antibiotics Rash  ? ? ?Scheduled Meds: ? amLODipine  2.5 mg Oral Daily  ? aspirin EC  81 mg Oral Daily  ? atorvastatin  40 mg Oral Daily  ? doxazosin  2 mg Oral QHS  ? enoxaparin (LOVENOX) injection  1 mg/kg Subcutaneous Q12H  ? insulin aspart  0-5 Units Subcutaneous QHS  ? insulin aspart  0-9 Units Subcutaneous TID WC  ? ?Continuous Infusions: ? sodium chloride Stopped  (12/21/21 2153)  ? ampicillin (OMNIPEN) IV 2 g (12/23/21 1006)  ? ?PRN Meds:.sodium chloride, acetaminophen, melatonin, ondansetron (ZOFRAN) IV, oxyCODONE, polyethylene glycol ? ? ?SUBJECTIVE: ?Oob to chair ?Feelilng much better ?No focal pain ?No n/v/diarrhea ?No headache ?No focal weakness ? ?Review of Systems: ?ROS ?All other ROS was negative, except mentioned above ? ? ? ? ?OBJECTIVE: ?Vitals:  ? 12/22/21 2129 12/23/21 0423 12/23/21 0429 12/23/21 1354  ?BP: (!) 157/75 (!) 150/77  139/66  ?Pulse: 63 60  62  ?Resp: (!) 28 (!) 24  18  ?Temp: 99.6 ?F (37.6 ?C) 99.2 ?F (37.3 ?C)  97.9 ?F (36.6 ?C)  ?TempSrc: Oral Oral  Oral  ?SpO2: 95% 95%  94%  ?Weight:   90.1 kg   ?Height:      ? ?Body mass index is 29.34 kg/m?. ? ?Physical Exam ?General/constitutional: no distress, pleasant ?HEENT: Normocephalic, PER, Conj Clear, EOMI, Oropharynx clear ?Neck supple ?CV: rrr no mrg ?Lungs: clear to auscultation, normal respiratory effort ?Abd: Soft, Nontender ?Ext: no edema ?Skin: No Rash ?Neuro: nonfocal ?MSK: no peripheral joint swelling/tenderness/warmth; back spines nontender ? ? ? ?Lab Results ?Lab Results  ?Component Value Date  ? WBC 15.0 (H) 12/22/2021  ? HGB 11.1 (L) 12/22/2021  ? HCT 34.7 (L) 12/22/2021  ? MCV 84.4 12/22/2021  ? PLT 138 (L) 12/22/2021  ?  ?Lab Results  ?Component Value Date  ? CREATININE 1.19 12/22/2021  ? BUN 20 12/22/2021  ? NA  134 (L) 12/22/2021  ? K 3.7 12/22/2021  ? CL 103 12/22/2021  ? CO2 25 12/22/2021  ?  ?Lab Results  ?Component Value Date  ? ALT 30 12/22/2021  ? AST 15 12/22/2021  ? ALKPHOS 73 12/22/2021  ? BILITOT 1.6 (H) 12/22/2021  ?  ? ? ?Microbiology: ?Recent Results (from the past 240 hour(s))  ?Blood culture (routine x 2)     Status: Abnormal (Preliminary result)  ? Collection Time: 12/21/21  8:33 PM  ? Specimen: BLOOD  ?Result Value Ref Range Status  ? Specimen Description   Final  ?  BLOOD RIGHT ANTECUBITAL ?Performed at [facility], [address] [place], [place]  [address] ?  ? Special Requests   Final  ?  BOTTLES DRAWN AEROBIC AND ANAEROBIC Blood Culture adequate volume ?Performed at [facility], [address], [place], [place] [address] ?  ? Culture  Setup Time   Final  ?  GRAM POSITIVE COCCI ?IN BOTH AEROBIC AND ANAEROBIC BOTTLES ?CRITICAL RESULT CALLED TO, READ BACK BY AND VERIFIED WITH: Honor [id number] FCP ?  ? Culture (A)  Final  ?  ENTEROCOCCUS FAECALIS ?SUSCEPTIBILITIES TO FOLLOW ?Performed at [facility], [place] [address] [place], [place] [address] ?  ? Report Status PENDING  Incomplete  ?Resp Panel by RT-PCR (Flu A&B, Covid) Nasopharyngeal Swab     Status: None  ? Collection Time: 12/21/21  8:33 PM  ? Specimen: Nasopharyngeal Swab; Nasopharyngeal(NP) swabs in vial transport medium  ?Result Value Ref Range Status  ? SARS Coronavirus 2 by RT PCR NEGATIVE NEGATIVE Final  ?  Comment: (NOTE) ?SARS-CoV-2 target nucleic acids are NOT DETECTED. ? ?The SARS-CoV-2 RNA is generally detectable in upper respiratory ?specimens during the acute phase of infection. The lowest ?concentration of SARS-CoV-2 viral copies this assay can detect is ?138 copies/mL. A negative result does not preclude SARS-Cov-2 ?infection and should not be used as the sole basis for treatment or ?other patient management decisions. A negative result may occur with  ?improper specimen collection/handling, submission of specimen other ?than nasopharyngeal swab, presence of viral mutation(s) within the ?areas targeted by this assay, and inadequate number of viral ?copies(<138 copies/mL). A negative result must be combined with ?clinical observations, patient history, and epidemiological ?information. The expected result is Negative. ? ?Fact Sheet for Patients:  ?EntrepreneurPulse.com.au ? ?Fact Sheet for Healthcare Providers:  ?IncredibleEmployment.be ? ?This test is no t yet approved or cleared by the [place] FDA and  ?has been  authorized for detection and/or diagnosis of SARS-CoV-2 by ?FDA under an Emergency Use Authorization (EUA). This EUA will remain  ?in effect (meaning this test can be used) for the duration of the ?COVID-19 declaration under Section 564(b)(1) of the Act, 21 ?U.S.C.section 360bbb-3(b)(1), unless the authorization is terminated  ?or revoked sooner.  ? ? ?  ? Influenza A by PCR NEGATIVE NEGATIVE Final  ? Influenza B by PCR NEGATIVE NEGATIVE Final  ?  Comment: (NOTE) ?The Xpert Xpress SARS-CoV-2/FLU/RSV plus assay is intended as an aid ?in the diagnosis of influenza from Nasopharyngeal swab specimens and ?should not be used as a sole basis for treatment. Nasal washings and ?aspirates are unacceptable for Xpert Xpress SARS-CoV-2/FLU/RSV ?testing. ? ?Fact Sheet for Patients: ?EntrepreneurPulse.com.au ? ?Fact Sheet for Healthcare Providers: ?IncredibleEmployment.be ? ?This test is not yet approved or cleared by the [place] FDA and ?has been authorized for detection and/or diagnosis of SARS-CoV-2 by ?FDA under an  Emergency Use Authorization (EUA). This EUA will remain ?in effect (meaning this test can be used) for the duration of the ?COVID-19 declaration under Section 564(b)(1) of the Act, 21 U.S.C. ?section 360bbb-3(b)(1), unless the authorization is terminated or ?revoked. ? ?Performed at [facility], [address] [place], [place] [address] ?  ?Blood Culture ID Panel (Reflexed)     Status: Abnormal  ? Collection Time: 12/21/21  8:33 PM  ?Result Value Ref Range Status  ? Enterococcus faecalis DETECTED (A) NOT DETECTED Final  ?  Comment: CRITICAL RESULT CALLED TO, READ BACK BY AND VERIFIED WITH: ?PHARMD ABuford Dresser [id number] FCP ?  ? Enterococcus Faecium NOT DETECTED NOT DETECTED Final  ? Listeria monocytogenes NOT DETECTED NOT DETECTED Final  ? Staphylococcus species NOT DETECTED NOT DETECTED Final  ? Staphylococcus aureus (BCID) NOT DETECTED NOT DETECTED Final   ? Staphylococcus epidermidis NOT DETECTED NOT DETECTED Final  ? Staphylococcus lugdunensis NOT DETECTED NOT DETECTED Final  ? Streptococcus species NOT DETECTED NOT DETECTED Final  ? Streptococcus agalactiae NOT

## 2021-12-23 NOTE — Progress Notes (Signed)
PHARMACY NOTE -  Enoxaparin ? ?Pharmacy has been assisting with dosing of enoxaparin for new DVT. ?Dosage remains stable at 1 mg/kg SQ bid and need for further dosage adjustment appears unlikely at present given SCr improved to near baseline. ? ?Pharmacy will sign off, following peripherally for dose adjustments. Please reconsult if a change in clinical status warrants re-evaluation of dosage. ? ?Reuel Boom, PharmD, BCPS ?[id number] ?12/23/2021, 2:09 PM ? ? ? ?

## 2021-12-24 DIAGNOSIS — R6520 Severe sepsis without septic shock: Secondary | ICD-10-CM | POA: Diagnosis not present

## 2021-12-24 DIAGNOSIS — A419 Sepsis, unspecified organism: Secondary | ICD-10-CM | POA: Diagnosis not present

## 2021-12-24 LAB — BLOOD CULTURE ID PANEL (REFLEXED) - BCID2

## 2021-12-24 LAB — CULTURE, BLOOD (ROUTINE X 2): Special Requests: ADEQUATE

## 2021-12-24 LAB — PROTIME-INR
INR: 1.1 (ref 0.8–1.2)
Prothrombin Time: 13.8 seconds (ref 11.4–15.2)

## 2021-12-24 LAB — GLUCOSE, CAPILLARY
Glucose-Capillary: 114 mg/dL — ABNORMAL HIGH (ref 70–99)
Glucose-Capillary: 146 mg/dL — ABNORMAL HIGH (ref 70–99)
Glucose-Capillary: 147 mg/dL — ABNORMAL HIGH (ref 70–99)
Glucose-Capillary: 201 mg/dL — ABNORMAL HIGH (ref 70–99)

## 2021-12-24 MED ORDER — SODIUM CHLORIDE 0.9 % IV SOLN
2.0000 g | Freq: Two times a day (BID) | INTRAVENOUS | Status: DC
  Administered 2021-12-24 – 2022-01-01 (×18): 2 g via INTRAVENOUS
  Filled 2021-12-24 (×19): qty 20

## 2021-12-24 MED ORDER — SENNOSIDES-DOCUSATE SODIUM 8.6-50 MG PO TABS
2.0000 | ORAL_TABLET | Freq: Every day | ORAL | Status: DC
  Administered 2021-12-24 – 2022-01-01 (×8): 2 via ORAL
  Filled 2021-12-24 (×9): qty 2

## 2021-12-24 NOTE — Progress Notes (Addendum)
? ?PROGRESS NOTE ? ?Jason Jarvis SNK:[id number] DOB: [birth year] DOA: 12/21/2021 ?PCP: Jeni Salles, MD ? ?HPI/Recap of past 24 hours: ?71 y.o. 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  Patient works night shift and came home morning of 12/21/2021.  His wife noticed that he felt warm.  Fever with mild confusion, scrotal, and bilateral flank pain.  UA and urine culture negative.  CT head nonacute.  CT abdomen and pelvis showing no acute intra-abdominal or intrapelvic process.  Showing post therapeutic changes from cryoablation of a right renal cell carcinoma.  No evidence of local recurrence.  Chest x-ray no active disease.  No clear source of infection, but due to concern for an active infective process with fever of Tmax 102.6, respiratory rate 38 and WBC 15.1K, patient was started on IV antibiotics empirically in the ED, [place].  EDP requested admission for further evaluation and management.  Patient was accepted by the hospitalist service, Dr. Alcario Drought, [facility].  Admitted as a direct admission to [facility] long hospital progressive unit.  US scrotum remarkable for microcalcifications on R testicle without abscess, fluid or sign of infection. ? ?Blood culture from 12/21/2021 returned positive for Enterococcus faecalis.  Bilateral lower extremity Doppler ultrasound positive for DVT at left lower extremity.  Unsure duration based on read.  Infectious disease was consulted to assist with the management of his bacteremia.  2D echo  completed no evidence of infective endocarditis.  Cardiology consulted for TEE. ? ?12/24/2021: Patient was seen and examined at his bedside.  He has no new complaints.  There were no acute events overnight.  Repeated blood culture obtained on 4/16 returned positive for Enterococcus faecalis 1/3 bottles, no resistance detected. ? ? ?Assessment/Plan: ?Principal Problem: ?  Sepsis ([place]) ?Active Problems: ?  Atrial fibrillation, chronic ([place]) ?  Scrotal pain ?  Coronary artery disease ?  Type 2 diabetes mellitus ([place]) ?  Hyponatremia ?  Acute metabolic encephalopathy ?  Altered mental state ?  S/P placement of cardiac pacemaker ?  Enterococcus faecalis infection ? ?Sepsis ([place]) Enterococcus faecalis bacteremia ?Presented with Tmax 102.6, leukocytosis 15,000, tachypnea respiratory rate 38, urine culture no growth, blood cultures positive for Enterococcus faecalis. ?UA, chest x-ray, CT scan abdomen and pelvis, unrevealing. ?US scrotum without obvious source of fever.  ?Has PPM in place but does not appear to be involved in infection ?- Scrotal US without source of infection ?Continue antibiotics as recommended by infectious disease. ? ?Enterococcus faecalis bacteremia, POA ?2D echo showing no evidence of infective endocarditis. ?Cardiology consulted for possible TEE in light of pacemaker placement. ?Initial blood cultures positive, repeated blood cultures on 12/22/2021, returned positive 1 out of 3 bottles ?Infectious disease following. ?Due to persistent positive blood culture concern for infective endocarditis. ?Currently on ampicillin ? ?Left age indeterminate DVT ?Currently on full dose Lovenox, continue ?   ?Resolved acute metabolic encephalopathy likely secondary to sepsis ?CT head negative ?He is back to his baseline mentation. ?  ?Hyponatremia, improving ?Most likely hypovolemic given presentation. ?AKI improving with IVF resuscitation, and sodium normalizing. ?Will send off Urine osm, na if worsening. Do  not  believe is causing AMS ?Repeat labs ?  ?Type 2 diabetes mellitus ([place]) ?A1C 6.5%, SSI for now ?  ?Coronary artery disease ?No anginal symptoms ?Continue home aspirin and Lipitor ?Monitor on telemetry ?  ?Improved scrotal pain ?No evidence of cystitis, epididymitis on exam/labwork. ?US Scrotum with microcalcification on R scrotum, unsure significance in setting of RCC ?Analgesics as needed ?  ?Chronic diastolic CHF (congestive heart failure) ([place]) ?No recent Echo on file, appears euvolemic ?Continue HTN medications as clinically appropraite if no hypotension/shock ?Continue to monitor strict I's and O's and daily weight ?  ?Atrial fibrillation, chronic ([place]) ?Euvolemic; Resume home regimen; Hold off home Lasix;  ?Closely monitor volume status while on judicious IV fluid hydration. ?Continue to strict I's and O's and daily weight ?  ?Physical debility ?Continue PT OT with assistance and fall precautions. ?  ?  ? ?Code Status: Full code ? ?Family Communication: His wife at bedside ? ?Disposition Plan: Likely will discharge to home once hemodynamically stable ? ? ?Consultants: ?Infectious disease ? ?Procedures: ?None ? ?Antimicrobials: ?Ampicillin ? ?DVT prophylaxis: ?Subcu Lovenox full dose twice daily. ? ?Status is: Inpatient ?Patient requires at least 2 midnights for further evaluation and treatment of present condition. ? ? ? ?Objective: ?Vitals:  ? 12/24/21 2036 12/25/21 0613 12/25/21 0908 12/25/21 1945  ?BP: (!) 158/75 (!) 177/73 (!) 171/71 (!) 151/80  ?Pulse: 61 (!) 59 62 61  ?Resp: '18 18 17 20  '$ ?Temp: 98.5 ?F (36.9 ?C) 98.4 ?F (36.9 ?C) 98.1 ?F (36.7 ?C) 98.7 ?F (37.1 ?C)  ?TempSrc: Oral Oral Oral Oral  ?SpO2: 96% 96% 97% 98%  ?Weight:      ?Height:      ? ? ?Intake/Output Summary (Last 24 hours) at 12/25/2021 2341 ?Last data filed at 12/25/2021 1900 ?Gross per 24 hour  ?Intake 1684.43 ml  ?Output 3200 ml  ?Net -1515.57 ml  ? ?Filed Weights  ? 12/22/21 0149 12/23/21 0429 12/24/21 0500  ?Weight: 91 kg 90.1 kg 88 kg   ? ? ?Exam: ? ?General: 71 y.o. year-old male frail-appearing no acute distress.  He is alert and oriented x3.   ?Cardiovascular: Regular rate and rhythm no rubs or gallops.  ?Respiratory: Clear to auscultation no wheezes or rales. ?Abdomen: Soft nontender bowel sounds present.   ?Musculoskeletal: Trace lower extremity edema bilaterally.   ?Skin: No ulcerative lesions. ?Psychiatry: Mood is appropriate for condition and setting. ? ? ?Data Reviewed: ?CBC: ?Recent Labs  ?Lab 12/21/21 ?2033 12/22/21 ?0346 12/22/21 ?0806 12/25/21 ?0407  ?WBC 15.1* 15.4* 15.0* 5.9  ?HGB 12.3* 11.1* 11.1* 12.0*  ?HCT 37.9* 35.1* 34.7* 38.0*  ?MCV 82.8 84.2 84.4 84.4  ?PLT 157 129* 138* 160  ? ?Basic Metabolic Panel: ?Recent Labs  ?Lab 12/21/21 ?2033 12/22/21 ?0346 12/25/21 ?0407  ?NA 132* 134* 138  ?K 3.8 3.7 4.6  ?CL 99 103 107  ?CO2 '25 25 24  '$ ?GLUCOSE 153* 161* 118*  ?BUN '20 20 21  '$ ?CREATININE 1.42* 1.19 1.09  ?CALCIUM 8.3* 8.1* 8.8*  ?MG  --  1.9 2.1  ?PHOS  --  3.0 4.1  ? ?GFR: ?Estimated Creatinine Clearance: 66.2 mL/min (by C-G formula based on SCr of 1.09 mg/dL). ?Liver Function Tests: ?Recent Labs  ?Lab 12/21/21 ?2033 12/22/21 ?0346  ?AST 19 15  ?ALT 20 30  ?ALKPHOS 86 73  ?BILITOT 1.2 1.6*  ?PROT 6.5 5.7*  ?ALBUMIN 3.5 3.0*  ? ?No results for input(s): LIPASE, AMYLASE in the last 168 hours. ?Recent Labs  ?Lab 12/22/21 ?1419  ?  AMMONIA 16  ? ?Coagulation Profile: ?Recent Labs  ?Lab 12/23/21 ?0344 12/24/21 ?0332 12/25/21 ?0407  ?INR 1.2 1.1 1.0  ? ?Cardiac Enzymes: ?No results for input(s): CKTOTAL, CKMB, CKMBINDEX, TROPONINI in the last 168 hours. ?BNP (last 3 results) ?No results for input(s): PROBNP in the last 8760 hours. ?HbA1C: ?No results for input(s): HGBA1C in the last 72 hours. ? ?CBG: ?Recent Labs  ?Lab 12/25/21 ?0752 12/25/21 ?1055 12/25/21 ?1615 12/25/21 ?1821 12/25/21 ?1946  ?GLUCAP 120* 179* 122* 195* 192*  ? ?Lipid Profile: ?No results for input(s): CHOL, HDL, LDLCALC, TRIG, CHOLHDL, LDLDIRECT in the last 72  hours. ?Thyroid Function Tests: ?No results for input(s): TSH, T4TOTAL, FREET4, T3FREE, THYROIDAB in the last 72 hours. ?Anemia Panel: ?No results for input(s): VITAMINB12, FOLATE, FERRITIN, TIBC, IRON, RETICCTPCT

## 2021-12-24 NOTE — Progress Notes (Signed)
PHARMACY - PHYSICIAN COMMUNICATION ?CRITICAL VALUE ALERT - BLOOD CULTURE IDENTIFICATION (BCID) ? ?Jason Jarvis is an 71 y.o. male who presented to [facility] on 12/21/2021. ? ?Assessment:  Patient currently being following by ID for E. faecalis bacteremia 2/2 unclear source.  ?Repeat BCx obtained 4/16 + 1/3 E. faecalis (no resistance detected) ? ?Name of physician (or Provider) Contacted: Dr. Nevada Crane ? ?Current antibiotics: Ampicillin 2 g IV q4h ? ?Changes to prescribed antibiotics recommended: None - patient is on appropriate antimicrobial therapy at this time. ? ?Results for orders placed or performed during the hospital encounter of 12/21/21  ?Blood Culture ID Panel (Reflexed) (Collected: 12/22/2021  3:45 PM)  ?Result Value Ref Range  ? Enterococcus faecalis DETECTED (A) NOT DETECTED  ? Enterococcus Faecium NOT DETECTED NOT DETECTED  ? Listeria monocytogenes NOT DETECTED NOT DETECTED  ? Staphylococcus species NOT DETECTED NOT DETECTED  ? Staphylococcus aureus (BCID) NOT DETECTED NOT DETECTED  ? Staphylococcus epidermidis NOT DETECTED NOT DETECTED  ? Staphylococcus lugdunensis NOT DETECTED NOT DETECTED  ? Streptococcus species NOT DETECTED NOT DETECTED  ? Streptococcus agalactiae NOT DETECTED NOT DETECTED  ? Streptococcus pneumoniae NOT DETECTED NOT DETECTED  ? Streptococcus pyogenes NOT DETECTED NOT DETECTED  ? A.calcoaceticus-baumannii NOT DETECTED NOT DETECTED  ? Bacteroides fragilis NOT DETECTED NOT DETECTED  ? Enterobacterales NOT DETECTED NOT DETECTED  ? Enterobacter cloacae complex NOT DETECTED NOT DETECTED  ? Escherichia coli NOT DETECTED NOT DETECTED  ? Klebsiella aerogenes NOT DETECTED NOT DETECTED  ? Klebsiella oxytoca NOT DETECTED NOT DETECTED  ? Klebsiella pneumoniae NOT DETECTED NOT DETECTED  ? Proteus species NOT DETECTED NOT DETECTED  ? Salmonella species NOT DETECTED NOT DETECTED  ? Serratia marcescens NOT DETECTED NOT DETECTED  ? Haemophilus influenzae NOT DETECTED NOT DETECTED  ? Neisseria  meningitidis NOT DETECTED NOT DETECTED  ? Pseudomonas aeruginosa NOT DETECTED NOT DETECTED  ? Stenotrophomonas maltophilia NOT DETECTED NOT DETECTED  ? Candida albicans NOT DETECTED NOT DETECTED  ? Candida auris NOT DETECTED NOT DETECTED  ? Candida glabrata NOT DETECTED NOT DETECTED  ? Candida krusei NOT DETECTED NOT DETECTED  ? Candida parapsilosis NOT DETECTED NOT DETECTED  ? Candida tropicalis NOT DETECTED NOT DETECTED  ? Cryptococcus neoformans/gattii NOT DETECTED NOT DETECTED  ? Vancomycin resistance NOT DETECTED NOT DETECTED  ? ? ?Lenis Noon, PharmD ?12/24/2021  2:56 PM ?

## 2021-12-24 NOTE — Progress Notes (Signed)
Id brief note ? ? ?Repeat bcx with e faecalis again ?Called cards for TEE -- earliest can do is next week ? ? ? 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 ?Tte negative ? ? ?-repeat bcx ?-tee next week (cards called) ?-continue ampicillin. 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 ? ?

## 2021-12-25 DIAGNOSIS — E871 Hypo-osmolality and hyponatremia: Secondary | ICD-10-CM

## 2021-12-25 DIAGNOSIS — B952 Enterococcus as the cause of diseases classified elsewhere: Secondary | ICD-10-CM | POA: Diagnosis not present

## 2021-12-25 DIAGNOSIS — E119 Type 2 diabetes mellitus without complications: Secondary | ICD-10-CM

## 2021-12-25 DIAGNOSIS — A419 Sepsis, unspecified organism: Secondary | ICD-10-CM | POA: Diagnosis not present

## 2021-12-25 DIAGNOSIS — I5023 Acute on chronic systolic (congestive) heart failure: Secondary | ICD-10-CM | POA: Diagnosis not present

## 2021-12-25 DIAGNOSIS — G9341 Metabolic encephalopathy: Secondary | ICD-10-CM

## 2021-12-25 LAB — GLUCOSE, CAPILLARY
Glucose-Capillary: 120 mg/dL — ABNORMAL HIGH (ref 70–99)
Glucose-Capillary: 179 mg/dL — ABNORMAL HIGH (ref 70–99)
Glucose-Capillary: 122 mg/dL — ABNORMAL HIGH (ref 70–99)
Glucose-Capillary: 195 mg/dL — ABNORMAL HIGH (ref 70–99)
Glucose-Capillary: 192 mg/dL — ABNORMAL HIGH (ref 70–99)

## 2021-12-25 LAB — BASIC METABOLIC PANEL
Anion gap: 7 (ref 5–15)
BUN: 21 mg/dL (ref 8–23)
CO2: 24 mmol/L (ref 22–32)
Calcium: 8.8 mg/dL — ABNORMAL LOW (ref 8.9–10.3)
Chloride: 107 mmol/L (ref 98–111)
Creatinine, Ser: 1.09 mg/dL (ref 0.61–1.24)
GFR, Estimated: >60 mL/min (ref >60)
Glucose, Bld: 118 mg/dL — ABNORMAL HIGH (ref 70–99)
Potassium: 4.6 mmol/L (ref 3.5–5.1)
Sodium: 138 mmol/L (ref 135–145)

## 2021-12-25 LAB — CBC
HCT: 38 % — ABNORMAL LOW (ref 39.0–52.0)
Hemoglobin: 12 g/dL — ABNORMAL LOW (ref 13.0–17.0)
MCH: 26.7 pg (ref 26.0–34.0)
MCHC: 31.6 g/dL (ref 30.0–36.0)
MCV: 84.4 fL (ref 80.0–100.0)
Platelets: 160 10*3/uL (ref 150–400)
RBC: 4.5 MIL/uL (ref 4.22–5.81)
RDW: 14.8 % (ref 11.5–15.5)
WBC: 5.9 10*3/uL (ref 4.0–10.5)
nRBC: 0 % (ref 0.0–0.2)

## 2021-12-25 LAB — PHOSPHORUS: Phosphorus: 4.1 mg/dL (ref 2.5–4.6)

## 2021-12-25 LAB — PROTIME-INR
INR: 1 (ref 0.8–1.2)
Prothrombin Time: 13 seconds (ref 11.4–15.2)

## 2021-12-25 LAB — MAGNESIUM: Magnesium: 2.1 mg/dL (ref 1.7–2.4)

## 2021-12-25 MED ORDER — LOSARTAN POTASSIUM 50 MG PO TABS
50.0000 mg | ORAL_TABLET | Freq: Every day | ORAL | Status: DC
  Administered 2021-12-25 – 2022-01-01 (×8): 50 mg via ORAL
  Filled 2021-12-25 (×8): qty 1

## 2021-12-25 MED ORDER — PANTOPRAZOLE SODIUM 40 MG PO TBEC
40.0000 mg | DELAYED_RELEASE_TABLET | Freq: Two times a day (BID) | ORAL | Status: DC
  Administered 2021-12-25 – 2021-12-29 (×8): 40 mg via ORAL
  Filled 2021-12-25 (×10): qty 1

## 2021-12-25 MED ORDER — METOPROLOL SUCCINATE ER 50 MG PO TB24
50.0000 mg | ORAL_TABLET | Freq: Every day | ORAL | Status: DC
  Administered 2021-12-25 – 2021-12-28 (×4): 50 mg via ORAL
  Filled 2021-12-25 (×4): qty 1

## 2021-12-25 MED ORDER — TRAMADOL HCL 50 MG PO TABS
50.0000 mg | ORAL_TABLET | Freq: Two times a day (BID) | ORAL | Status: DC | PRN
Start: 2021-12-25 — End: 2022-01-01
  Administered 2021-12-30 – 2021-12-31 (×2): 50 mg via ORAL
  Filled 2021-12-25 (×2): qty 1

## 2021-12-25 MED ORDER — NITROGLYCERIN 0.4 MG SL SUBL: 0.4000 mg | SUBLINGUAL_TABLET | SUBLINGUAL | Status: DC | PRN

## 2021-12-25 MED ORDER — FUROSEMIDE 40 MG PO TABS
40.0000 mg | ORAL_TABLET | Freq: Every day | ORAL | Status: DC
  Administered 2021-12-25 – 2022-01-01 (×8): 40 mg via ORAL
  Filled 2021-12-25 (×8): qty 1

## 2021-12-25 NOTE — TOC Progression Note (Signed)
Transition of Care (TOC) - Progression Note  ? ? ?Patient Details  ?Name: Jason Jarvis ?MRN: [id number] ?Date of Birth: 1951-03-08 ? ?Transition of Care (TOC) CM/SW Contact  ?Purcell Mouton, RN ?Phone Number: ?12/25/2021, 3:05 PM ? ?Clinical Narrative:    ?Pt from home with spouse. TOC will continue to follow.  ? ? ?Expected Discharge Plan: Home/Self Care ?Barriers to Discharge: No Barriers Identified ? ?Expected Discharge Plan and Services ?Expected Discharge Plan: Home/Self Care ?  ?Discharge Planning Services: CM Consult ?Post Acute Care Choice: Home Health, [place] ?Living arrangements for the past 2 months: [place] ?                ?  ?  ?  ?  ?  ?  ?  ?  ?  ?  ? ? ?Social Determinants of Health (SDOH) Interventions ?  ? ?Readmission Risk Interventions ?   ? View : No data to display.  ?  ?  ?  ? ? ?

## 2021-12-25 NOTE — Evaluation (Signed)
Occupational Therapy Evaluation ?Patient Details ?Name: Jason Jarvis ?MRN: [id number] ?DOB: 1951-05-05 ?Today's Date: 12/25/2021 ? ? ?History of Present Illness Jerelle Virden is an 71 y.o. 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 Patient admited for sepsis.  ? ?Clinical Impression ?  ?Mr. Nachum Derossett is a 71 year old man who demonstrates normal upper body strength, ability to perform ADLs and perform functional mobility. No complaints of pain, fatigue or weakness. From a functional stanpoint patient is at his baseline and has no acute care OT needs. ?   ? ?Recommendations for follow up therapy are one component of a multi-disciplinary discharge planning process, led by the attending physician.  Recommendations may be updated based on patient status, additional functional criteria and insurance authorization.  ? ?Follow Up Recommendations ? No OT follow up  ?  ?Assistance Recommended at Discharge None  ?Patient can return home with the following   ? ?  ?Functional Status Assessment ? Patient has not had a recent decline in their functional status  ?Equipment Recommendations ? None recommended by OT  ?  ?Recommendations for Other Services   ? ? ?  ?Precautions / Restrictions Precautions ?Precautions: None  ? ?  ? ?Mobility Bed Mobility ?  ?  ?  ?  ?  ?  ?  ?General bed mobility comments: up in chair ?  ? ?Transfers ?Overall transfer level: Modified independent ?  ?  ?  ?  ?  ?  ?  ?  ?General transfer comment: ambulated in room and short distance in hall without device. No  overt loss of balance. ?  ? ?  ?Balance Overall balance assessment: No apparent balance deficits (not formally assessed) ?  ?  ?  ?  ?  ?  ?  ?  ?  ?  ?  ?  ?  ?  ?  ?  ?  ?  ?   ? ?ADL either performed or assessed with clinical judgement  ? ?ADL Overall ADL's : Independent ?  ?  ?  ?  ?  ?  ?  ?  ?  ?  ?  ?  ?  ?  ?  ?  ?  ?  ?  ?   ? ? ? ?Vision Patient Visual Report: No change from baseline ?   ?   ?Perception   ?  ?Praxis   ?  ? ?Pertinent Vitals/Pain Pain Assessment ?Pain Assessment: No/denies pain  ? ? ? ?Hand Dominance Right ?  ?Extremity/Trunk Assessment Upper Extremity Assessment ?Upper Extremity Assessment: RUE deficits/detail;LUE deficits/detail ?RUE Deficits / Details: WNL ROM, 5/5 strength ?RUE Sensation: WNL ?RUE Coordination: WNL ?LUE Deficits / Details: WNL ROM, 5/5 strength ?LUE Sensation: WNL ?LUE Coordination: WNL ?  ?Lower Extremity Assessment ?Lower Extremity Assessment: Defer to PT evaluation ?  ?Cervical / Trunk Assessment ?Cervical / Trunk Assessment: Normal ?  ?Communication Communication ?Communication: No difficulties ?  ?Cognition Arousal/Alertness: Awake/alert ?Behavior During Therapy: [facility] for tasks assessed/performed ?Overall Cognitive Status: Within Functional Limits for tasks assessed ?  ?  ?  ?  ?  ?  ?  ?  ?  ?  ?  ?  ?  ?  ?  ?  ?  ?  ?  ?  General Comments    ? ?  ?Exercises   ?  ?Shoulder Instructions    ? ? ?Home Living Family/patient expects to be discharged to:: Private residence ?Living Arrangements: Spouse/significant other ?Available Help at Discharge: Family;Available 24 hours/day ?Type of Home: House ?Home Access: Stairs to enter;Ramped entrance (and one step) ?  ?  ?Home Layout: One level ?  ?  ?Bathroom Shower/Tub: Tub/shower unit;Walk-in shower ?  ?Bathroom Toilet: Standard ?  ?  ?Home Equipment: Kasandra Knudsen - single point ?  ?  ?  ? ?  ?Prior Functioning/Environment Prior Level of Function : Independent/Modified Independent ?  ?  ?  ?  ?  ?  ?Mobility Comments: reports uses a  cane PRN outside ?  ?  ? ?  ?  ?OT Problem List:   ?  ?   ?OT Treatment/Interventions:    ?  ?OT Goals(Current goals can be found in the care plan section) Acute Rehab OT Goals ?OT Goal Formulation: All assessment and education complete, DC therapy  ?OT Frequency:   ?  ? ?Co-evaluation   ?  ?  ?  ?  ? ?  ?AM-PAC OT "6 Clicks" Daily Activity     ?Outcome Measure Help from another person eating meals?: None ?Help from another person taking care of personal grooming?: None ?Help from another person toileting, which includes using toliet, bedpan, or urinal?: None ?Help from another person bathing (including washing, rinsing, drying)?: None ?Help from another person to put on and taking off regular upper body clothing?: None ?Help from another person to put on and taking off regular lower body clothing?: None ?6 Click Score: 24 ?  ?End of Session   ? ?Activity Tolerance: Patient tolerated treatment well ?Patient left: in chair;with call bell/phone within reach;with family/visitor present ? ?OT Visit Diagnosis: Muscle weakness (generalized) (M62.81)  ?              ?Time: 2841-3244 ?OT Time Calculation (min): 8 min ?Charges:  OT General Charges ?$OT Visit: 1 Visit ?OT Evaluation ?$OT Eval Low Complexity: 1 Low ? ?Laquilla Dault, OTR/L ?Acute Care Rehab Services  ?Office [phone] ?Pager: [phone]  ? ?Sanyiah Kanzler L Malikah Lakey ?12/25/2021, 11:18 AM ?

## 2021-12-25 NOTE — Progress Notes (Signed)
[facility] ?PROGRESS NOTE ? ? ? ?Progress Note  ?Jason Jarvis  ZSW:[id number] DOB: Nov 01, 1950 DOA: 12/21/2021 ?PCP: Jeni Salles, MD  ? ? ? ?Brief Narrative:  ? ?Jason Jarvis is an 71 y.o. 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  CT of the head showed no acute findings, CT of the abdomen and pelvis showed no acute intra-abdominal or pelvic processes but did show some post therapeutic changes of his renal cell carcinoma no evidence of recurrence.  As he was febrile in the ED and with a white count of 15 there was a concern for infection he was started empirically on antibiotics, scrotal ultrasound showed microcalcifications of the right testicle without abscess fluid or signs of infection, blood cultures on 12/21/2021 returned positive for Enterococcus faecalis in the setting of foreign body pacemaker, bilateral lower extremity Doppler was negative for DVT.  Infectious disease was consulted and 2D echo showed no evidence of vegetation cardiology was consulted for TEE which will be done at the earliest next week.  Surveillance blood culture on 12/24/2021 has been negative till date ? ? ? ?Assessment/Plan:  ? ?Sepsis ([place]) due to Enterococcus faecalis bacteremia: ?Start empirically on IV meropenem now has been de-escalated to IV Rocephin and ampicillin. ?Surveillance blood cultures on 12/24/2021 have been negative till date. ?Imaging has been unremarkable chest x-ray, CT scan of the abdomen and pelvis and scrotal  ultrasound. ?Infectious disease has been consulted and due to persistent positive blood cultures.  Concern for infective endocarditis. ?Cardiology has been consulted for possible TEE early next week. ? ?Indeterminate age DVT: ?Placed on full dose of Lovenox. ? ?Acute metabolic encephalopathy likely due to sepsis: ?Now resolved. ? ?Hypovolemic hyponatremia: ?Started on IV fluid resuscitation is now resolved. ? ?Diabetes mellitus type II: ?With an A1c of 6.5, continue sliding scale insulin. ? ?CAD: ?Continue aspirin Lipitor. ? ?Scrotal pain: ?No evidence of cystitis epididymitis. ?Scrotal ultrasound was unremarkable. ? ?Acute on chronic systolic heart failure: ?Appears euvolemic on physical exam KVO IV fluids. ?Continue strict I's and O's and daily weights. ? ? ?DVT prophylaxis: lovenox ?Family Communication: Wife ?Status is: Inpatient ?Remains inpatient appropriate because: Enterococcus bacteremia needs a TEE scheduled for next week ? ? ? ?Code Status:  ? ?  ?Code Status Orders  ?(From admission, onward)  ?  ? ? ?  ? ?  Start     Ordered  ? 12/22/21 0201  Full code  Continuous       ? 12/22/21 0201  ? ?  ?  ? ?  ? ?Code Status History   ? ? This patient has a current code status but no historical code status.  ? ?  ? ? ? ? ?IV Access:  ? ?Peripheral IV ? ? ?Procedures and diagnostic studies:  ? ?ECHOCARDIOGRAM COMPLETE ? ?Result Date: 12/23/2021 ?   ECHOCARDIOGRAM REPORT   Patient Name:   Jason Jarvis Date of Exam: 12/23/2021 Medical Rec #:  [id number]     Height:       69.0 in Accession #:    [id number]    Weight:  198.7 lb Date of Birth:  [birth year]     BSA:          2.060 m? Patient Age:    71 years      BP:           150/77 mmHg Patient Gender: M             HR:           61 bpm. Exam Location:  Inpatient Procedure: 2D Echo, Cardiac Doppler, Color Doppler and Intracardiac            Opacification Agent Indications:    Sepsis  History:        Patient has no prior history of Echocardiogram examinations. CAD                  and Previous Myocardial Infarction, Pacemaker, Stroke; Risk                 Factors:Hypertension. 12/02/2017 Pacemaker                 09/28/19 cath.  Sonographer:    [facility] Referring Phys: [id number] Vanna Scotland IMPRESSIONS  1. Left ventricular ejection fraction, by estimation, is 50%. The left ventricle has mildly decreased function. The left ventricle demonstrates global hypokinesis with septal-lateral dyssynchrony due to RV pacing. There is mild left ventricular hypertrophy. Left ventricular diastolic parameters are consistent with Grade I diastolic dysfunction (impaired relaxation).  2. Right ventricular systolic function is normal. The right ventricular size is normal. There is normal pulmonary artery systolic pressure. The estimated right ventricular systolic pressure is 56.4 mmHg.  3. The mitral valve is normal in structure. No evidence of mitral valve regurgitation. No evidence of mitral stenosis.  4. The aortic valve is tricuspid. Aortic valve regurgitation is not visualized. No aortic stenosis is present.  5. The inferior vena cava is normal in size with greater than 50% respiratory variability, suggesting right atrial pressure of 3 mmHg. FINDINGS  Left Ventricle: Left ventricular ejection fraction, by estimation, is 50%. The left ventricle has mildly decreased function. The left ventricle demonstrates global hypokinesis. Definity contrast agent was given IV to delineate the left ventricular endocardial borders. The left ventricular internal cavity size was normal in size. There is mild left ventricular hypertrophy. Left ventricular diastolic parameters are consistent with Grade I diastolic dysfunction (impaired relaxation). Right Ventricle: The right ventricular size is normal. No increase in right ventricular wall thickness. Right ventricular systolic function is normal. There is normal pulmonary artery systolic pressure. The tricuspid regurgitant velocity is 2.40 m/s, and  with an assumed right atrial  pressure of 3 mmHg, the estimated right ventricular systolic pressure is 33.2 mmHg. Left Atrium: Left atrial size was normal in size. Right Atrium: Right atrial size was normal in size. Pericardium: Trivial pericardial effusion is present. Mitral Valve: The mitral valve is normal in structure. There is mild calcification of the mitral valve leaflet(s). No evidence of mitral valve regurgitation. No evidence of mitral valve stenosis. MV peak gradient, 3.5 mmHg. The mean mitral valve gradient  is 1.0 mmHg. Tricuspid Valve: The tricuspid valve is normal in structure. Tricuspid valve regurgitation is trivial. Aortic Valve: The aortic valve is tricuspid. Aortic valve regurgitation is not visualized. No aortic stenosis is present. Aortic valve mean gradient measures 3.0 mmHg. Aortic valve peak gradient measures 6.0 mmHg. Aortic valve area, by VTI measures 2.56 cm?. Pulmonic Valve: The pulmonic valve was normal in structure. Pulmonic valve regurgitation is trivial. Aorta: The aortic  root is normal in size and structure. Venous: The inferior vena cava is normal in size with greater than 50% respiratory variability, suggesting right atrial pressure of 3 mmHg. IAS/Shunts: No atrial level shunt detected by color flow Doppler. Additional Comments: A device lead is visualized in the right ventricle.  LEFT VENTRICLE PLAX 2D LVIDd:         5.40 cm   Diastology LVIDs:         3.70 cm   LV e' medial:    3.02 cm/s LV PW:         1.30 cm   LV E/e' medial:  18.7 LV IVS:        1.70 cm   LV e' lateral:   2.82 cm/s LVOT diam:     2.10 cm   LV E/e' lateral: 20.0 LV SV:         67 LV SV Index:   32 LVOT Area:     3.46 cm?  RIGHT VENTRICLE RV Basal diam:  3.60 cm RV Mid diam:    3.20 cm RV S prime:     13.90 cm/s TAPSE (M-mode): 3.0 cm LEFT ATRIUM             Index LA diam:        2.90 cm 1.41 cm/m? LA Vol (A2C):   29.2 ml 14.17 ml/m? LA Vol (A4C):   39.0 ml 18.93 ml/m? LA Biplane Vol: 37.4 ml 18.15 ml/m?  AORTIC VALVE                     PULMONIC VALVE AV Area (Vmax):    2.72 cm?     PV Vmax:          1.12 m/s AV Area (Vmean):   2.63 cm?     PV Vmean:         72.200 cm/s AV Area (VTI):     2.56 cm?     PV VTI:           0.230 m AV Vmax:           122.0

## 2021-12-25 NOTE — Evaluation (Signed)
Physical Therapy Evaluation ?Patient Details ?Name: Jason Jarvis ?MRN: [id number] ?DOB: Nov 05, 1950 ?Today's Date: 12/25/2021 ? ?History of Present Illness ? Jason Jarvis is an 71 y.o. 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 Patient admited for sepsis, bacteremia, scrotal pain, AMS.  ?Clinical Impression ? On eval, pt was Supv for mobility. He walked ~300 feet around the unit. Intermittent unsteadiness but no overt LOB. Will continue to follow pt during hospital stay. Do not anticipate any f/u PT needs at this time.    ?   ? ?Recommendations for follow up therapy are one component of a multi-disciplinary discharge planning process, led by the attending physician.  Recommendations may be updated based on patient status, additional functional criteria and insurance authorization. ? ?Follow Up Recommendations No PT follow up ? ?  ?Assistance Recommended at Discharge PRN  ?Patient can return home with the following ?   ? ?  ?Equipment Recommendations None recommended by PT  ?Recommendations for Other Services ?    ?  ?Functional Status Assessment Patient has had a recent decline in their functional status and demonstrates the ability to make significant improvements in function in a reasonable and predictable amount of time.  ? ?  ?Precautions / Restrictions Precautions ?Precautions: Fall ?Restrictions ?Weight Bearing Restrictions: No  ? ?  ? ?Mobility ? Bed Mobility ?Overal bed mobility: Modified Independent ?  ?  ?  ?  ?  ?  ?  ?  ? ?Transfers ?Overall transfer level:  Modified independent ?  ?  ?  ?  ?  ?  ?  ?  ?  ?  ? ?Ambulation/Gait ?Ambulation/Gait assistance: Supervision ?Gait Distance (Feet): 300 Feet ?Assistive device: None ?Gait Pattern/deviations: Step-through pattern ?  ?  ?  ?General Gait Details: Intermittent unsteadiness but no overt LOB. Pt tolerated distance well. ? ?Stairs ?  ?  ?  ?  ?  ? ?Wheelchair Mobility ?  ? ?Modified Rankin (Stroke Patients Only) ?  ? ?  ? ?Balance Overall balance assessment: Mild deficits observed, not formally tested ?  ?  ?  ?  ?  ?  ?  ?  ?  ?  ?  ?  ?  ?  ?  ?  ?  ?  ?   ? ? ? ?Pertinent Vitals/Pain Pain Assessment ?Pain Assessment: No/denies pain  ? ? ?Home Living Family/patient expects to be discharged to:: Private residence ?Living Arrangements: Spouse/significant other ?Available Help at Discharge: Family;Available 24 hours/day ?Type of Home: House ?Home Access: Stairs to enter;Ramped entrance ?  ?  ?  ?Home Layout: One level ?Home Equipment: Kasandra Knudsen - single point ?   ?  ?Prior Function Prior Level of Function : Independent/Modified Independent ?  ?  ?  ?  ?  ?  ?Mobility Comments: reports uses a cane PRN outside ?  ?  ? ? ?Hand Dominance  ? Dominant Hand: Right ? ?  ?Extremity/Trunk Assessment  ? Upper Extremity Assessment ?Upper Extremity Assessment: Defer to OT evaluation ?RUE Deficits / Details: WNL ROM, 5/5 strength ?RUE Sensation: WNL ?RUE Coordination: WNL ?LUE Deficits / Details: WNL ROM, 5/5 strength ?LUE  Sensation: WNL ?LUE Coordination: WNL ?  ? ?Lower Extremity Assessment ?Lower Extremity Assessment: Generalized weakness ?  ? ?Cervical / Trunk Assessment ?Cervical / Trunk Assessment: Normal  ?Communication  ? Communication: Expressive difficulties (difficult to understand at times)  ?Cognition Arousal/Alertness: Awake/alert ?Behavior During Therapy: [facility] for tasks assessed/performed ?Overall Cognitive Status: Within Functional Limits for tasks assessed ?  ?  ?  ?  ?  ?  ?  ?  ?  ?  ?  ?  ?  ?  ?  ?  ?  ?  ?  ? ?   ?General Comments   ? ?  ?Exercises    ? ?Assessment/Plan  ?  ?PT Assessment Patient needs continued PT services  ?PT Problem List Decreased mobility ? ?   ?  ?PT Treatment Interventions Gait training;Therapeutic activities;Therapeutic exercise;Patient/family education;Functional mobility training   ? ?PT Goals (Current goals can be found in the Care Plan section)  ?Acute Rehab PT Goals ?Patient Stated Goal: home soon ?PT Goal Formulation: With patient ?Time For Goal Achievement: 01/08/22 ?Potential to Achieve Goals: Good ? ?  ?Frequency Min 3X/week ?  ? ? ?Co-evaluation   ?  ?  ?  ?  ? ? ?  ?AM-PAC PT "6 Clicks" Mobility  ?Outcome Measure Help needed turning from your back to your side while in a flat bed without using bedrails?: None ?Help needed moving from lying on your back to sitting on the side of a flat bed without using bedrails?: None ?Help needed moving to and from a bed to a chair (including a wheelchair)?: None ?Help needed standing up from a chair using your arms (e.g., wheelchair or bedside chair)?: None ?Help needed to walk in hospital room?: A Little ?Help needed climbing 3-5 steps with a railing? : A Little ?6 Click Score: 22 ? ?  ?End of Session   ?Activity Tolerance: Patient tolerated treatment well ?Patient left: in bed;with call bell/phone within reach;with bed alarm set ?  ?PT Visit Diagnosis: Unsteadiness on feet (R26.81) ?  ? ?Time: 0923-3007 ?PT Time Calculation (min) (ACUTE ONLY): 17 min ? ? ?Charges:   PT Evaluation ?$PT Eval Low Complexity: 1 Low ?  ?  ?   ? ? ? ? ? ?Jason Jarvis P, PT ?Acute Rehabilitation  ?Office: [phone] ?Pager: [phone] ? ?  ? ?

## 2021-12-25 NOTE — Progress Notes (Signed)
? ?  Shared Decision Making/Informed Consent ? ?The risks [esophageal damage, perforation (1:10,000 risk), bleeding, pharyngeal hematoma as well as other potential complications associated with conscious sedation including aspiration, arrhythmia, respiratory failure and death], benefits (treatment guidance and diagnostic support) and alternatives of a transesophageal echocardiogram were discussed in detail with Jason Jarvis and he is willing to proceed.   ? ?NPO after midnight, TEE scheduled tomorrow 12/26/21 ?

## 2021-12-26 ENCOUNTER — Encounter (HOSPITAL_COMMUNITY)
Admission: EM | Disposition: A | Discharge status: Home Health | Payer: Self-pay | Source: Home / Self Care | Attending: Internal Medicine

## 2021-12-26 ENCOUNTER — Encounter (HOSPITAL_COMMUNITY): Payer: Self-pay | Admitting: General Practice

## 2021-12-26 ENCOUNTER — Inpatient Hospital Stay (HOSPITAL_COMMUNITY): Payer: Medicare HMO | Admitting: Anesthesiology

## 2021-12-26 ENCOUNTER — Inpatient Hospital Stay (HOSPITAL_COMMUNITY): Payer: Medicare HMO

## 2021-12-26 DIAGNOSIS — B952 Enterococcus as the cause of diseases classified elsewhere: Secondary | ICD-10-CM | POA: Diagnosis not present

## 2021-12-26 DIAGNOSIS — G9341 Metabolic encephalopathy: Secondary | ICD-10-CM | POA: Diagnosis not present

## 2021-12-26 DIAGNOSIS — E1122 Type 2 diabetes mellitus with diabetic chronic kidney disease: Secondary | ICD-10-CM

## 2021-12-26 DIAGNOSIS — R7881 Bacteremia: Secondary | ICD-10-CM

## 2021-12-26 DIAGNOSIS — N5082 Scrotal pain: Secondary | ICD-10-CM

## 2021-12-26 DIAGNOSIS — I3139 Other pericardial effusion (noninflammatory): Secondary | ICD-10-CM | POA: Diagnosis not present

## 2021-12-26 DIAGNOSIS — A419 Sepsis, unspecified organism: Secondary | ICD-10-CM | POA: Diagnosis not present

## 2021-12-26 DIAGNOSIS — N182 Chronic kidney disease, stage 2 (mild): Secondary | ICD-10-CM

## 2021-12-26 DIAGNOSIS — I482 Chronic atrial fibrillation, unspecified: Secondary | ICD-10-CM

## 2021-12-26 DIAGNOSIS — I129 Hypertensive chronic kidney disease with stage 1 through stage 4 chronic kidney disease, or unspecified chronic kidney disease: Secondary | ICD-10-CM

## 2021-12-26 DIAGNOSIS — I2510 Atherosclerotic heart disease of native coronary artery without angina pectoris: Secondary | ICD-10-CM

## 2021-12-26 HISTORY — PX: BUBBLE STUDY: SHX6837

## 2021-12-26 HISTORY — PX: TEE WITHOUT CARDIOVERSION: SHX5443

## 2021-12-26 LAB — CULTURE, BLOOD (ROUTINE X 2): Special Requests: ADEQUATE

## 2021-12-26 LAB — PROTIME-INR
INR: 1.1 (ref 0.8–1.2)
Prothrombin Time: 13.9 seconds (ref 11.4–15.2)

## 2021-12-26 LAB — GLUCOSE, CAPILLARY
Glucose-Capillary: 122 mg/dL — ABNORMAL HIGH (ref 70–99)
Glucose-Capillary: 110 mg/dL — ABNORMAL HIGH (ref 70–99)
Glucose-Capillary: 96 mg/dL (ref 70–99)
Glucose-Capillary: 151 mg/dL — ABNORMAL HIGH (ref 70–99)

## 2021-12-26 SURGERY — ECHOCARDIOGRAM, TRANSESOPHAGEAL
Anesthesia: Monitor Anesthesia Care

## 2021-12-26 MED ORDER — PROPOFOL 500 MG/50ML IV EMUL
INTRAVENOUS | Status: DC | PRN
  Administered 2021-12-26: 100 ug/kg/min via INTRAVENOUS

## 2021-12-26 MED ORDER — PROPOFOL 10 MG/ML IV BOLUS
INTRAVENOUS | Status: DC | PRN
  Administered 2021-12-26: 20 mg via INTRAVENOUS

## 2021-12-26 MED ORDER — PHENYLEPHRINE 80 MCG/ML (10ML) SYRINGE FOR IV PUSH (FOR BLOOD PRESSURE SUPPORT)
PREFILLED_SYRINGE | INTRAVENOUS | Status: DC | PRN
  Administered 2021-12-26 (×2): 80 ug via INTRAVENOUS

## 2021-12-26 MED ORDER — SODIUM CHLORIDE 0.9 % IV SOLN: INTRAVENOUS | Status: DC

## 2021-12-26 NOTE — Progress Notes (Signed)
?  Echocardiogram ?Echocardiogram Transesophageal has been performed. ? ?Jason Jarvis ?12/26/2021, 2:59 PM ?

## 2021-12-26 NOTE — Care Management Important Message (Signed)
Important Message ? ?Patient Details IM Letter given to the Patient. ?Name: Jason Jarvis ?MRN: [id number] ?Date of Birth: Mar 28, 1951 ? ? ?Medicare Important Message Given:  Yes ? ? ? ? ?Kerin Salen ?12/26/2021, 1:59 PM ?

## 2021-12-26 NOTE — Anesthesia Preprocedure Evaluation (Addendum)
Anesthesia Evaluation  ?Patient identified by MRN, date of birth, ID band ?Patient awake ? ? ? ?Reviewed: ?Allergy & Precautions, NPO status , Patient's Chart, lab work & pertinent test results, reviewed documented beta blocker date and time  ? ?History of Anesthesia Complications ?Negative for: history of anesthetic complications ? ?Airway ?Mallampati: II ? ?TM Distance: >3 FB ?Neck ROM: Full ? ? ? Dental ? ?(+) Edentulous Upper, Edentulous Lower ?  ?Pulmonary ?neg pulmonary ROS,  ?  ?Pulmonary exam normal ? ? ? ? ? ? ? Cardiovascular ?hypertension, Pt. on home beta blockers and Pt. on medications ?+ CAD and + Past MI  ?Normal cardiovascular exam+ pacemaker  ? ? ?'23 TTE - EF 50%. Global hypokinesis with septal-lateral dyssynchrony due to RV pacing. There is mild left ventricular hypertrophy. Grade I diastolic dysfunction (impaired relaxation). No valvulopathy. ? ? ?  ?Neuro/Psych ?Seizures - (off meds), Well Controlled,  CVA, Residual Symptoms negative psych ROS  ? GI/Hepatic ?negative GI ROS, Neg liver ROS,   ?Endo/Other  ?diabetes, Insulin Dependent ? Renal/GU ?CRFRenal disease ?Hx RCC ?  ? ?  ?Musculoskeletal ?negative musculoskeletal ROS ?(+)  ? Abdominal ?  ?Peds ? Hematology ? ?(+) Blood dyscrasia, anemia ,   ?Anesthesia Other Findings ? ? Reproductive/Obstetrics ? ?  ? ? ? ? ? ? ? ? ? ? ? ? ? ?  ?  ? ? ? ? ? ? ? ?Anesthesia Physical ?Anesthesia Plan ? ?ASA: 3 ? ?Anesthesia Plan: MAC  ? ?Post-op Pain Management:   ? ?Induction:  ? ?PONV Risk Score and Plan: 1 and Propofol infusion and Treatment may vary due to age or medical condition ? ?Airway Management Planned: Nasal Cannula and Natural Airway ? ?Additional Equipment: None ? ?Intra-op Plan:  ? ?Post-operative Plan:  ? ?Informed Consent: I have reviewed the patients History and Physical, chart, labs and discussed the procedure including the risks, benefits and alternatives for the proposed anesthesia with the patient or  authorized representative who has indicated his/her understanding and acceptance.  ? ? ? ? ? ?Plan Discussed with: CRNA and Anesthesiologist ? ?Anesthesia Plan Comments:   ? ? ? ? ? ?Anesthesia Quick Evaluation ? ?

## 2021-12-26 NOTE — H&P (View-Only) (Signed)
[facility] ?PROGRESS NOTE ? ? ? ?Progress Note  ?Jason Jarvis  HER:[id number] DOB: 1951-09-08 DOA: 12/21/2021 ?PCP: Jeni Salles, MD  ? ? ? ?Brief Narrative:  ? ?Jason Jarvis is an 71 y.o. 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  CT of the head showed no acute findings, CT of the abdomen and pelvis showed no acute intra-abdominal or pelvic processes but did show some post therapeutic changes of his renal cell carcinoma no evidence of recurrence.  As he was febrile in the ED and with a white count of 15 there was a concern for infection he was started empirically on antibiotics, scrotal ultrasound showed microcalcifications of the right testicle without abscess fluid or signs of infection, blood cultures on 12/21/2021 returned positive for Enterococcus faecalis in the setting of foreign body pacemaker, bilateral lower extremity Doppler was negative for DVT.  Infectious disease was consulted and 2D echo showed no evidence of vegetation cardiology was consulted for TEE on 12/26/2021 surveillance blood culture on 12/24/2021 has been negative till date ? ? ? ?Assessment/Plan:  ? ?Sepsis ([place]) due to Enterococcus faecalis bacteremia: ?Continue IV Rocephin and ampicillin. ?Surveillance blood cultures on 12/24/2021 have been negative till date. ?Infectious disease has been consulted and due to persistent positive blood cultures.  Concern for infective endocarditis. ?Cardiology has been consulted 2D echo on 12/26/2021. ? ?Indeterminate  age DVT: ?Placed on full dose of Lovenox. ?After TEE can transition to a DOAC ? ?Acute metabolic encephalopathy likely due to sepsis: ?Now resolved. ? ?Hypovolemic hyponatremia: ?Resolved with fluid resuscitation. ? ?Diabetes mellitus type II: ?With an A1c of 6.5, continue sliding scale insulin. ? ?CAD: ?Continue aspirin Lipitor. ? ?Scrotal pain: ?No evidence of cystitis epididymitis. ?Scrotal ultrasound was unremarkable. ? ?Acute on chronic systolic heart failure: ?Appears euvolemic on physical exam KVO IV fluids. ?Continue strict I's and O's and daily weights. ? ? ?DVT prophylaxis: lovenox ?Family Communication: Wife ?Status is: Inpatient ?Remains inpatient appropriate because: Enterococcus bacteremia needs a TEE scheduled for next week ? ? ? ?Code Status:  ? ?  ?Code Status Orders  ?(From admission, onward)  ?  ? ? ?  ? ?  Start     Ordered  ? 12/22/21 0201  Full code  Continuous       ? 12/22/21 0201  ? ?  ?  ? ?  ? ?Code Status History   ? ? This patient has a current code status but no historical code status.  ? ?  ? ? ? ? ?IV Access:  ? ?Peripheral IV ? ? ?Procedures and diagnostic studies:  ? ?No results found. ? ? ?Medical Consultants:  ? ?None. ? ? ?Subjective:  ? ? ?Jason Jarvis no complaints ready to get this over with ? ?Objective:  ? ? ?Vitals:  ? 12/25/21 8563 12/25/21 0908 12/25/21 1945 12/26/21 0445  ?BP: (!) 177/73 (!) 171/71 (!) 151/80 (!) 149/80  ?Pulse: (!) 59 62 61 63  ?Resp: '18 17 20 20  '$ ?Temp: 98.4 ?F (36.9 ?C) 98.1 ?F (36.7 ?C) 98.7 ?F (37.1 ?C) 98.8 ?F (37.1 ?C)  ?TempSrc: Oral Oral  Oral Oral  ?SpO2: 96% 97% 98% 97%  ?Weight:      ?Height:      ? ?SpO2: 97 % ? ? ?Intake/Output Summary (Last 24 hours) at 12/26/2021 0746 ?Last data filed at 12/26/2021 0600 ?Gross per 24 hour  ?Intake 2000 ml  ?Output 2900 ml  ?Net -900 ml  ? ? ?Filed Weights  ? 12/22/21 0149 12/23/21 0429 12/24/21 0500  ?Weight: 91 kg 90.1 kg 88 kg  ? ? ?Exam: ?General exam: In no acute distress. ?Respiratory system: Good air  movement and clear to auscultation. ?Cardiovascular system: S1 & S2 heard, RRR. No JVD. ?Gastrointestinal system: Abdomen is nondistended, soft and nontender.  ?Extremities: No pedal edema. ?Skin: No rashes, lesions or ulcers ?Psychiatry: Judgement and insight appear normal. Mood & affect appropriate. ? ? ?Data Reviewed:  ? ? ?Labs: ?Basic Metabolic Panel: ?Recent Labs  ?Lab 12/21/21 ?2033 12/22/21 ?0346 12/25/21 ?0407  ?NA 132* 134* 138  ?K 3.8 3.7 4.6  ?CL 99 103 107  ?CO2 '25 25 24  '$ ?GLUCOSE 153* 161* 118*  ?BUN '20 20 21  '$ ?CREATININE 1.42* 1.19 1.09  ?CALCIUM 8.3* 8.1* 8.8*  ?MG  --  1.9 2.1  ?PHOS  --  3.0 4.1  ? ? ?GFR ?Estimated Creatinine Clearance: 66.2 mL/min (by C-G formula based on SCr of 1.09 mg/dL). ?Liver Function Tests: ?Recent Labs  ?Lab 12/21/21 ?2033 12/22/21 ?0346  ?AST 19 15  ?ALT 20 30  ?ALKPHOS 86 73  ?BILITOT 1.2 1.6*  ?PROT 6.5 5.7*  ?ALBUMIN 3.5 3.0*  ? ? ?No results for input(s): LIPASE, AMYLASE in the last 168 hours. ?Recent Labs  ?Lab 12/22/21 ?1419  ?AMMONIA 16  ? ? ?Coagulation profile ?Recent Labs  ?Lab 12/23/21 ?2725 12/24/21 ?0332 12/25/21 ?0407 12/26/21 ?3664  ?INR 1.2 1.1 1.0 1.1  ? ? ?COVID-19 Labs ? ?No results for input(s): DDIMER, FERRITIN, LDH, CRP in the last 72 hours. ? ? ?Lab Results  ?Component Value Date  ? [place] NEGATIVE 12/21/2021  ? ? ?CBC: ?Recent Labs  ?Lab 12/21/21 ?2033 12/22/21 ?0346 12/22/21 ?0806 12/25/21 ?0407  ?WBC 15.1* 15.4* 15.0* 5.9  ?HGB 12.3* 11.1* 11.1* 12.0*  ?HCT 37.9* 35.1* 34.7* 38.0*  ?MCV 82.8 84.2 84.4 84.4  ?PLT 157 129* 138* 160  ? ? ?Cardiac Enzymes: ?No results for input(s): CKTOTAL, CKMB, CKMBINDEX, TROPONINI in the last 168 hours. ?BNP (last 3 results) ?No results for input(s): PROBNP in the last 8760 hours. ?CBG: ?Recent Labs  ?Lab 12/25/21 ?0752 12/25/21 ?1055 12/25/21 ?1615 12/25/21 ?1821 12/25/21 ?1946  ?GLUCAP 120* 179* 122* 195* 192*  ? ? ?D-Dimer: ?No results for input(s): DDIMER in the last 72 hours. ? ?Hgb A1c: ?No results for  input(s): HGBA1C in the last 72 hours. ?Lipid Profile: ?No results for input(s): CHOL, HDL, LDLCALC, TRIG, CHOLHDL, LDLDIRECT in the last 72 hours. ?Thyroid function studies: ?No results for input(s): TSH, T4TOTAL, T3FREE, THYROIDAB in the last 72 hours. ? ?Invalid input(s): FREET3 ?Anemia work up: ?No results for input(s): VITAMINB12, FOLATE, FERRITIN, TIBC, IRON, RETICCTPCT in the last 72 hours. ?Sepsis Labs: ?Recent Labs  ?Lab 12/21/21 ?2033 12/22/21 ?0346 12/22/21 ?0806 12/25/21 ?0407  ?WBC 15.1* 15.4* 15.0* 5.9  ?LATICACIDVEN 1.2  --   --   --   ? ? ?Microbiology ?Recent Results (from the past 240 hour(s))  ?Blood culture (routine x 2)     Status: Abnormal  ? Collection Time: 12/21/21  8:33 PM  ? Specimen: BLOOD  ?Result Value Ref Range Status  ?  Specimen Description   Final  ?  BLOOD RIGHT ANTECUBITAL ?Performed at [facility], [address], [place], [place] [address] ?  ? Special Requests   Final  ?  BOTTLES DRAWN AEROBIC AND ANAEROBIC Blood Culture adequate volume ?Performed at [facility], [address] [place], [place] [address] ?  ? Culture  Setup Time   Final  ?  GRAM POSITIVE COCCI ?IN BOTH AEROBIC AND ANAEROBIC BOTTLES ?CRITICAL RESULT CALLED TO, READ BACK BY AND VERIFIED WITH: [place] [id number] FCP ?Performed at [facility], [place] [address] [place], [place] [address] ?  ? Culture ENTEROCOCCUS FAECALIS (A)  Final  ? Report Status 12/24/2021 FINAL  Final  ? Organism ID, Bacteria ENTEROCOCCUS FAECALIS  Final  ?    Susceptibility  ? Enterococcus faecalis - MIC*  ?  AMPICILLIN <=2 SENSITIVE Sensitive   ?  VANCOMYCIN 1 SENSITIVE Sensitive   ?  GENTAMICIN SYNERGY SENSITIVE Sensitive   ?  * ENTEROCOCCUS FAECALIS  ?Resp Panel by RT-PCR (Flu A&B, Covid) Nasopharyngeal Swab     Status: None  ? Collection Time: 12/21/21  8:33 PM  ? Specimen: Nasopharyngeal Swab; Nasopharyngeal(NP) swabs in vial transport medium  ?Result Value Ref Range Status  ? SARS Coronavirus 2  by RT PCR NEGATIVE NEGATIVE Final  ?  Comment: (NOTE) ?SARS-CoV-2 target nucleic acids are NOT DETECTED. ? ?The SARS-CoV-2 RNA is generally detectable in upper respiratory ?specimens during the acute p

## 2021-12-26 NOTE — Interval H&P Note (Signed)
History and Physical Interval Note: ? ?12/26/2021 ?1:50 PM ? ?Jason Jarvis  has presented today for surgery, with the diagnosis of BACTEREMIA.  The various methods of treatment have been discussed with the patient and family. After consideration of risks, benefits and other options for treatment, the patient has consented to  Procedure(s): ?TRANSESOPHAGEAL ECHOCARDIOGRAM (TEE) (N/A) as a surgical intervention.  The patient's history has been reviewed, patient examined, no change in status, stable for surgery.  I have reviewed the patient's chart and labs.  Questions were answered to the patient's satisfaction.   ? ? ?[name] ? ? ?

## 2021-12-26 NOTE — Transfer of Care (Signed)
Immediate Anesthesia Transfer of Care Note ? ?Patient: Jason Jarvis ? ?Procedure(s) Performed: TRANSESOPHAGEAL ECHOCARDIOGRAM (TEE) ?BUBBLE STUDY ? ?Patient Location: PACU ? ?Anesthesia Type:MAC ? ?Level of Consciousness: awake and drowsy ? ?Airway & Oxygen Therapy: Patient Spontanous Breathing and Patient connected to nasal cannula oxygen ? ?Post-op Assessment: Report given to RN and Post -op Vital signs reviewed and stable ? ?Post vital signs: Reviewed and stable ? ?Last Vitals:  ?Vitals Value Taken Time  ?BP 133/62 12/26/21 1452  ?Temp    ?Pulse 61 12/26/21 1452  ?Resp 21 12/26/21 1452  ?SpO2 96 % 12/26/21 1452  ? ? ?Last Pain:  ?Vitals:  ? 12/26/21 1240  ?TempSrc: Temporal  ?PainSc:   ?   ? ?  ? ?Complications: No notable events documented. ?

## 2021-12-26 NOTE — Progress Notes (Signed)
[facility] ?PROGRESS NOTE ? ? ? ?Progress Note  ?Jason Jarvis  TXM:[id number] DOB: 1950/12/11 DOA: 12/21/2021 ?PCP: Jeni Salles, MD  ? ? ? ?Brief Narrative:  ? ?Jason Jarvis is an 71 y.o. male past medical history significant for COPD, coronary artery disease, remote history of seizures off Keppra for the last 8 years, nephrolithiasis requiring lithotripsy, chronic kidney cyst stage II, paroxysmal atrial fibrillation not on anticoagulation, chronic diastolic heart failure, status post cardiac pacemaker, with renal cell carcinoma status post renal crypto ablation on 12/06/2015, left total knee replacement 2019 presents to [place] due to altered mental status scrotal, flank pain and fever.  CT of the head showed no acute findings, CT of the abdomen and pelvis showed no acute intra-abdominal or pelvic processes but did show some post therapeutic changes of his renal cell carcinoma no evidence of recurrence.  As he was febrile in the ED and with a white count of 15 there was a concern for infection he was started empirically on antibiotics, scrotal ultrasound showed microcalcifications of the right testicle without abscess fluid or signs of infection, blood cultures on 12/21/2021 returned positive for Enterococcus faecalis in the setting of foreign body pacemaker, bilateral lower extremity Doppler was negative for DVT.  Infectious disease was consulted and 2D echo showed no evidence of vegetation cardiology was consulted for TEE on 12/26/2021 surveillance blood culture on 12/24/2021 has been negative till date ? ? ? ?Assessment/Plan:  ? ?Sepsis ([place]) due to Enterococcus faecalis bacteremia: ?Continue IV Rocephin and ampicillin. ?Surveillance blood cultures on 12/24/2021 have been negative till date. ?Infectious disease has been consulted and due to persistent positive blood cultures.  Concern for infective endocarditis. ?Cardiology has been consulted 2D echo on 12/26/2021. ? ?Indeterminate  age DVT: ?Placed on full dose of Lovenox. ?After TEE can transition to a DOAC ? ?Acute metabolic encephalopathy likely due to sepsis: ?Now resolved. ? ?Hypovolemic hyponatremia: ?Resolved with fluid resuscitation. ? ?Diabetes mellitus type II: ?With an A1c of 6.5, continue sliding scale insulin. ? ?CAD: ?Continue aspirin Lipitor. ? ?Scrotal pain: ?No evidence of cystitis epididymitis. ?Scrotal ultrasound was unremarkable. ? ?Acute on chronic systolic heart failure: ?Appears euvolemic on physical exam KVO IV fluids. ?Continue strict I's and O's and daily weights. ? ? ?DVT prophylaxis: lovenox ?Family Communication: Wife ?Status is: Inpatient ?Remains inpatient appropriate because: Enterococcus bacteremia needs a TEE scheduled for next week ? ? ? ?Code Status:  ? ?  ?Code Status Orders  ?(From admission, onward)  ?  ? ? ?  ? ?  Start     Ordered  ? 12/22/21 0201  Full code  Continuous       ? 12/22/21 0201  ? ?  ?  ? ?  ? ?Code Status History   ? ? This patient has a current code status but no historical code status.  ? ?  ? ? ? ? ?IV Access:  ? ?Peripheral IV ? ? ?Procedures and diagnostic studies:  ? ?No results found. ? ? ?Medical Consultants:  ? ?None. ? ? ?Subjective:  ? ? ?Jason Jarvis no complaints ready to get this over with ? ?Objective:  ? ? ?Vitals:  ? 12/25/21 4825 12/25/21 0908 12/25/21 1945 12/26/21 0445  ?BP: (!) 177/73 (!) 171/71 (!) 151/80 (!) 149/80  ?Pulse: (!) 59 62 61 63  ?Resp: '18 17 20 20  '$ ?Temp: 98.4 ?F (36.9 ?C) 98.1 ?F (36.7 ?C) 98.7 ?F (37.1 ?C) 98.8 ?F (37.1 ?C)  ?TempSrc: Oral Oral  Oral Oral  ?SpO2: 96% 97% 98% 97%  ?Weight:      ?Height:      ? ?SpO2: 97 % ? ? ?Intake/Output Summary (Last 24 hours) at 12/26/2021 0746 ?Last data filed at 12/26/2021 0600 ?Gross per 24 hour  ?Intake 2000 ml  ?Output 2900 ml  ?Net -900 ml  ? ? ?Filed Weights  ? 12/22/21 0149 12/23/21 0429 12/24/21 0500  ?Weight: 91 kg 90.1 kg 88 kg  ? ? ?Exam: ?General exam: In no acute distress. ?Respiratory system: Good air  movement and clear to auscultation. ?Cardiovascular system: S1 & S2 heard, RRR. No JVD. ?Gastrointestinal system: Abdomen is nondistended, soft and nontender.  ?Extremities: No pedal edema. ?Skin: No rashes, lesions or ulcers ?Psychiatry: Judgement and insight appear normal. Mood & affect appropriate. ? ? ?Data Reviewed:  ? ? ?Labs: ?Basic Metabolic Panel: ?Recent Labs  ?Lab 12/21/21 ?2033 12/22/21 ?0346 12/25/21 ?0407  ?NA 132* 134* 138  ?K 3.8 3.7 4.6  ?CL 99 103 107  ?CO2 '25 25 24  '$ ?GLUCOSE 153* 161* 118*  ?BUN '20 20 21  '$ ?CREATININE 1.42* 1.19 1.09  ?CALCIUM 8.3* 8.1* 8.8*  ?MG  --  1.9 2.1  ?PHOS  --  3.0 4.1  ? ? ?GFR ?Estimated Creatinine Clearance: 66.2 mL/min (by C-G formula based on SCr of 1.09 mg/dL). ?Liver Function Tests: ?Recent Labs  ?Lab 12/21/21 ?2033 12/22/21 ?0346  ?AST 19 15  ?ALT 20 30  ?ALKPHOS 86 73  ?BILITOT 1.2 1.6*  ?PROT 6.5 5.7*  ?ALBUMIN 3.5 3.0*  ? ? ?No results for input(s): LIPASE, AMYLASE in the last 168 hours. ?Recent Labs  ?Lab 12/22/21 ?1419  ?AMMONIA 16  ? ? ?Coagulation profile ?Recent Labs  ?Lab 12/23/21 ?9323 12/24/21 ?0332 12/25/21 ?0407 12/26/21 ?5573  ?INR 1.2 1.1 1.0 1.1  ? ? ?COVID-19 Labs ? ?No results for input(s): DDIMER, FERRITIN, LDH, CRP in the last 72 hours. ? ? ?Lab Results  ?Component Value Date  ? [place] NEGATIVE 12/21/2021  ? ? ?CBC: ?Recent Labs  ?Lab 12/21/21 ?2033 12/22/21 ?0346 12/22/21 ?0806 12/25/21 ?0407  ?WBC 15.1* 15.4* 15.0* 5.9  ?HGB 12.3* 11.1* 11.1* 12.0*  ?HCT 37.9* 35.1* 34.7* 38.0*  ?MCV 82.8 84.2 84.4 84.4  ?PLT 157 129* 138* 160  ? ? ?Cardiac Enzymes: ?No results for input(s): CKTOTAL, CKMB, CKMBINDEX, TROPONINI in the last 168 hours. ?BNP (last 3 results) ?No results for input(s): PROBNP in the last 8760 hours. ?CBG: ?Recent Labs  ?Lab 12/25/21 ?0752 12/25/21 ?1055 12/25/21 ?1615 12/25/21 ?1821 12/25/21 ?1946  ?GLUCAP 120* 179* 122* 195* 192*  ? ? ?D-Dimer: ?No results for input(s): DDIMER in the last 72 hours. ? ?Hgb A1c: ?No results for  input(s): HGBA1C in the last 72 hours. ?Lipid Profile: ?No results for input(s): CHOL, HDL, LDLCALC, TRIG, CHOLHDL, LDLDIRECT in the last 72 hours. ?Thyroid function studies: ?No results for input(s): TSH, T4TOTAL, T3FREE, THYROIDAB in the last 72 hours. ? ?Invalid input(s): FREET3 ?Anemia work up: ?No results for input(s): VITAMINB12, FOLATE, FERRITIN, TIBC, IRON, RETICCTPCT in the last 72 hours. ?Sepsis Labs: ?Recent Labs  ?Lab 12/21/21 ?2033 12/22/21 ?0346 12/22/21 ?0806 12/25/21 ?0407  ?WBC 15.1* 15.4* 15.0* 5.9  ?LATICACIDVEN 1.2  --   --   --   ? ? ?Microbiology ?Recent Results (from the past 240 hour(s))  ?Blood culture (routine x 2)     Status: Abnormal  ? Collection Time: 12/21/21  8:33 PM  ? Specimen: BLOOD  ?Result Value Ref Range Status  ?  Specimen Description   Final  ?  BLOOD RIGHT ANTECUBITAL ?Performed at [facility], [address] [place], [place] [address] ?  ? Special Requests   Final  ?  BOTTLES DRAWN AEROBIC AND ANAEROBIC Blood Culture adequate volume ?Performed at [facility], [address], [place], [place] [id number] ?  ? Culture  Setup Time   Final  ?  GRAM POSITIVE COCCI ?IN BOTH AEROBIC AND ANAEROBIC BOTTLES ?CRITICAL RESULT CALLED TO, READ BACK BY AND VERIFIED WITH: [place] [id number] FCP ?Performed at [facility], [place] [address], [place], [place] [address] ?  ? Culture ENTEROCOCCUS FAECALIS (A)  Final  ? Report Status 12/24/2021 FINAL  Final  ? Organism ID, Bacteria ENTEROCOCCUS FAECALIS  Final  ?    Susceptibility  ? Enterococcus faecalis - MIC*  ?  AMPICILLIN <=2 SENSITIVE Sensitive   ?  VANCOMYCIN 1 SENSITIVE Sensitive   ?  GENTAMICIN SYNERGY SENSITIVE Sensitive   ?  * ENTEROCOCCUS FAECALIS  ?Resp Panel by RT-PCR (Flu A&B, Covid) Nasopharyngeal Swab     Status: None  ? Collection Time: 12/21/21  8:33 PM  ? Specimen: Nasopharyngeal Swab; Nasopharyngeal(NP) swabs in vial transport medium  ?Result Value Ref Range Status  ? SARS Coronavirus 2  by RT PCR NEGATIVE NEGATIVE Final  ?  Comment: (NOTE) ?SARS-CoV-2 target nucleic acids are NOT DETECTED. ? ?The SARS-CoV-2 RNA is generally detectable in upper respiratory ?specimens during the acute p

## 2021-12-26 NOTE — CV Procedure (Addendum)
INDICATIONS: ?Bacteremia ? ?PROCEDURE:  ? ?Informed consent was obtained prior to the procedure. The risks, benefits and alternatives for the procedure were discussed and the patient comprehended these risks.  Risks include, but are not limited to, cough, sore throat, vomiting, nausea, somnolence, esophageal and stomach trauma or perforation, bleeding, low blood pressure, aspiration, pneumonia, infection, trauma to the teeth and death.   ? ?After a procedural time-out, the oropharynx was anesthetized with 20% benzocaine spray.  ? ?During this procedure the patient was administered propofol per anesthesia.  The patient's heart rate, blood pressure, and oxygen saturation were monitored continuously during the procedure. The period of conscious sedation was 40 minutes, of which I was present face-to-face 100% of this time. ? ?The transesophageal probe was inserted in the esophagus and stomach without difficulty and multiple views were obtained.  The patient was kept under observation until the patient left the procedure room.  The patient left the procedure room in stable condition.  ? ?Agitated microbubble saline contrast was administered. ? ?COMPLICATIONS:   ? ?There were no immediate complications. ? ?FINDINGS:  ? ?FORMAL ECHOCARDIOGRAM REPORT PENDING ?Normal LV function ?Dual chamber pacemaker seen. Possible tiny vegetation on RA lead. At the distal aspect of the RV lead, there is an independently mobile, heterogeneous echo density that may be partially calcified. 1 x 0.7 cm. In the setting of bacteremia, suspect pacemaker lead vegetation. ?No valvular vegetations. ?No PFO. ? ?RECOMMENDATIONS:   ? ?Stable for transfer to [facility] when alert. ? ?Time Spent Directly with the Patient: ? ?60 minutes  ? ?[name] ?12/26/2021, 3:04 PM ? ?

## 2021-12-27 ENCOUNTER — Inpatient Hospital Stay: Payer: Self-pay

## 2021-12-27 DIAGNOSIS — I5023 Acute on chronic systolic (congestive) heart failure: Secondary | ICD-10-CM | POA: Diagnosis not present

## 2021-12-27 DIAGNOSIS — Z950 Presence of cardiac pacemaker: Secondary | ICD-10-CM | POA: Diagnosis not present

## 2021-12-27 DIAGNOSIS — B952 Enterococcus as the cause of diseases classified elsewhere: Secondary | ICD-10-CM | POA: Diagnosis not present

## 2021-12-27 DIAGNOSIS — T827XXD Infection and inflammatory reaction due to other cardiac and vascular devices, implants and grafts, subsequent encounter: Secondary | ICD-10-CM | POA: Diagnosis not present

## 2021-12-27 DIAGNOSIS — I482 Chronic atrial fibrillation, unspecified: Secondary | ICD-10-CM | POA: Diagnosis not present

## 2021-12-27 DIAGNOSIS — G9341 Metabolic encephalopathy: Secondary | ICD-10-CM | POA: Diagnosis not present

## 2021-12-27 DIAGNOSIS — A419 Sepsis, unspecified organism: Secondary | ICD-10-CM | POA: Diagnosis not present

## 2021-12-27 DIAGNOSIS — T827XXA Infection and inflammatory reaction due to other cardiac and vascular devices, implants and grafts, initial encounter: Secondary | ICD-10-CM

## 2021-12-27 LAB — CULTURE, BLOOD (ROUTINE X 2)
Culture: NO GROWTH
Special Requests: ADEQUATE

## 2021-12-27 LAB — GLUCOSE, CAPILLARY
Glucose-Capillary: 121 mg/dL — ABNORMAL HIGH (ref 70–99)
Glucose-Capillary: 186 mg/dL — ABNORMAL HIGH (ref 70–99)
Glucose-Capillary: 132 mg/dL — ABNORMAL HIGH (ref 70–99)
Glucose-Capillary: 134 mg/dL — ABNORMAL HIGH (ref 70–99)

## 2021-12-27 LAB — PROTIME-INR
INR: 1.1 (ref 0.8–1.2)
Prothrombin Time: 13.8 seconds (ref 11.4–15.2)

## 2021-12-27 MED ORDER — POTASSIUM CHLORIDE 20 MEQ PO PACK
20.0000 meq | PACK | ORAL | Status: DC
Start: 2021-12-27 — End: 2021-12-27

## 2021-12-27 MED ORDER — POTASSIUM CHLORIDE 20 MEQ/15ML (10%) PO SOLN
10.0000 meq | Freq: Every day | ORAL | Status: DC
  Administered 2021-12-27 – 2021-12-28 (×2): 10 meq via ORAL
  Filled 2021-12-27: qty 7.5
  Filled 2021-12-27 (×2): qty 15

## 2021-12-27 MED ORDER — POTASSIUM CHLORIDE ER 10 MEQ PO TBCR
10.0000 meq | EXTENDED_RELEASE_TABLET | Freq: Every day | ORAL | Status: DC
  Filled 2021-12-27 (×2): qty 1

## 2021-12-27 NOTE — Progress Notes (Signed)
PHARMACY CONSULT NOTE FOR: ? ?OUTPATIENT  PARENTERAL ANTIBIOTIC THERAPY (OPAT) ? ?Indication: Enterococcal pacemaker infection   ?Regimen: Ampicillin 12 gm every 24 hours as a continuous infusion + Ceftriaxone 2 gm IV Q 12 hours  ?End date: 02/05/22  ? ?Please update OPAT orders if pacemaker extracted.  ? ?IV antibiotic discharge orders are pended. ?To discharging provider:  please sign these orders via discharge navigator,  ?Select New Orders & click on the button choice - Manage This Unsigned Work.  ?  ? ?Thank you for allowing pharmacy to be a part of this patient's care. ? ?Jimmy Footman, PharmD, BCPS, BCIDP ?Infectious Diseases Clinical Pharmacist ?Phone: [phone] ?12/27/2021, 10:24 AM ? ?

## 2021-12-27 NOTE — Progress Notes (Signed)
?   ? ? ? ? ?[facility] Infect[facility] Disease ? ?Date of Admission:  12/21/2021    ? ?Lines: ?Peripheral iv's ? ?Abx: ?4/18-c ceftriaxone ?4/16-c ampicillin ? ?4/15-16 meropenem ? ?ASSESSMENT: ?71 year old male hx mrsa bacteremia with pacer infection s/p removal and appropriate abx course in 2018, reimplanted pacer 11/2017, hx renal cell cancer s/p cryoablation 2017, hx left knee replacement 2019, afib not on anticoagulation, admitted 4/15 with sepsis/ams found to have e faecalis bacteremia ? ?#E faecalis bacteremia secondary to unclear source ?#left TKA in 2019 -- doesn't appeared symptomatic ?#Hx of MRSA bacteremia and PPM infection SP explantation a nd re-implantation ?Has doppler evaluation left LE that showed age-indeterminate dvt left posterior tibial vein  ?Ct left foot (foot pain) showed no acute osseous abnormality or abscess ?4/15 bcx e faecalis 4 of 4 bottles ?4/16 bxc e faecalis ?4/17 tte negative ?4/19 repeat bcx in progress ?4/20 tee showed lead vegetation  ? ?Patient tolerating double beta-lactam well ?There is a symmetric bilateral mid-upper back rash the is more itchy when he lays in bed (first noted 4/21). Will monitor to see if need to consider abx allergy. Not clear at this time. Would check lft and cbc differential next few days ? ?It appears cardiology team (consulted 4/21) wants to transfer patient to [facility] for better evaluation about pacemaker infection management ? ? ?PLAN: ?Continue ampicillin and ceftriaxone ?As previous bcx took 3 days to grow, would wait until 4/22 to place picc for the 4/19 bcx to be more finalized ?Monitor rash ?Please get daily lft and differential along with cbc/chemistry ?Appreciate cardiology input ?Discussed with primary team ? ? ?Principal Problem: ?  Sepsis ([place]) ?Active Problems: ?  Atrial fibrillation, chronic ([place]) ?  Scrotal pain ?  Coronary artery disease ?  Type 2 diabetes mellitus ([place]) ?  Hyponatremia ?  Acute metabolic encephalopathy ?   Altered mental state ?  S/P placement of cardiac pacemaker ?  Enterococcus faecalis infection ? ? ?Allergies  ?Allergen Reactions  ? Cephalexin Hives  ? Lorazepam Other (See Comments)  ?  Hallucinated / Confused / Combative  ? Ace Inhibitors Cough  ?   ?  ? Prednisone Rash  ? Sulfa Antibiotics Rash  ? ? ?Scheduled Meds: ? amLODipine  2.5 mg Oral Daily  ? aspirin EC  81 mg Oral Daily  ? atorvastatin  40 mg Oral Daily  ? doxazosin  2 mg Oral QHS  ? enoxaparin (LOVENOX) injection  1 mg/kg Subcutaneous Q12H  ? furosemide  40 mg Oral Daily  ? insulin aspart  0-5 Units Subcutaneous QHS  ? insulin aspart  0-9 Units Subcutaneous TID WC  ? losartan  50 mg Oral Daily  ? metoprolol succinate  50 mg Oral Daily  ? pantoprazole  40 mg Oral BID  ? potassium chloride  10 mEq Oral Daily  ? senna-docusate  2 tablet Oral Daily  ? ?Continuous Infusions: ? sodium chloride Stopped (12/27/21 0550)  ? ampicillin (OMNIPEN) IV 2 g (12/27/21 0829)  ? cefTRIAXone (ROCEPHIN)  IV Stopped (12/27/21 0413)  ? ?PRN Meds:.sodium chloride, acetaminophen, melatonin, nitroGLYCERIN, ondansetron (ZOFRAN) IV, oxyCODONE, polyethylene glycol, traMADol ? ? ?SUBJECTIVE: ?Doing well a few (<3) loose stools a day ?Appetite intact ?No n/v or f/c ?Tee showed lead vegetation ? ?New rash on back, itchy when laying down only ? ?Review of Systems: ?ROS ?All other ROS was negative, except mentioned above ? ? ? ? ?OBJECTIVE: ?Vitals:  ? 12/26/21 2024 12/26/21 2207 12/27/21 0433 12/27/21  0500  ?BP: (!) 142/87 137/67 (!) 144/72   ?Pulse: 61  60   ?Resp: '20 15 20   '$ ?Temp: 97.8 ?F (36.6 ?C)  98.5 ?F (36.9 ?C)   ?TempSrc: Oral  Oral   ?SpO2: 100%  98%   ?Weight:    88.1 kg  ?Height:      ? ?Body mass index is 28.68 kg/m?. ? ?Physical Exam ?General/constitutional: no distress, pleasant ?HEENT: Normocephalic, PER, Conj Clear, EOMI, Oropharynx clear ?Neck supple ?CV: rrr no mrg ?Lungs: clear to auscultation, normal respiratory effort ?Abd: Soft, Nontender ?Ext: no  edema ? ?Skin: erythematous macular rash on back bilateral thoracic area symmetric; nontender ? ?Neuro: nonfocal ? ?MSK: left knee (tka) no swelling/tenderness  ? ? ? ?Lab Results ?Lab Results  ?Component Value Date  ? WBC 5.9 12/25/2021  ? HGB 12.0 (L) 12/25/2021  ? HCT 38.0 (L) 12/25/2021  ? MCV 84.4 12/25/2021  ? PLT 160 12/25/2021  ?  ?Lab Results  ?Component Value Date  ? CREATININE 1.09 12/25/2021  ? BUN 21 12/25/2021  ? NA 138 12/25/2021  ? K 4.6 12/25/2021  ? CL 107 12/25/2021  ? CO2 24 12/25/2021  ?  ?Lab Results  ?Component Value Date  ? ALT 30 12/22/2021  ? AST 15 12/22/2021  ? ALKPHOS 73 12/22/2021  ? BILITOT 1.6 (H) 12/22/2021  ?  ? ? ?Microbiology: ?Recent Results (from the past 240 hour(s))  ?Blood culture (routine x 2)     Status: Abnormal  ? Collection Time: 12/21/21  8:33 PM  ? Specimen: BLOOD  ?Result Value Ref Range Status  ? Specimen Description   Final  ?  BLOOD RIGHT ANTECUBITAL ?Performed at [facility], [address] [place], [place] [address] ?  ? Special Requests   Final  ?  BOTTLES DRAWN AEROBIC AND ANAEROBIC Blood Culture adequate volume ?Performed at [facility], [address] [place], [place] [address] ?  ? Culture  Setup Time   Final  ?  GRAM POSITIVE COCCI ?IN BOTH AEROBIC AND ANAEROBIC BOTTLES ?CRITICAL RESULT CALLED TO, READ BACK BY AND VERIFIED WITH: Botines [id number] FCP ?Performed at [facility], [place] [address] [place], [place] [address] ?  ? Culture ENTEROCOCCUS FAECALIS (A)  Final  ? Report Status 12/24/2021 FINAL  Final  ? Organism ID, Bacteria ENTEROCOCCUS FAECALIS  Final  ?    Susceptibility  ? Enterococcus faecalis - MIC*  ?  AMPICILLIN <=2 SENSITIVE Sensitive   ?  VANCOMYCIN 1 SENSITIVE Sensitive   ?  GENTAMICIN SYNERGY SENSITIVE Sensitive   ?  * ENTEROCOCCUS FAECALIS  ?Resp Panel by RT-PCR (Flu A&B, Covid) Nasopharyngeal Swab     Status: None  ? Collection Time: 12/21/21  8:33 PM  ? Specimen: Nasopharyngeal Swab;  Nasopharyngeal(NP) swabs in vial transport medium  ?Result Value Ref Range Status  ? SARS Coronavirus 2 by RT PCR NEGATIVE NEGATIVE Final  ?  Comment: (NOTE) ?SARS-CoV-2 target nucleic acids are NOT DETECTED. ? ?The SARS-CoV-2 RNA is generally detectable in upper respiratory ?specimens during the acute phase of infection. The lowest ?concentration of SARS-CoV-2 viral copies this assay can detect is ?138 copies/mL. A negative result does not preclude SARS-Cov-2 ?infection and should not be used as the sole basis for treatment or ?other patient management decisions. A negative result may occur with  ?improper specimen collection/handling, submission of specimen other ?than nasopharyngeal swab, presence of viral mutation(s) within the ?areas targeted by  this assay, and inadequate number of viral ?copies(<138 copies/mL). A negative result must be combined with ?clinical observations, patient history, and epidemiological ?information. The expected result is Negative. ? ?Fact Sheet for Patients:  ?EntrepreneurPulse.com.au ? ?Fact Sheet for Healthcare Providers:  ?IncredibleEmployment.be ? ?This test is no t yet approved or cleared by the [place] FDA and  ?has been authorized for detection and/or diagnosis of SARS-CoV-2 by ?FDA under an Emergency Use Authorization (EUA). This EUA will remain  ?in effect (meaning this test can be used) for the duration of the ?COVID-19 declaration under Section 564(b)(1) of the Act, 21 ?U.S.C.section 360bbb-3(b)(1), unless the authorization is terminated  ?or revoked sooner.  ? ? ?  ? Influenza A by PCR NEGATIVE NEGATIVE Final  ? Influenza B by PCR NEGATIVE NEGATIVE Final  ?  Comment: (NOTE) ?The Xpert Xpress SARS-CoV-2/FLU/RSV plus assay is intended as an aid ?in the diagnosis of influenza from Nasopharyngeal swab specimens and ?should not be used as a sole basis for treatment. Nasal washings and ?aspirates are unacceptable for Xpert Xpress  SARS-CoV-2/FLU/RSV ?testing. ? ?Fact Sheet for Patients: ?EntrepreneurPulse.com.au ? ?Fact Sheet for Healthcare Providers: ?IncredibleEmployment.be ? ?This test is not yet approved or cleared by the U

## 2021-12-27 NOTE — Progress Notes (Signed)
[facility] ?PROGRESS NOTE ? ? ? ?Progress Note  ?Jason Jarvis  GBE:[id number] DOB: July 26, 1951 DOA: 12/21/2021 ?PCP: Jeni Salles, MD  ? ? ? ?Brief Narrative:  ? ?Jason Jarvis is an 71 y.o. 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  As he was febrile in the ED and with a white count of 15 there was a concern for infection he was started empirically on antibiotics, scrotal ultrasound showed microcalcifications of the right testicle without abscess fluid or signs of infection, blood cultures on 12/21/2021 returned positive for Enterococcus faecalis in the setting of foreign body pacemaker, bilateral lower extremity Doppler was negative for DVT.  Infectious disease was consulted and 2D echo showed no evidence of vegetation cardiology was consulted for TEE on 12/26/2021 surveillance blood culture on 12/24/2021 has been negative till date ? ? ? ?Assessment/Plan:  ? ?Sepsis ([place]) due to Enterococcus faecalis bacteremia/and first endocarditis: ?Continue IV Rocephin and ampicillin. ?Initial blood cultures was positive for Enterococcus faecalis ?Surveillance blood cultures on 12/24/2021 have been negative till date. ?TEE was done that showed 2 chamber pacemaker with possible tiny vegetation on the RA lead in the distal aspect. ?Cardiology has been consulted 2D echo on 12/26/2021. ?Antibiotic recommendations per ID. ?We will go ahead and insert PICC line. ? ?Indeterminate age DVT: ?Placed on full dose of Lovenox. ?After TEE can transition to  a DOAC ? ?Acute metabolic encephalopathy likely due to sepsis: ?Now resolved. ? ?Hypovolemic hyponatremia: ?Resolved with fluid resuscitation. ? ?Diabetes mellitus type II: ?With an A1c of 6.5, continue sliding scale insulin. ? ?CAD: ?Continue aspirin Lipitor. ? ?Scrotal pain: ?No evidence of cystitis epididymitis. ?Scrotal ultrasound was unremarkable. ?Scrotal pain now resolved. ? ?Acute on chronic systolic heart failure: ?Appears euvolemic on physical exam KVO IV fluids. ?Continue strict I's and O's and daily weights. ? ? ?DVT prophylaxis: lovenox ?Family Communication: Wife ?Status is: Inpatient ?Remains inpatient appropriate because: Enterococcus bacteremia needs a TEE scheduled for next week ? ? ? ?Code Status:  ? ?  ?Code Status Orders  ?(From admission, onward)  ?  ? ? ?  ? ?  Start     Ordered  ? 12/22/21 0201  Full code  Continuous       ? 12/22/21 0201  ? ?  ?  ? ?  ? ?Code Status History   ? ? This patient has a current code status but no historical code status.  ? ?  ? ? ? ? ?IV Access:  ? ?Peripheral IV ? ? ?Procedures and diagnostic studies:  ? ?ECHO TEE ? ?Result Date: 12/26/2021 ?   TRANSESOPHOGEAL ECHO REPORT   Patient Name:   Jason Jarvis Date of Exam: 12/26/2021 Medical Rec #:  [id number]     Height:       69.0 in Accession #:    [id number]    Weight:       194.0 lb Date of Birth:  Dec 17, 1950     BSA:          2.039 m? Patient Age:    29 years      BP:  177/96 mmHg Patient Gender: M             HR:           60 bpm. Exam Location:  Inpatient Procedure: Transesophageal Echo, Cardiac Doppler, Color Doppler and Saline            Contrast Bubble Study Indications:     Bacteremia  History:         Patient has prior history of Echocardiogram examinations, most                  recent 12/23/2021. CAD, Abnormal ECG and Pacemaker,                  Arrythmias:Atrial Fibrillation and Bradycardia,                  Signs/Symptoms:Altered Mental Status and Chest Pain; Risk                   Factors:Dyslipidemia and Hypertension.  Sonographer:     Roseanna Rainbow RDCS Referring Phys:  [id number] Margie Billet Diagnosing Phys: Cherlynn Kaiser MD PROCEDURE: After discussion of the risks and benefits of a TEE, an informed consent was obtained from the patient. The transesophogeal probe was passed without difficulty through the esophogus of the patient. Imaged were obtained with the patient in a left lateral decubitus position. Sedation performed by different physician. The patient was monitored while under deep sedation. Anesthestetic sedation was provided intravenously by Anesthesiology: '500mg'$  of Propofol. The patient's vital signs; including heart rate, blood pressure, and oxygen saturation; remained stable throughout the procedure. The patient developed no complications during the procedure. IMPRESSIONS  1. Dual chamber pacemaker seen. Possible tiny vegetation on RA lead. At the distal aspect of the RV lead, there is an independently mobile, heterogeneous echo density that may be partially calcified. 1 x 0.7 cm. In the setting of bacteremia, this is most consistent with pacemaker lead vegetation. Right ventricular systolic function is normal. The right ventricular size is normal.  2. Left ventricular ejection fraction, by estimation, is 60 to 65%. The left ventricle has normal function.  3. No left atrial/left atrial appendage thrombus was detected. The LAA emptying velocity was 98 cm/s.  4. A small pericardial effusion is present.  5. The mitral valve is grossly normal. Trivial mitral valve regurgitation.  6. The aortic valve is tricuspid. Aortic valve regurgitation is trivial.  7. Aortic dilatation noted. There is borderline dilatation of the aortic root, measuring 38 mm. There is borderline dilatation of the ascending aorta, measuring 39 mm.  8. Agitated saline contrast bubble study was negative, with no evidence of any interatrial shunt. FINDINGS  Left Ventricle: Left ventricular ejection fraction, by  estimation, is 60 to 65%. The left ventricle has normal function. The left ventricular internal cavity size was normal in size. Right Ventricle: Dual chamber pacemaker seen. Possible tiny vegetation on RA lead. At the distal aspect of the RV lead, there is an independently mobile, heterogeneous echo density that may be partially calcified. 1 x 0.7 cm. In the setting of bacteremia, this is most consistent with pacemaker lead vegetation. The right ventricular size is normal. No increase in right ventricular wall thickness. Right ventricular systolic function is normal. Left Atrium: Left atrial size was normal in size. No left atrial/left atrial appendage thrombus was detected. The LAA emptying velocity was 98 cm/s. Right Atrium: Right atrial size was normal in size. Pericardium: A small pericardial effusion is present. Mitral Valve: The mitral  valve is grossly normal. There is mild calcification of the mitral valve leaflet(s). Trivial mitral valve regurgitation. There is no evidence of mitral valve vegetation. Tricuspid Valve: The tricuspid valve is grossly normal. Tricuspid valve regurgitation is mild. Aortic Valve: The aortic valve is tricuspid. Aortic valve regurgitation is trivial. There is no evidence of aortic valve vegetation. Pulmonic Valve: The pulmonic valve was normal in structure. Pulmonic valve regurgitation is trivial. Aorta: Aortic dilatation noted. There is borderline dilatation of the aortic root, measuring 38 mm. There is borderline dilatation of the ascending aorta, measuring 39 mm. There is minimal (Grade I) atheroma plaque involving the aortic arch. IAS/Shunts: No atrial level shunt detected by color flow Doppler. Agitated saline contrast was given intravenously to evaluate for intracardiac shunting. Agitated saline contrast bubble study was negative, with no evidence of any interatrial shunt. Additional Comments: A device lead is visualized in the right atrium and right ventricle.   AORTA Ao Root  diam: 3.80 cm Ao Asc diam:  3.90 cm Cherlynn Kaiser MD Electronically signed by Cherlynn Kaiser MD Signature Date/Time: 12/26/2021/9:36:21 PM    Final    ? ? ?Medical Consultants:  ? ?None. ? ? ?Subjective:  ? ? ?Gwenlyn Perking [employer]

## 2021-12-27 NOTE — Progress Notes (Signed)
Physical Therapy Treatment ?Patient Details ?Name: Jason Jarvis ?MRN: [id number] ?DOB: 10/29/1950 ?Today's Date: 12/27/2021 ? ? ?History of Present Illness Bonifacio Pruden is an 71 y.o. 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 Patient admited for sepsis, bacteremia, scrotal pain, AMS. ? ?  ?PT Comments  ? ? Pt able to ambulate around the unit with decreased step length initially which improved. He required cues for posture and used IV pole for steadying balance.  Discussed with nursing having him walk more often with nursing staff.   ?Recommendations for follow up therapy are one component of a multi-disciplinary discharge planning process, led by the attending physician.  Recommendations may be updated based on patient status, additional functional criteria and insurance authorization. ? ?Follow Up Recommendations ? No PT follow up ?  ?  ?Assistance Recommended at Discharge PRN  ?Patient can return home with the following   ?  ?Equipment Recommendations ? None recommended by PT  ?  ?Recommendations for Other Services   ? ? ?  ?Precautions / Restrictions Precautions ?Precautions: Fall ?Restrictions ?Weight Bearing Restrictions: No  ?  ? ?Mobility ? Bed Mobility ?  ?  ?  ?  ?  ?  ?  ?General bed mobility comments: up in chair ?  ? ?Transfers ?Overall transfer level: Modified independent ?  ?  ?  ?  ?  ?  ?  ?  ?  ?  ? ?Ambulation/Gait ?Ambulation/Gait assistance: Min guard, Supervision ?Gait Distance (Feet): 350 Feet ?Assistive device: IV Pole, None ?Gait Pattern/deviations:  Step-through pattern, Decreased step length - right, Decreased step length - left ?Gait velocity: decreased ?  ?  ?General Gait Details: Took a bit for his legs to warm up and to increase step length.  Cues for posture.  Used IV pole half the time. ? ? ?Stairs ?  ?  ?  ?  ?  ? ? ?Wheelchair Mobility ?  ? ?Modified Rankin (Stroke Patients Only) ?  ? ? ?  ?Balance Overall balance assessment: Mild deficits observed, not formally tested ?  ?  ?  ?  ?  ?  ?  ?  ?  ?  ?  ?  ?  ?  ?  ?  ?  ?  ?  ? ?  ?Cognition Arousal/Alertness: Awake/alert ?Behavior During Therapy: [facility] for tasks assessed/performed ?Overall Cognitive Status: Within Functional Limits for tasks assessed ?  ?  ?  ?  ?  ?  ?  ?  ?  ?  ?  ?  ?  ?  ?  ?  ?  ?  ?  ? ?  ?Exercises   ? ?  ?General Comments   ?  ?  ? ?Pertinent Vitals/Pain Pain Assessment ?Pain Assessment: No/denies pain  ? ? ?Home Living   ?  ?  ?  ?  ?  ?  ?  ?  ?  ?   ?  ?Prior Function    ?  ?  ?   ? ?PT Goals (current goals can now be found in the care plan section) Progress towards PT goals: Progressing toward goals ? ?  ?  Frequency ? ? ? Min 3X/week ? ? ? ?  ?PT Plan Current plan remains appropriate  ? ? ?Co-evaluation   ?  ?  ?  ?  ? ?  ?AM-PAC PT "6 Clicks" Mobility   ?Outcome Measure ? Help needed turning from your back to your side while in a flat bed without using bedrails?: None ?Help needed moving from lying on your back to sitting on the side of a flat bed without using bedrails?: None ?Help needed moving to and from a bed to a chair (including a wheelchair)?: None ?Help needed standing up from a chair using your arms (e.g., wheelchair or bedside chair)?: None ?Help needed to walk in hospital room?: A Little ?Help needed climbing 3-5 steps with a railing? : A Little ?6 Click Score: 22 ? ?  ?End of Session Equipment Utilized During Treatment: Gait belt ?Activity Tolerance: Patient tolerated treatment well ?Patient left: in bed;with call bell/phone within reach ?Nurse Communication:  Mobility status ?PT Visit Diagnosis: Unsteadiness on feet (R26.81) ?  ? ? ?Time: 1210-1221 ?PT Time Calculation (min) (ACUTE ONLY): 11 min ? ?Charges:  $Gait Training: 8-22 mins          ?          ? ?Santiago Glad L. Tamala Julian, PT ? ?12/27/2021 ? ? ? ?Galen Manila ?12/27/2021, 12:43 PM ? ?

## 2021-12-27 NOTE — Anesthesia Postprocedure Evaluation (Signed)
Anesthesia Post Note ? ?Patient: Jason Jarvis ? ?Procedure(s) Performed: TRANSESOPHAGEAL ECHOCARDIOGRAM (TEE) ?BUBBLE STUDY ? ?  ? ?Patient location during evaluation: Endoscopy ?Anesthesia Type: MAC ?Level of consciousness: awake and alert ?Pain management: pain level controlled ?Vital Signs Assessment: post-procedure vital signs reviewed and stable ?Respiratory status: spontaneous breathing ?Cardiovascular status: stable ?Anesthetic complications: no ? ? ?No notable events documented. ? ?Last Vitals:  ?Vitals:  ? 12/26/21 2207 12/27/21 0433  ?BP: 137/67 (!) 144/72  ?Pulse:  60  ?Resp: 15 20  ?Temp:  36.9 ?C  ?SpO2:  98%  ?  ?Last Pain:  ?Vitals:  ? 12/27/21 0730  ?TempSrc:   ?PainSc: 0-No pain  ? ? ?  ?  ?  ?  ?  ?  ? ?Nolon Nations ? ? ? ? ?

## 2021-12-28 ENCOUNTER — Inpatient Hospital Stay: Payer: Self-pay

## 2021-12-28 DIAGNOSIS — I482 Chronic atrial fibrillation, unspecified: Secondary | ICD-10-CM | POA: Diagnosis not present

## 2021-12-28 DIAGNOSIS — T827XXA Infection and inflammatory reaction due to other cardiac and vascular devices, implants and grafts, initial encounter: Secondary | ICD-10-CM

## 2021-12-28 DIAGNOSIS — I5023 Acute on chronic systolic (congestive) heart failure: Secondary | ICD-10-CM | POA: Diagnosis not present

## 2021-12-28 DIAGNOSIS — R7881 Bacteremia: Secondary | ICD-10-CM

## 2021-12-28 DIAGNOSIS — A419 Sepsis, unspecified organism: Secondary | ICD-10-CM | POA: Diagnosis not present

## 2021-12-28 DIAGNOSIS — G9341 Metabolic encephalopathy: Secondary | ICD-10-CM | POA: Diagnosis not present

## 2021-12-28 LAB — CBC WITH DIFFERENTIAL/PLATELET
Abs Immature Granulocytes: 0.05 10*3/uL (ref 0.00–0.07)
Basophils Absolute: 0 10*3/uL (ref 0.0–0.1)
Basophils Relative: 0 %
Eosinophils Absolute: 0.2 10*3/uL (ref 0.0–0.5)
Eosinophils Relative: 3 %
HCT: 38.1 % — ABNORMAL LOW (ref 39.0–52.0)
Hemoglobin: 12.1 g/dL — ABNORMAL LOW (ref 13.0–17.0)
Immature Granulocytes: 1 %
Lymphocytes Relative: 21 %
Lymphs Abs: 1.5 10*3/uL (ref 0.7–4.0)
MCH: 27.2 pg (ref 26.0–34.0)
MCHC: 31.8 g/dL (ref 30.0–36.0)
MCV: 85.6 fL (ref 80.0–100.0)
Monocytes Absolute: 0.5 10*3/uL (ref 0.1–1.0)
Monocytes Relative: 7 %
Neutro Abs: 4.7 10*3/uL (ref 1.7–7.7)
Neutrophils Relative %: 68 %
Platelets: 173 10*3/uL (ref 150–400)
RBC: 4.45 MIL/uL (ref 4.22–5.81)
RDW: 14.6 % (ref 11.5–15.5)
WBC: 7 10*3/uL (ref 4.0–10.5)
nRBC: 0 % (ref 0.0–0.2)

## 2021-12-28 LAB — COMPREHENSIVE METABOLIC PANEL
ALT: 38 U/L (ref 0–44)
AST: 24 U/L (ref 15–41)
Albumin: 3.2 g/dL — ABNORMAL LOW (ref 3.5–5.0)
Alkaline Phosphatase: 83 U/L (ref 38–126)
Anion gap: 5 (ref 5–15)
BUN: 22 mg/dL (ref 8–23)
CO2: 25 mmol/L (ref 22–32)
Calcium: 8.7 mg/dL — ABNORMAL LOW (ref 8.9–10.3)
Chloride: 109 mmol/L (ref 98–111)
Creatinine, Ser: 1.03 mg/dL (ref 0.61–1.24)
GFR, Estimated: >60 mL/min (ref >60)
Glucose, Bld: 114 mg/dL — ABNORMAL HIGH (ref 70–99)
Potassium: 4.2 mmol/L (ref 3.5–5.1)
Sodium: 139 mmol/L (ref 135–145)
Total Bilirubin: 0.5 mg/dL (ref 0.3–1.2)
Total Protein: 6.1 g/dL — ABNORMAL LOW (ref 6.5–8.1)

## 2021-12-28 LAB — GLUCOSE, CAPILLARY
Glucose-Capillary: 101 mg/dL — ABNORMAL HIGH (ref 70–99)
Glucose-Capillary: 128 mg/dL — ABNORMAL HIGH (ref 70–99)
Glucose-Capillary: 130 mg/dL — ABNORMAL HIGH (ref 70–99)
Glucose-Capillary: 118 mg/dL — ABNORMAL HIGH (ref 70–99)

## 2021-12-28 LAB — PROTIME-INR
INR: 1.1 (ref 0.8–1.2)
Prothrombin Time: 13.9 seconds (ref 11.4–15.2)

## 2021-12-28 NOTE — Progress Notes (Addendum)
[facility] ?PROGRESS NOTE ? ? ? ?Progress Note  ?Jason Jarvis  MBW:[id number] DOB: [birth year] DOA: 12/21/2021 ?PCP: Jeni Salles, MD  ? ? ? ?Brief Narrative:  ? ?Jason Jarvis is an 71 y.o. 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  As he was febrile in the ED and with a white count of 15 there was a concern for infection he was started empirically on antibiotics, scrotal ultrasound showed microcalcifications of the right testicle without abscess fluid or signs of infection, blood cultures on 12/21/2021 returned positive for Enterococcus faecalis in the setting of foreign body pacemaker, bilateral lower extremity Doppler was negative for DVT.  Infectious disease was consulted and 2D echo showed no evidence of vegetation cardiology was consulted for TEE on 12/26/2021 surveillance blood culture on 12/24/2021 has been negative till date ? ? ? ?Assessment/Plan:  ? ?Sepsis ([place]) due to Enterococcus faecalis bacteremia/and infected pacemaker lead: ?Continue IV Rocephin and ampicillin. ?Initial blood cultures was positive for Enterococcus faecalis ?Surveillance blood cultures on 12/24/2021 have been negative till date. ?TEE was done that showed 2 chamber pacemaker with possible tiny vegetation on the RA lead in the distal aspect. ?We will go ahead and send PICC line today. ?EP has been consulted, who recommended [facility]. ? ?Indeterminate age DVT: ?Placed on full dose of Lovenox. ?After cardiology evaluation can  transition to a DOAC ? ?Acute metabolic encephalopathy likely due to sepsis: ?Now resolved. ? ?Hypovolemic hyponatremia: ?Resolved with fluid resuscitation. ? ?Diabetes mellitus type II: ?With an A1c of 6.5, continue sliding scale insulin. ? ?CAD: ?Continue aspirin Lipitor. ? ?Scrotal pain: ?No evidence of cystitis epididymitis. ?Scrotal ultrasound was unremarkable. ?Scrotal pain now resolved. ? ?Acute on chronic systolic heart failure: ?Appears euvolemic on physical exam KVO IV fluids. ?Continue strict I's and O's and daily weights. ? ? ?DVT prophylaxis: lovenox ?Family Communication: Wife ?Status is: Inpatient ?Remains inpatient appropriate because: Enterococcus bacteremia needs a TEE scheduled for next week ? ? ? ?Code Status:  ? ?  ?Code Status Orders  ?(From admission, onward)  ?  ? ? ?  ? ?  Start     Ordered  ? 12/22/21 0201  Full code  Continuous       ? 12/22/21 0201  ? ?  ?  ? ?  ? ?Code Status History   ? ? This patient has a current code status but no historical code status.  ? ?  ? ? ? ? ?IV Access:  ? ?Peripheral IV ? ? ?Procedures and diagnostic studies:  ? ?ECHO TEE ? ?Result Date: 12/26/2021 ?   TRANSESOPHOGEAL ECHO REPORT   Patient Name:   Jason Jarvis Date of Exam: 12/26/2021 Medical Rec #:  [id number]     Height:       69.0 in Accession #:    [id number]    Weight:       194.0 lb Date of Birth:  [birth year]     BSA:          2.039 m? Patient Age:    3 years  BP:           177/96 mmHg Patient Gender: M             HR:           60 bpm. Exam Location:  Inpatient Procedure: Transesophageal Echo, Cardiac Doppler, Color Doppler and [place]            Contrast Bubble Study Indications:     Bacteremia  History:         Patient has prior history of Echocardiogram examinations, most                  recent 12/23/2021. CAD, Abnormal ECG and Pacemaker,                  Arrythmias:Atrial Fibrillation and Bradycardia,                  Signs/Symptoms:Altered Mental Status and Chest Pain; Risk                   Factors:Dyslipidemia and Hypertension.  Sonographer:     Roseanna Rainbow RDCS Referring Phys:  [id number] Margie Billet Diagnosing Phys: Cherlynn Kaiser MD PROCEDURE: After discussion of the risks and benefits of a TEE, an informed consent was obtained from the patient. The transesophogeal probe was passed without difficulty through the esophogus of the patient. Imaged were obtained with the patient in a left lateral decubitus position. Sedation performed by different physician. The patient was monitored while under deep sedation. Anesthestetic sedation was provided intravenously by Anesthesiology: '500mg'$  of Propofol. The patient's vital signs; including heart rate, blood pressure, and oxygen saturation; remained stable throughout the procedure. The patient developed no complications during the procedure. IMPRESSIONS  1. Dual chamber pacemaker seen. Possible tiny vegetation on RA lead. At the distal aspect of the RV lead, there is an independently mobile, heterogeneous echo density that may be partially calcified. 1 x 0.7 cm. In the setting of bacteremia, this is most consistent with pacemaker lead vegetation. Right ventricular systolic function is normal. The right ventricular size is normal.  2. Left ventricular ejection fraction, by estimation, is 60 to 65%. The left ventricle has normal function.  3. No left atrial/left atrial appendage thrombus was detected. The LAA emptying velocity was 98 cm/s.  4. A small pericardial effusion is present.  5. The mitral valve is grossly normal. Trivial mitral valve regurgitation.  6. The aortic valve is tricuspid. Aortic valve regurgitation is trivial.  7. Aortic dilatation noted. There is borderline dilatation of the aortic root, measuring 38 mm. There is borderline dilatation of the ascending aorta, measuring 39 mm.  8. Agitated saline contrast bubble study was negative, with no evidence of any interatrial shunt. FINDINGS  Left Ventricle: Left ventricular ejection fraction, by  estimation, is 60 to 65%. The left ventricle has normal function. The left ventricular internal cavity size was normal in size. Right Ventricle: Dual chamber pacemaker seen. Possible tiny vegetation on RA lead. At the distal aspect of the RV lead, there is an independently mobile, heterogeneous echo density that may be partially calcified. 1 x 0.7 cm. In the setting of bacteremia, this is most consistent with pacemaker lead vegetation. The right ventricular size is normal. No increase in right ventricular wall thickness. Right ventricular systolic function is normal. Left Atrium: Left atrial size was normal in size. No left atrial/left atrial appendage thrombus was detected. The LAA emptying velocity was 98 cm/s. Right Atrium: Right atrial size was normal in size.  Pericardium: A small pericardial effusion is present. Mitral Valve: The mitral valve is grossly normal. There is mild calcification of the mitral valve leaflet(s). Trivial mitral valve regurgitation. There is no evidence of mitral valve vegetation. Tricuspid Valve: The tricuspid valve is grossly normal. Tricuspid valve regurgitation is mild. Aortic Valve: The aortic valve is tricuspid. Aortic valve regurgitation is trivial. There is no evidence of aortic valve vegetation. Pulmonic Valve: The pulmonic valve was normal in structure. Pulmonic valve regurgitation is trivial. Aorta: Aortic dilatation noted. There is borderline dilatation of the aortic root, measuring 38 mm. There is borderline dilatation of the ascending aorta, measuring 39 mm. There is minimal (Grade I) atheroma plaque involving the aortic arch. IAS/Shunts: No atrial level shunt detected by color flow Doppler. Agitated saline contrast was given intravenously to evaluate for intracardiac shunting. Agitated saline contrast bubble study was negative, with no evidence of any interatrial shunt. Additional Comments: A device lead is visualized in the right atrium and right ventricle.   AORTA Ao Root  diam: 3.80 cm Ao Asc diam:  3.90 cm Cherlynn Kaiser MD Electronically signed by Cherlynn Kaiser MD Signature Date/Time: 12/26/2021/9:36:21 PM    Final   ? ?[place] EKG SITE RITE ? ?Result Date: 12/27/2021 ?If [facility]

## 2021-12-28 NOTE — Consult Note (Signed)
?Cardiology Consultation:  ? ?Patient ID: Jason Jarvis ?MRN: [id number]; DOB: 04/20/1951 ? ?Admit date: 12/21/2021 ?Date of Consult: 12/28/2021 ? ?PCP:  Jeni Salles, MD ?  ?[facility] Providers ?Cardiologist:  Dr. Violet Baldy   ? ? ?Patient Profile:  ? ?Jason Jarvis is a 71 y.o. male with a hx of bradycardia and Staph endocarditis who is being seen 12/28/2021 for the evaluation of bacteremia and PM lead endocarditis at the request of Dr. Olevia Bowens. ? ?History of Present Illness:  ? ?Jason Jarvis 71 year old man with a history of bradycardia, who underwent permanent pacemaker insertion at [facility] in 2018.  He underwent removal of his pacemaker in 2019 secondary to staph aureus bacteremia and pacemaker endocarditis.  He underwent repeat pacemaker implantation on the contralateral side in 2019.  The patient was in his usual state of health until 71 year old man when he had fatigue, weakness, altered mentation, and fever and was taken to the emergency room where he was found to be septic and treated with broad-spectrum antibiotics.  Blood cultures have grown enterococcal faecalis.  The patient has been gradually improving.  He underwent transesophageal echo which demonstrated pacemaker lead endocarditis.  He is referred now to consider pacemaker system extraction.  We do not know whether or not he is dependent on his pacemaker though his wife states that he was able to be treated with antibiotics after his last pacemaker was removed without a temporary wire placed. ? ? ?Past Medical History:  ?Diagnosis Date  ? Arm DVT (deep venous thromboembolism), acute ([place])   ? Chronic kidney disease   ? Coronary artery disease   ? Hypertension   ? Myocardial infarction [facility])   ? pt states that he had a "light heart attack about 25 yrs ago"  ? Pacemaker   ? Pneumonia   ? Stroke [facility])   ? approx 15 yrs ago  ? ? ?Past Surgical History:  ?Procedure Laterality Date  ? IR GENERIC HISTORICAL  04/22/2016  ? IR  RADIOLOGIST EVAL & MGMT 04/22/2016 Greggory Keen, MD [facility] INTERV RAD  ? IR RADIOLOGIST EVAL & MGMT  05/18/2019  ? IR RADIOLOGIST EVAL & MGMT  07/06/2019  ? IR RADIOLOGIST EVAL & MGMT  10/26/2019  ? IR RADIOLOGIST EVAL & MGMT  10/30/2020  ? IR RADIOLOGIST EVAL & MGMT  11/01/2021  ? PACEMAKER IMPLANT  05/2017  ? PACEMAKER REMOVAL  07/2017  ?  ? ?Home Medications:  ?Prior to Admission medications   ?Medication Sig Start Date End Date Taking? Authorizing Provider  ?amLODipine (NORVASC) 2.5 MG tablet  01/23/16  Yes [provider]  ?aspirin EC 81 MG tablet Take 81 mg by mouth daily.   Yes [provider]  ?atorvastatin (LIPITOR) 40 MG tablet Take 40 mg by mouth daily.   Yes [provider]  ?carisoprodol (SOMA) 350 MG tablet Take 350 mg by mouth at bedtime as needed for muscle spasms.   Yes [provider]  ?doxazosin (CARDURA) 2 MG tablet Take 2 mg by mouth at bedtime.   Yes [provider]  ?furosemide (LASIX) 40 MG tablet Take 40 mg by mouth daily.   Yes [provider]  ?HYDROcodone-acetaminophen (NORCO/VICODIN) 5-325 MG tablet Take 1-2 tablets by mouth every 4 (four) hours as needed. ?Patient taking differently: Take 1 tablet by mouth daily as needed for moderate pain. 09/09/17  Yes Virgel Manifold, MD  ?IFEREX 150 150 MG capsule Take 150 mg by mouth 2 (two) times daily.  10/07/21  Yes [provider]  ?Insulin Aspart Prot & Aspart (NOVOLOG MIX 70/30 [place]) Inject 30 Units into the skin every morning.   Yes [provider]  ?losartan (COZAAR) 100 MG tablet Take 50 mg by mouth daily.   Yes [provider]  ?metoprolol succinate (TOPROL-XL) 50 MG 24 hr tablet Take 50 mg by mouth daily. 11/30/21  Yes [provider]  ?Multiple Vitamins-Minerals (CENTRUM SILVER ADULT 50+ PO) Take 1 tablet by mouth daily.   Yes [provider]  ?nitroGLYCERIN (NITROSTAT) 0.4 MG SL tablet Place 0.4 mg under the tongue every 5 (five) minutes as needed for  chest pain.   Yes [provider]  ?pantoprazole (PROTONIX) 40 MG tablet Take 40 mg by mouth 2 (two) times daily. 11/30/21  Yes [provider]  ?potassium chloride (K-DUR) 10 MEQ tablet Take 10 mEq by mouth daily.   Yes [provider]  ?SYMBICORT 160-4.5 MCG/ACT inhaler Inhale 2 puffs into the lungs in the morning and at bedtime. 12/19/21  Yes [provider]  ?triamcinolone cream (KENALOG) 0.1 % Apply 1 application. topically 2 (two) times daily as needed (itching). 12/14/21  Yes [provider]  ?traMADol (ULTRAM) 50 MG tablet Take 50 mg by mouth 2 (two) times daily as needed. ?Patient not taking: Reported on 12/22/2021 12/16/21   [provider]  ? ? ?Inpatient Medications: ?Scheduled Meds: ? amLODipine  2.5 mg Oral Daily  ? aspirin EC  81 mg Oral Daily  ? atorvastatin  40 mg Oral Daily  ? doxazosin  2 mg Oral QHS  ? enoxaparin (LOVENOX) injection  1 mg/kg Subcutaneous Q12H  ? furosemide  40 mg Oral Daily  ? insulin aspart  0-5 Units Subcutaneous QHS  ? insulin aspart  0-9 Units Subcutaneous TID WC  ? losartan  50 mg Oral Daily  ? metoprolol succinate  50 mg Oral Daily  ? pantoprazole  40 mg Oral BID  ? potassium chloride  10 mEq Oral Daily  ? senna-docusate  2 tablet Oral Daily  ? ?Continuous Infusions: ? sodium chloride Stopped (12/27/21 0550)  ? ampicillin (OMNIPEN) IV 2 g (12/28/21 0858)  ? cefTRIAXone (ROCEPHIN)  IV Stopped (12/28/21 0029)  ? ?PRN Meds: ?sodium chloride, acetaminophen, melatonin, nitroGLYCERIN, ondansetron (ZOFRAN) IV, oxyCODONE, polyethylene glycol, traMADol ? ?Allergies:    ?Allergies  ?Allergen Reactions  ? Cephalexin Hives  ? Lorazepam Other (See Comments)  ?  Hallucinated / Confused / Combative  ? Ace Inhibitors Cough  ?   ?  ? Prednisone Rash  ? Sulfa Antibiotics Rash  ? ? ?Social History:   ?Social History  ? ?Socioeconomic History  ? Marital status: Married  ?  Spouse name: Not on file  ? Number of children: Not on file  ? Years of  education: Not on file  ? Highest education level: Not on file  ?Occupational History  ? Not on file  ?Tobacco Use  ? Smoking status: Never  ? Smokeless tobacco: Never  ?Substance and Sexual Activity  ? Alcohol use: No  ?  Alcohol/week: 0.0 standard drinks  ? Drug use: No  ? Sexual activity: Not on file  ?Other Topics Concern  ? Not on file  ?Social History Narrative  ? Not on file  ? ?Social Determinants of Health  ? ?Financial Resource Strain: Not on file  ?Food Insecurity: Not on file  ?Transportation Needs: Not on file  ?Physical Activity: Not on file  ?Stress: Not on file  ?Social Connections:  Not on file  ?Intimate Partner Violence: Not on file  ?  ?Family History:   ?History reviewed. No pertinent family history.  ? ?ROS:  ?Please see the history of present illness.  ?The patient was symptomatic for only a few days prior to his presentation ?All other ROS reviewed and negative.    ? ?Physical Exam/Data:  ? ?Vitals:  ? 12/27/21 1942 12/27/21 2113 12/28/21 0500 12/28/21 0505  ?BP: (!) 155/77 (!) 163/77  (!) 167/78  ?Pulse: (!) 59 62  60  ?Resp: 19 (!) 22  18  ?Temp: 98.5 ?F (36.9 ?C)   98.2 ?F (36.8 ?C)  ?TempSrc: Oral   Oral  ?SpO2: 99%   98%  ?Weight:   87.1 kg   ?Height:      ? ? ?Intake/Output Summary (Last 24 hours) at 12/28/2021 1153 ?Last data filed at 12/28/2021 0509 ?Gross per 24 hour  ?Intake 1352.09 ml  ?Output 800 ml  ?Net 552.09 ml  ? ? ?  12/28/2021  ?  5:00 AM 12/27/2021  ?  5:00 AM 12/26/2021  ? 12:40 PM  ?Last 3 Weights  ?Weight (lbs) 192 lb 0.3 oz 194 lb 3.6 oz 194 lb 0.1 oz  ?Weight (kg) 87.1 kg 88.1 kg 88 kg  ?   ?Body mass index is 28.36 kg/m?.  ?General:  Well nourished, well developed, in no acute distress ?HEENT: normal ?Neck: no JVD ?Vascular: No carotid bruits; Distal pulses 2+ bilaterally ?Cardiac:  normal S1, S2; RRR; 1/6 systolic murmur  ?Lungs:  clear to auscultation bilaterally, no wheezing, rhonchi or rales  ?Abd: soft, nontender, no hepatomegaly  ?Ext: no edema ?Musculoskeletal:  No  deformities, BUE and BLE strength normal and equal ?Skin: warm and dry  ?Neuro:  CNs 2-12 intact, no focal abnormalities noted ?Psych:  Normal affect  ? ?EKG:  The EKG was personally reviewed and demonst

## 2021-12-28 NOTE — Plan of Care (Signed)

## 2021-12-28 NOTE — Progress Notes (Signed)
Pt transferred to [facility] here to transport pt. Wife left for the remainder of day. SRP, RN ?

## 2021-12-28 NOTE — Progress Notes (Signed)
Handoff report given to [facility], RN---[id number]. Spouse aware. [facility] called for pickup. SRP, RN ?

## 2021-12-28 NOTE — Progress Notes (Signed)
Pt admitted to [facility], VS wnL and as per flow. Pt oriented to 6E processes. AV paced on telemetry. Pt states he had a stroke over 73yr ago but was told that they couldn't prove it. He says he has no residual and his slurred speech has always been present. Purewick on and connected. Pt has glasses and hearing aids on. Pt unfamiliar with the [facility] system. All questions and concerns addressed. Call bell placed within reach, will continue to monitor and maintain safety.  ?

## 2021-12-28 NOTE — Progress Notes (Addendum)
Spoke wit Dr Aileen Fass re PICC placement prior to pacemaker removal.  PICC order to be canceled and ordered once closer to time of discharge.  Sophia RN notified. ?

## 2021-12-29 ENCOUNTER — Other Ambulatory Visit: Payer: Self-pay

## 2021-12-29 ENCOUNTER — Inpatient Hospital Stay (HOSPITAL_COMMUNITY): Payer: Medicare HMO | Admitting: Anesthesiology

## 2021-12-29 ENCOUNTER — Encounter (HOSPITAL_COMMUNITY)
Admission: EM | Disposition: A | Discharge status: Home Health | Payer: Self-pay | Source: Home / Self Care | Attending: Internal Medicine

## 2021-12-29 ENCOUNTER — Inpatient Hospital Stay (HOSPITAL_COMMUNITY): Payer: Medicare HMO

## 2021-12-29 ENCOUNTER — Encounter (HOSPITAL_COMMUNITY): Payer: Self-pay | Admitting: General Practice

## 2021-12-29 DIAGNOSIS — R4182 Altered mental status, unspecified: Secondary | ICD-10-CM | POA: Diagnosis not present

## 2021-12-29 DIAGNOSIS — G9341 Metabolic encephalopathy: Secondary | ICD-10-CM | POA: Diagnosis not present

## 2021-12-29 DIAGNOSIS — I5023 Acute on chronic systolic (congestive) heart failure: Secondary | ICD-10-CM | POA: Diagnosis not present

## 2021-12-29 DIAGNOSIS — I2510 Atherosclerotic heart disease of native coronary artery without angina pectoris: Secondary | ICD-10-CM

## 2021-12-29 DIAGNOSIS — T827XXA Infection and inflammatory reaction due to other cardiac and vascular devices, implants and grafts, initial encounter: Secondary | ICD-10-CM

## 2021-12-29 DIAGNOSIS — A419 Sepsis, unspecified organism: Secondary | ICD-10-CM | POA: Diagnosis not present

## 2021-12-29 DIAGNOSIS — I5022 Chronic systolic (congestive) heart failure: Secondary | ICD-10-CM | POA: Insufficient documentation

## 2021-12-29 DIAGNOSIS — I509 Heart failure, unspecified: Secondary | ICD-10-CM

## 2021-12-29 DIAGNOSIS — I824Z2 Acute embolism and thrombosis of unspecified deep veins of left distal lower extremity: Secondary | ICD-10-CM

## 2021-12-29 DIAGNOSIS — I82441 Acute embolism and thrombosis of right tibial vein: Secondary | ICD-10-CM

## 2021-12-29 DIAGNOSIS — I130 Hypertensive heart and chronic kidney disease with heart failure and stage 1 through stage 4 chronic kidney disease, or unspecified chronic kidney disease: Secondary | ICD-10-CM

## 2021-12-29 HISTORY — PX: PACEMAKER LEAD REMOVAL: SHX5064

## 2021-12-29 HISTORY — DX: Acute embolism and thrombosis of unspecified deep veins of left distal lower extremity: I82.4Z2

## 2021-12-29 LAB — CBC WITH DIFFERENTIAL/PLATELET
Abs Immature Granulocytes: 0.04 10*3/uL (ref 0.00–0.07)
Basophils Absolute: 0.1 10*3/uL (ref 0.0–0.1)
Basophils Relative: 1 %
Eosinophils Absolute: 0.3 10*3/uL (ref 0.0–0.5)
Eosinophils Relative: 4 %
HCT: 37.6 % — ABNORMAL LOW (ref 39.0–52.0)
Hemoglobin: 11.6 g/dL — ABNORMAL LOW (ref 13.0–17.0)
Immature Granulocytes: 1 %
Lymphocytes Relative: 21 %
Lymphs Abs: 1.6 10*3/uL (ref 0.7–4.0)
MCH: 26.1 pg (ref 26.0–34.0)
MCHC: 30.9 g/dL (ref 30.0–36.0)
MCV: 84.5 fL (ref 80.0–100.0)
Monocytes Absolute: 0.4 10*3/uL (ref 0.1–1.0)
Monocytes Relative: 6 %
Neutro Abs: 5 10*3/uL (ref 1.7–7.7)
Neutrophils Relative %: 67 %
Platelets: 181 10*3/uL (ref 150–400)
RBC: 4.45 MIL/uL (ref 4.22–5.81)
RDW: 14.6 % (ref 11.5–15.5)
WBC: 7.3 10*3/uL (ref 4.0–10.5)
nRBC: 0 % (ref 0.0–0.2)

## 2021-12-29 LAB — SURGICAL PCR SCREEN
MRSA, PCR: NEGATIVE
Staphylococcus aureus: POSITIVE — AB

## 2021-12-29 LAB — COMPREHENSIVE METABOLIC PANEL
ALT: 36 U/L (ref 0–44)
AST: 24 U/L (ref 15–41)
Albumin: 2.8 g/dL — ABNORMAL LOW (ref 3.5–5.0)
Alkaline Phosphatase: 78 U/L (ref 38–126)
Anion gap: 8 (ref 5–15)
BUN: 17 mg/dL (ref 8–23)
CO2: 25 mmol/L (ref 22–32)
Calcium: 8.9 mg/dL (ref 8.9–10.3)
Chloride: 106 mmol/L (ref 98–111)
Creatinine, Ser: 1.1 mg/dL (ref 0.61–1.24)
GFR, Estimated: >60 mL/min (ref >60)
Glucose, Bld: 135 mg/dL — ABNORMAL HIGH (ref 70–99)
Potassium: 4.4 mmol/L (ref 3.5–5.1)
Sodium: 139 mmol/L (ref 135–145)
Total Bilirubin: 0.5 mg/dL (ref 0.3–1.2)
Total Protein: 5.7 g/dL — ABNORMAL LOW (ref 6.5–8.1)

## 2021-12-29 LAB — ABO/RH: ABO/RH(D): O NEG

## 2021-12-29 LAB — ECHO INTRAOPERATIVE TEE
Height: 69.016 in
Weight: 3044.8 oz

## 2021-12-29 LAB — PROTIME-INR
INR: 1.1 (ref 0.8–1.2)
Prothrombin Time: 14.3 seconds (ref 11.4–15.2)

## 2021-12-29 LAB — TYPE AND SCREEN
ABO/RH(D): O NEG
Antibody Screen: NEGATIVE

## 2021-12-29 LAB — GLUCOSE, CAPILLARY
Glucose-Capillary: 105 mg/dL — ABNORMAL HIGH (ref 70–99)
Glucose-Capillary: 114 mg/dL — ABNORMAL HIGH (ref 70–99)
Glucose-Capillary: 181 mg/dL — ABNORMAL HIGH (ref 70–99)
Glucose-Capillary: 164 mg/dL — ABNORMAL HIGH (ref 70–99)

## 2021-12-29 SURGERY — REMOVAL, ELECTRODE LEAD, CARDIAC PACEMAKER, WITHOUT REPLACEMENT
Anesthesia: General | Site: Chest

## 2021-12-29 MED ORDER — ACETAMINOPHEN 325 MG PO TABS: 325.0000 mg | ORAL_TABLET | ORAL | Status: DC | PRN

## 2021-12-29 MED ORDER — DEXAMETHASONE SODIUM PHOSPHATE 10 MG/ML IJ SOLN
INTRAMUSCULAR | Status: AC
  Filled 2021-12-29: qty 1

## 2021-12-29 MED ORDER — PHENYLEPHRINE 80 MCG/ML (10ML) SYRINGE FOR IV PUSH (FOR BLOOD PRESSURE SUPPORT)
PREFILLED_SYRINGE | INTRAVENOUS | Status: AC
  Filled 2021-12-29: qty 10

## 2021-12-29 MED ORDER — PROPOFOL 10 MG/ML IV BOLUS
INTRAVENOUS | Status: AC
  Filled 2021-12-29: qty 20

## 2021-12-29 MED ORDER — FENTANYL CITRATE (PF) 250 MCG/5ML IJ SOLN
INTRAMUSCULAR | Status: AC
  Filled 2021-12-29: qty 5

## 2021-12-29 MED ORDER — ONDANSETRON HCL 4 MG/2ML IJ SOLN: 4.0000 mg | Freq: Four times a day (QID) | INTRAMUSCULAR | Status: DC | PRN

## 2021-12-29 MED ORDER — PROPOFOL 10 MG/ML IV BOLUS
INTRAVENOUS | Status: DC | PRN
  Administered 2021-12-29: 60 mg via INTRAVENOUS
  Administered 2021-12-29: 30 mg via INTRAVENOUS

## 2021-12-29 MED ORDER — SODIUM CHLORIDE 0.9 % IV SOLN: 80.0000 mg | INTRAVENOUS | Status: DC

## 2021-12-29 MED ORDER — HYDRALAZINE HCL 20 MG/ML IJ SOLN
INTRAMUSCULAR | Status: AC
  Filled 2021-12-29: qty 1

## 2021-12-29 MED ORDER — PHENYLEPHRINE HCL-NACL 20-0.9 MG/250ML-% IV SOLN
INTRAVENOUS | Status: DC | PRN
  Administered 2021-12-29: 25 ug/min via INTRAVENOUS

## 2021-12-29 MED ORDER — DEXAMETHASONE SODIUM PHOSPHATE 10 MG/ML IJ SOLN
INTRAMUSCULAR | Status: DC | PRN
  Administered 2021-12-29: 4 mg via INTRAVENOUS

## 2021-12-29 MED ORDER — POTASSIUM CHLORIDE CRYS ER 10 MEQ PO TBCR
10.0000 meq | EXTENDED_RELEASE_TABLET | Freq: Every day | ORAL | Status: DC
  Administered 2021-12-29: 10 meq via ORAL
  Filled 2021-12-29 (×2): qty 1

## 2021-12-29 MED ORDER — FENTANYL CITRATE (PF) 250 MCG/5ML IJ SOLN
INTRAMUSCULAR | Status: DC | PRN
  Administered 2021-12-29 (×3): 50 ug via INTRAVENOUS

## 2021-12-29 MED ORDER — ACETAMINOPHEN 10 MG/ML IV SOLN: 1000.0000 mg | Freq: Once | INTRAVENOUS | Status: DC | PRN

## 2021-12-29 MED ORDER — SODIUM CHLORIDE 0.9 % IV SOLN: INTRAVENOUS | Status: DC

## 2021-12-29 MED ORDER — ONDANSETRON HCL 4 MG/2ML IJ SOLN
INTRAMUSCULAR | Status: AC
  Filled 2021-12-29: qty 2

## 2021-12-29 MED ORDER — CHLORHEXIDINE GLUCONATE 4 % EX LIQD
60.0000 mL | Freq: Once | CUTANEOUS | Status: AC
  Administered 2021-12-29: 4 via TOPICAL
  Filled 2021-12-29: qty 60

## 2021-12-29 MED ORDER — HYDRALAZINE HCL 20 MG/ML IJ SOLN
INTRAMUSCULAR | Status: DC | PRN
  Administered 2021-12-29 (×3): 5 mg via INTRAVENOUS

## 2021-12-29 MED ORDER — SODIUM CHLORIDE 0.9 % IV SOLN
INTRAVENOUS | Status: DC | PRN
  Administered 2021-12-29: 80 mg

## 2021-12-29 MED ORDER — HEPARIN 6000 UNIT IRRIGATION SOLUTION
Status: DC | PRN
  Administered 2021-12-29: 1

## 2021-12-29 MED ORDER — ROCURONIUM BROMIDE 10 MG/ML (PF) SYRINGE
PREFILLED_SYRINGE | INTRAVENOUS | Status: AC
  Filled 2021-12-29: qty 10

## 2021-12-29 MED ORDER — SODIUM CHLORIDE 0.9 % IV SOLN
INTRAVENOUS | Status: AC
  Filled 2021-12-29 (×2): qty 2

## 2021-12-29 MED ORDER — INSULIN ASPART 100 UNIT/ML IJ SOLN: 0.0000 [IU] | INTRAMUSCULAR | Status: DC | PRN

## 2021-12-29 MED ORDER — LIDOCAINE 2% (20 MG/ML) 5 ML SYRINGE
INTRAMUSCULAR | Status: AC
  Filled 2021-12-29: qty 5

## 2021-12-29 MED ORDER — ACETAMINOPHEN 160 MG/5ML PO SOLN: 1000.0000 mg | Freq: Once | ORAL | Status: DC | PRN

## 2021-12-29 MED ORDER — MUPIROCIN 2 % EX OINT
TOPICAL_OINTMENT | Freq: Two times a day (BID) | CUTANEOUS | Status: DC
  Administered 2022-01-01: 1 via NASAL
  Filled 2021-12-29 (×2): qty 22

## 2021-12-29 MED ORDER — LIDOCAINE 2% (20 MG/ML) 5 ML SYRINGE
INTRAMUSCULAR | Status: DC | PRN
  Administered 2021-12-29: 60 mg via INTRAVENOUS

## 2021-12-29 MED ORDER — ACETAMINOPHEN 500 MG PO TABS: 1000.0000 mg | ORAL_TABLET | Freq: Once | ORAL | Status: DC | PRN

## 2021-12-29 MED ORDER — FENTANYL CITRATE (PF) 100 MCG/2ML IJ SOLN: 25.0000 ug | INTRAMUSCULAR | Status: DC | PRN

## 2021-12-29 MED ORDER — LACTATED RINGERS IV SOLN: INTRAVENOUS | Status: DC | PRN

## 2021-12-29 MED ORDER — CHLORHEXIDINE GLUCONATE CLOTH 2 % EX PADS
6.0000 | MEDICATED_PAD | Freq: Every day | CUTANEOUS | Status: DC
  Administered 2021-12-29 – 2021-12-31 (×3): 6 via TOPICAL

## 2021-12-29 MED ORDER — ROCURONIUM BROMIDE 10 MG/ML (PF) SYRINGE
PREFILLED_SYRINGE | INTRAVENOUS | Status: DC | PRN
  Administered 2021-12-29: 80 mg via INTRAVENOUS

## 2021-12-29 MED ORDER — CHLORHEXIDINE GLUCONATE 0.12 % MT SOLN
15.0000 mL | Freq: Once | OROMUCOSAL | Status: AC
  Administered 2021-12-29: 15 mL via OROMUCOSAL
  Filled 2021-12-29: qty 15

## 2021-12-29 MED ORDER — ORAL CARE MOUTH RINSE: 15.0000 mL | Freq: Once | OROMUCOSAL | Status: AC

## 2021-12-29 MED ORDER — VANCOMYCIN HCL IN DEXTROSE 1-5 GM/200ML-% IV SOLN
1000.0000 mg | INTRAVENOUS | Status: AC
  Administered 2021-12-29: 1000 mg via INTRAVENOUS
  Filled 2021-12-29: qty 200

## 2021-12-29 MED ORDER — CHLORHEXIDINE GLUCONATE 4 % EX LIQD: 60.0000 mL | Freq: Once | CUTANEOUS | Status: DC

## 2021-12-29 MED ORDER — LACTATED RINGERS IV SOLN: INTRAVENOUS | Status: DC

## 2021-12-29 MED ORDER — ONDANSETRON HCL 4 MG/2ML IJ SOLN
INTRAMUSCULAR | Status: DC | PRN
  Administered 2021-12-29: 4 mg via INTRAVENOUS

## 2021-12-29 SURGICAL SUPPLY — 43 items
BAG BANDED W/RUBBER/TAPE 36X54 (MISCELLANEOUS) ×2 IMPLANT
BAG COUNTER SPONGE SURGICOUNT (BAG) ×2 IMPLANT
BIOPATCH RED 1 DISK 7.0 (GAUZE/BANDAGES/DRESSINGS) ×1 IMPLANT
BLADE OSCILLATING /SAGITTAL (BLADE) IMPLANT
BLADE STERNUM SYSTEM 6 (BLADE) ×2 IMPLANT
BLADE SURG 10 STRL SS (BLADE) ×1 IMPLANT
BNDG COHESIVE 4X5 WHT NS (GAUZE/BANDAGES/DRESSINGS) IMPLANT
CANISTER SUCT 3000ML PPV (MISCELLANEOUS) ×2 IMPLANT
COIL ONE TIE COMPRESSION (VASCULAR PRODUCTS) ×1 IMPLANT
COVER BACK TABLE 60X90IN (DRAPES) ×2 IMPLANT
COVER SURGICAL LIGHT HANDLE (MISCELLANEOUS) ×1 IMPLANT
DRAPE CARDIOVASCULAR INCISE (DRAPES) ×1
DRAPE SRG 135X102X78XABS (DRAPES) ×1 IMPLANT
DRSG OPSITE 6X11 MED (GAUZE/BANDAGES/DRESSINGS) IMPLANT
DRSG TEGADERM 4X10 (GAUZE/BANDAGES/DRESSINGS) ×1 IMPLANT
ELECT REM PT RETURN 9FT ADLT (ELECTROSURGICAL) ×4
ELECTRODE REM PT RTRN 9FT ADLT (ELECTROSURGICAL) ×2 IMPLANT
GAUZE 4X4 16PLY ~~LOC~~+RFID DBL (SPONGE) ×2 IMPLANT
GAUZE SPONGE 4X4 12PLY STRL (GAUZE/BANDAGES/DRESSINGS) IMPLANT
GLOVE BIOGEL PI IND STRL 7.5 (GLOVE) ×1 IMPLANT
GLOVE BIOGEL PI INDICATOR 7.5 (GLOVE) ×1
GLOVE ECLIPSE 8.0 STRL XLNG CF (GLOVE) ×3 IMPLANT
GOWN STRL REUS W/ TWL LRG LVL3 (GOWN DISPOSABLE) IMPLANT
GOWN STRL REUS W/ TWL XL LVL3 (GOWN DISPOSABLE) ×1 IMPLANT
GOWN STRL REUS W/TWL LRG LVL3 (GOWN DISPOSABLE) ×3
GOWN STRL REUS W/TWL XL LVL3 (GOWN DISPOSABLE) ×1
KIT TURNOVER KIT B (KITS) ×2 IMPLANT
KIT WRENCH (KITS) ×1 IMPLANT
LEAD CAPSURE NOVUS 5076-58CM (Lead) ×1 IMPLANT
NDL SPNL 18GX3.5 QUINCKE PK (NEEDLE) IMPLANT
NEEDLE SPNL 18GX3.5 QUINCKE PK (NEEDLE) ×2 IMPLANT
PAD ARMBOARD 7.5X6 YLW CONV (MISCELLANEOUS) ×4 IMPLANT
PAD ELECT DEFIB RADIOL ZOLL (MISCELLANEOUS) ×2 IMPLANT
SHEATH 11 SUB-C ROTATE DILATOR (SHEATH) ×1 IMPLANT
SHEATH PINNACLE 6F 10CM (SHEATH) ×1 IMPLANT
STYLET LIBERATOR LOCKING (MISCELLANEOUS) ×1 IMPLANT
SUT PROLENE 2 0 SH DA (SUTURE) IMPLANT
Stylet Kit 55cm LAWSON REQUESTED ×1 IMPLANT
TOWEL GREEN STERILE (TOWEL DISPOSABLE) ×3 IMPLANT
TOWEL GREEN STERILE FF (TOWEL DISPOSABLE) ×3 IMPLANT
TRAY FOLEY SLVR 16FR TEMP STAT (SET/KITS/TRAYS/PACK) ×2 IMPLANT
TUBE CONNECTING 12X1/4 (SUCTIONS) ×1 IMPLANT
YANKAUER SUCT BULB TIP NO VENT (SUCTIONS) ×1 IMPLANT

## 2021-12-29 NOTE — Assessment & Plan Note (Addendum)
P/w fever, leukocytosis, respiratory rate 38 and acute metabolic encephalopathy.  Found to have enterococcus bacteremia. ? ?Subsequent TEE showed vegetation on PPM lead. ? ?- Continue ampicillin and Rocephin, 4 weeks from PPM extraction, end date 5/21 ?- Consult ID, appreciate cares ? ?

## 2021-12-29 NOTE — Anesthesia Procedure Notes (Signed)
Arterial Line Insertion ?Start/End4/23/2023 10:46 AM, 12/29/2021 10:46 AM ?Performed by: CRNA ? Patient location: Pre-op. ?Preanesthetic checklist: patient identified, IV checked, site marked, risks and benefits discussed, surgical consent, monitors and equipment checked, pre-op evaluation, timeout performed and anesthesia consent ?Lidocaine 1% used for infiltration ?Right, radial was placed ?Catheter size: 20 G ?Hand hygiene performed , maximum sterile barriers used  and Seldinger technique used ?Allen's test indicative of satisfactory collateral circulation ?Attempts: 1 ?Procedure performed without using ultrasound guided technique. ?Following insertion, dressing applied and Biopatch. ?Post procedure assessment: normal ? ?Patient tolerated the procedure well with no immediate complications. ? ? ?

## 2021-12-29 NOTE — Assessment & Plan Note (Addendum)
-  Continue losartan, atorvastatin ?- Stop aspirin now that Eliquis will be started ?

## 2021-12-29 NOTE — Op Note (Signed)
EP Procedure Note ? ?Procedure performed: DDD PM system extraction and insertion of a temp/perm single chamber pacemaker ? 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 ?Postoperative diagnosis: same as preoperative diagnosis ? ?Description of the procedure: After informed consent was obtained, the patient was taken to the operating room in the fasting state.  The anesthesia service was utilized to provide general endotracheal anesthesia.  Invasive arterial hemodynamic monitoring was carried out with a catheter in the right radial artery performed by the anesthesia service.  After the preparation and draping, attention was turned to the right femoral vein where a 7 French sheath was inserted.  This was for large-bore access. ? ?Next attention was turned to placement of a temporary permanent transvenous pacemaker.  The left internal jugular vein was punctured without difficulty.  The Medtronic model 507 658 cm active-fixation pacing lead, serial number [id number], was advanced under fluoroscopic guidance into the right ventricle.  The lead was placed in the RV apical septum.  The lead was actively fixed.  R waves were initially 15 but subsequently settled out at 4.  The pacing impedance was 700 ohms and the threshold was 0.5 V at 0.4 ms.  The lead was secured to the skin with silk suture.  The sewing sleeve was secured with silk suture.  At this point attention was turned to removal of the dual-chamber pacing system. ? ?A 5 cm incision was carried out over the left infraclavicular region.  Electrocautery was utilized to dissect down to the pacemaker generator which was removed with gentle traction.  The leads were freed up from the dense fibrous scar tissue with electrocautery.  The sewing sleeve was freed up from the fascia with electrocautery and sharp dissection.  A stylette was inserted into the body of the atrial lead (Medtronic 5076) and the helix retracted.  Gentle traction was  placed on the lead and the atrial lead was removed in total.  The stylette was inserted into the body of the ventricular lead which was a model 4074 passive Medtronic lead and gentle traction placed on the lead but it remained stuck in place.  The stylette was removed and the lead was cut.  A Cook liberator locking stylette was then inserted into the body of the lead.  The [place] 1 tie proximal suture was placed on the proximal portion of the lead and locked in place.  The [employer] 73 [place] sub-C short extraction sheath was then advanced over the pacing lead and with a combination of traction and countertraction, pressure and counterpressure placed on the lead and the extraction sheath, the ventricular lead was freed up from its scar tissue and removed with gentle traction.  There is no hemodynamic sequelae.  Hemostasis was achieved with firm pressure placed on the pocket.  A Vicryl suture was placed at the vein entry site to help assure hemostasis.  Electrocautery was utilized to free up the dense fibrous scar tissue from the pacemaker capsule.  The pocket was irrigated with antibiotic irrigation.  The incision was closed with 2 layers of Vicryl suture.  The 7 French sheath placed in the right femoral vein was then removed and pressure was held and hemostasis was assured.  The patient was then returned to the recovery area in stable condition.  The patient's previously implanted dual-chamber pacemaker was connected to the active-fixation temp perm ventricular lead and secured to the skin with silk suture. ? ?Complications: There were no immediate procedure complications ? ?Conclusion: Successful extraction  of a Medtronic dual-chamber pacemaker which had been in place for 4 years with successful insertion of a Medtronic temp perm single-chamber pacing lead connected to the previously implanted dual-chamber pacemaker generator. ? ?[name], MD ?

## 2021-12-29 NOTE — Assessment & Plan Note (Addendum)
-  Continue losartan, furosemide, doxazosin, amlodipine ?

## 2021-12-29 NOTE — Interval H&P Note (Signed)
History and Physical Interval Note: ? ?12/29/2021 ?11:31 AM ? ?Jason Jarvis  has presented today for surgery, with the diagnosis of Lead extraction.  The various methods of treatment have been discussed with the patient and family. After consideration of risks, benefits and other options for treatment, the patient has consented to  Procedure(s): ?PACEMAKER LEAD REMOVAL (N/A) as a surgical intervention.  The patient's history has been reviewed, patient examined, no change in status, stable for surgery.  I have reviewed the patient's chart and labs.  Questions were answered to the patient's satisfaction.   ? ? ?[name] ? ? ?

## 2021-12-29 NOTE — Progress Notes (Signed)
Patient transferred from PACU at 1417hrs.  Sleepy but arousable.  Dressing to left chest CDI.  Reviewed post procedure instructions, patient verbalized understanding.  ?

## 2021-12-29 NOTE — Assessment & Plan Note (Addendum)
Na resolved with treatment of sepsis.  ?

## 2021-12-29 NOTE — H&P (View-Only) (Signed)
? ?Progress Note ? ?Patient Name: Jason Jarvis ?Date of Encounter: 12/29/2021 ? ?Primary Cardiologist: None  ? ?Subjective  ? ?No chest pain or sob. No fever or chills. ? ?Inpatient Medications  ?  ?Scheduled Meds: ? amLODipine  2.5 mg Oral Daily  ? aspirin EC  81 mg Oral Daily  ? atorvastatin  40 mg Oral Daily  ? chlorhexidine  60 mL Topical Once  ? doxazosin  2 mg Oral QHS  ? enoxaparin (LOVENOX) injection  1 mg/kg Subcutaneous Q12H  ? furosemide  40 mg Oral Daily  ? gentamicin irrigation  80 mg Irrigation To SSTC  ? insulin aspart  0-5 Units Subcutaneous QHS  ? insulin aspart  0-9 Units Subcutaneous TID WC  ? losartan  50 mg Oral Daily  ? mupirocin ointment   Nasal BID  ? pantoprazole  40 mg Oral BID  ? potassium chloride  10 mEq Oral Daily  ? senna-docusate  2 tablet Oral Daily  ? ?Continuous Infusions: ? sodium chloride Stopped (12/27/21 0550)  ? sodium chloride 50 mL/hr at 12/29/21 1941  ? ampicillin (OMNIPEN) IV 2 g (12/29/21 0754)  ? cefTRIAXone (ROCEPHIN)  IV 2 g (12/28/21 2316)  ? vancomycin    ? ?PRN Meds: ?sodium chloride, acetaminophen, melatonin, nitroGLYCERIN, ondansetron (ZOFRAN) IV, oxyCODONE, polyethylene glycol, traMADol  ? ?Vital Signs  ?  ?Vitals:  ? 12/28/21 2134 12/29/21 0124 12/29/21 0631 12/29/21 0746  ?BP: (!) 155/85 (!) 170/79 (!) 161/88 (!) 166/80  ?Pulse: 60 (!) 59 62 61  ?Resp: '19 20 17 17  '$ ?Temp: 98.1 ?F (36.7 ?C) 99 ?F (37.2 ?C) 98.7 ?F (37.1 ?C) 98.6 ?F (37 ?C)  ?TempSrc: Oral Oral Oral Oral  ?SpO2: 95% 98% 95% 97%  ?Weight:   86.3 kg   ?Height:      ? ? ?Intake/Output Summary (Last 24 hours) at 12/29/2021 0909 ?Last data filed at 12/29/2021 0636 ?Gross per 24 hour  ?Intake 420 ml  ?Output 1450 ml  ?Net -1030 ml  ? ?Filed Weights  ? 12/27/21 0500 12/28/21 0500 12/29/21 0631  ?Weight: 88.1 kg 87.1 kg 86.3 kg  ? ? ?Telemetry  ?  ?AV pacing - Personally Reviewed ? ?ECG  ?  ?none - Personally Reviewed ? ?Physical Exam  ? ?GEN: No acute distress.   ?Neck: No JVD ?Cardiac: RRR, no murmurs,  rubs, or gallops.  ?Respiratory: Clear to auscultation bilaterally. ?GI: Soft, nontender, non-distended  ?MS: No edema; No deformity. ?Neuro:  Nonfocal  ?Psych: Normal affect  ? ?Labs  ?  ?Chemistry ?Recent Labs  ?Lab 12/25/21 ?0407 12/28/21 ?0322 12/29/21 ?7408  ?NA 138 139 139  ?K 4.6 4.2 4.4  ?CL 107 109 106  ?CO2 '24 25 25  '$ ?GLUCOSE 118* 114* 135*  ?BUN '21 22 17  '$ ?CREATININE 1.09 1.03 1.10  ?CALCIUM 8.8* 8.7* 8.9  ?PROT  --  6.1* 5.7*  ?ALBUMIN  --  3.2* 2.8*  ?AST  --  24 24  ?ALT  --  38 36  ?ALKPHOS  --  83 78  ?BILITOT  --  0.5 0.5  ?GFRNONAA >60 >60 >60  ?ANIONGAP '7 5 8  '$ ?  ? ?Hematology ?Recent Labs  ?Lab 12/25/21 ?0407 12/28/21 ?0322 12/29/21 ?1448  ?WBC 5.9 7.0 7.3  ?RBC 4.50 4.45 4.45  ?HGB 12.0* 12.1* 11.6*  ?HCT 38.0* 38.1* 37.6*  ?MCV 84.4 85.6 84.5  ?MCH 26.7 27.2 26.1  ?MCHC 31.6 31.8 30.9  ?RDW 14.8 14.6 14.6  ?PLT 160 173 181  ? ? ?  Cardiac EnzymesNo results for input(s): TROPONINI in the last 168 hours. No results for input(s): TROPIPOC in the last 168 hours.  ? ?BNPNo results for input(s): BNP, PROBNP in the last 168 hours.  ? ?DDimer  ?Recent Labs  ?Lab 12/22/21 ?1419  ?DDIMER 1.00*  ?  ? ?Radiology  ?  ?[place] EKG SITE RITE ? ?Result Date: 12/28/2021 ?If [employer] not attached, placement could not be confirmed due to current cardiac rhythm.  ? ?Cardiac Studies  ? ?TEE noted ? ?Patient Profile  ?   ?71 y.o. 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 ?Assessment & Plan  ?  ?PM endocarditis - he will undergo extraction later today. ?Bradycardia - he was paced. We will see if he is device dependent and if so place a temp perm PM at the time of his extraction. ?   ? ?For questions or updates, please contact [place] ?Please consult [email or web address] for contact info under Cardiology/STEMI. ?  ?   ?Signed, ?[name], MD  ?12/29/2021, 9:09 AM    ?

## 2021-12-29 NOTE — Assessment & Plan Note (Addendum)
At baseline, patient independent,has no cognitive impairment, works 3 jobs.   ?On admission, patient was alert to self and place only, sleepy and disoriented to situation. ? ?This is now resolved with treatment of sepsis. ?

## 2021-12-29 NOTE — Progress Notes (Signed)
?  Progress Note ? ? ?[name] HQP:[id number] DOB: 1951-07-25 DOA: 12/21/2021     7 ?DOS: the patient was seen and examined on 12/29/2021   ?  ? ? ? ?Brief hospital course: ?Jason Jarvis is a 71 y.o. M with hx CAD, pAF not on AC and bradycardia s/p PPM c/b MRSA bacteremia in 2019 with PPM removal then later reimplantation, hx COPD, CKD 2, seizures no longer on AED, dCHF, R renal cell carcinoma s/p cryoablation 2017, left knee replacement who presented with AMS, scrotal pain, flank pain and fever.   ? ?Patient works night shift and came home morning of admission, looked sick, wife noticed somewhat confused, warm, complaining of scrotal pain, flank pain which he told her at that time had been present a few days.  ? ?Sent to the ER where UA negative, CT head normal, CT abdomen/pelvis unremarkable, CXR clear but temp 102.10F, RR 38 and WBC 15.1K, and confused ? ? ?4/15: started on [place] and admitted for sepsis ?4/16: US scrotum remarkable for microcalcifications on R testicle without abscess, fluid or sign of infection --> blood cultures positive for enterococcus; CT left foot normal; [place] LE shows age indeterminate DVT in left posterior tib; ID consulted, switched to ampicillin ?4/17: TTE with EF 50%, grade I DD ?4/20: TEE shows vegetation on pacer lead ? ? ? ? ? ?Assessment and Plan: ?* Severe sepsis ([place]) ?-Continue ampicillin and Rocephin ?- Consult ID, appreciate cares ?- Follow 4/19 blood cultures, NGTD ? ? ?Pacemaker infection ([place]) ?- Consult EP, appreciate cares ? ?Bacteremia due to Enterococcus ?  ? ?Acute on chronic systolic CHF (congestive heart failure) ([place]) ?-Continue furosemide PO ? ? ?Deep venous thrombosis of right tibial vein ([place]) ?-Continue Lovenox ? ?Acute metabolic encephalopathy ?Resolved ? ?Hyponatremia ?  ? ?Type 2 diabetes mellitus ([place]) ?A1C 6.5% ?-Continue SS corrections ? ?Coronary artery disease ?-Continue losartan, atorvastatin, aspirin ? ?Scrotal pain ?No evidence of cystitis,  epididymitis on exam/labwork. ?US Scrotum with microcalcification on R scrotum, unsure significance in setting of RCC ? ?Atrial fibrillation, chronic ([place]) ?In sinus ?PPM removed today ? ?Essential hypertension ?-Continue losartan, furosemide, doxazosin ? ?  ? ? ? ? ? ? ? ? ?Subjective: Feels tired after his pacemaker extraction, no fever, vomiting. ? ? ? ? ?Physical Exam: ?Vitals:  ? 12/29/21 1432 12/29/21 1447 12/29/21 1500 12/29/21 1532  ?BP: (!) 133/55 139/60 135/61 (!) 131/59  ?Pulse: 64 67 62 61  ?Resp: '19 18 20 20  '$ ?Temp:      ?TempSrc:      ?SpO2: 94% 94% 96% 96%  ?Weight:      ?Height:      ? ?Elderly adult male, lying in bed, central line in the left neck, responsive and interactive ?RRR, soft systolic murmur, no peripheral edema ?Lung sounds clear without rales or wheezes ?Abdomen soft no tenderness palpation or guarding ?Attention normal, affect blunted due to sedation from recent anesthesia, face symmetric, speech fluent ? ?Data Reviewed: ?Cardiology and infectious disease notes reviewed, nursing notes reviewed, vital signs reviewed ?Complete metabolic panel normal ?Complete blood count is normal ?Blood culture from 419 no growth today ?Glucose is excellent ? ?Family Communication: Wife at the bedside ? ? ? ?Disposition: ?Status is: Inpatient ? ? ? ? ? ? ? ? ?Author: ?Edwin Dada, MD ?12/29/2021 3:54 PM ? ?For on call review [email or web address].  ? ? ?

## 2021-12-29 NOTE — Assessment & Plan Note (Addendum)
S/p PPM extraction 4/23 by Dr. Lovena Le ?Pacemaker pocket hematoma ?PPM removed day before yesterday, transvenous pacer dropped, no pacing needed with transvenous pacer over 24 hours, so removed yesterday.   ? ?Given now second episode of PPM infection, for now, plan will be not to reimplant. ?- Outpatient EP follow up on Friday for wound check, hematoma check ?- Avoid nodal blocking agents ? ?- Stop home metoprolol ? ?- Hold Lovenox/anticoaguation 5 days from PPM removal (risk for hematoma) ?- Keep compression dressing in place until Friday appt with EP ? ?

## 2021-12-29 NOTE — Assessment & Plan Note (Signed)
A1C 6.5% ?-Continue SS corrections ?

## 2021-12-29 NOTE — Transfer of Care (Signed)
Immediate Anesthesia Transfer of Care Note ? ?Patient: Jason Jarvis ? ?Procedure(s) Performed: PACEMAKER LEAD REMOVAL (Chest) ? ?Patient Location: PACU ? ?Anesthesia Type:General ? ?Level of Consciousness: awake and patient cooperative ? ?Airway & Oxygen Therapy: Patient Spontanous Breathing and Patient connected to face mask oxygen ? ?Post-op Assessment: Report given to RN and Post -op Vital signs reviewed and stable ? ?Post vital signs: Reviewed and stable ? ?Last Vitals:  ?Vitals Value Taken Time  ?BP 142/57 12/29/21 1310  ?Temp    ?Pulse 71 12/29/21 1315  ?Resp 20 12/29/21 1315  ?SpO2 95 % 12/29/21 1315  ?Vitals shown include unvalidated device data. ? ?Last Pain:  ?Vitals:  ? 12/29/21 0932  ?TempSrc: Oral  ?PainSc:   ?   ? ?Patients Stated Pain Goal: 0 (12/28/21 1706) ? ?Complications: No notable events documented. ?

## 2021-12-29 NOTE — Assessment & Plan Note (Signed)
No evidence of cystitis, epididymitis on exam/labwork. ?US Scrotum with microcalcification on R scrotum, unsure significance in setting of RCC ?

## 2021-12-29 NOTE — Assessment & Plan Note (Addendum)
Acute CHF ruled out. ?-Continue furosemide PO ? ?

## 2021-12-29 NOTE — Assessment & Plan Note (Addendum)
Age indeterminate DVT seen on [place], incidental finding.  Regardless, will be on Eliquis indefinitely for Afib. ?Was on enoxaparin here therapeutically until PPM extraction.  ? ?- Hold Lovenox and Eliquis for now given risk of PPM pocket hematoma ?- EP will plan to start Eliquis for Afib as an outpatient ?

## 2021-12-29 NOTE — Assessment & Plan Note (Addendum)
Remains in sinus bradycardia after PPM removal. ?Discussed with EP.  This is in his prior chart without mention of AC at last Cardiology office visit.   ? ?PPM interrogated for single afib episode on 4/14 of 5 hours duration (day prior to his admission, when he was septic) with a total burden of 0.6% ? ?CHA2DS2-Vasc 5 (age, CHF, DM, HTN, vascular disease) ?- Stop Lovenox as above ?- Plan for Eliquis in 3 days as above ?- Hold nodal blockade given bradycardia ? ? ?

## 2021-12-29 NOTE — Progress Notes (Signed)
Patient taken to Short  stay at 0920hrs with RN.  ?

## 2021-12-29 NOTE — Anesthesia Procedure Notes (Signed)
Procedure Name: Intubation ?Date/Time: 12/29/2021 11:18 AM ?Performed by: Renato Shin, CRNA ?Pre-anesthesia Checklist: Patient identified, Emergency Drugs available, Suction available and Patient being monitored ?Patient Re-evaluated:Patient Re-evaluated prior to induction ?Oxygen Delivery Method: Circle system utilized ?Preoxygenation: Pre-oxygenation with 100% oxygen ?Induction Type: IV induction ?Ventilation: Oral airway inserted - appropriate to patient size and Mask ventilation with difficulty ?Laryngoscope Size: Sabra Heck and 3 ?Grade View: Grade I ?Tube type: Oral ?Tube size: 7.5 mm ?Number of attempts: 1 ?Airway Equipment and Method: Stylet and Oral airway ?Placement Confirmation: ETT inserted through vocal cords under direct vision, positive ETCO2 and breath sounds checked- equal and bilateral ?Secured at: 21 cm ?Tube secured with: Tape ?Dental Injury: Teeth and Oropharynx as per pre-operative assessment  ? ? ? ? ?

## 2021-12-29 NOTE — Anesthesia Preprocedure Evaluation (Signed)
Anesthesia Evaluation  ?Patient identified by MRN, date of birth, ID band ?Patient awake ? ? ? ?Reviewed: ?Allergy & Precautions, NPO status , Patient's Chart, lab work & pertinent test results, reviewed documented beta blocker date and time  ? ?History of Anesthesia Complications ?Negative for: history of anesthetic complications ? ?Airway ?Mallampati: II ? ?TM Distance: >3 FB ?Neck ROM: Full ? ? ? Dental ? ?(+) Edentulous Upper, Edentulous Lower, Dental Advisory Given ?  ?Pulmonary ?neg pulmonary ROS,  ?  ?breath sounds clear to auscultation ? ? ? ? ? ? Cardiovascular ?hypertension, Pt. on home beta blockers and Pt. on medications ?+ CAD, + Past MI and +CHF  ?+ pacemaker  ?Rhythm:Regular  ? ?'23 TTE - EF 50%. Global hypokinesis with septal-lateral dyssynchrony due to RV pacing. There is mild left ventricular hypertrophy. Grade I diastolic dysfunction (impaired relaxation). No valvulopathy. ? ? ?  ?Neuro/Psych ?Seizures - (off meds), Well Controlled,   Neuromuscular disease CVA, Residual Symptoms negative psych ROS  ? GI/Hepatic ?negative GI ROS, Neg liver ROS,   ?Endo/Other  ?diabetes, Insulin Dependent ? Renal/GU ?CRFRenal disease ?Hx RCC ?  ? ?  ?Musculoskeletal ?negative musculoskeletal ROS ?(+)  ? Abdominal ?  ?Peds ? Hematology ? ?(+) Blood dyscrasia, anemia ,   ?Anesthesia Other Findings ? ? Reproductive/Obstetrics ? ?  ? ? ? ? ? ? ? ? ? ? ? ? ? ?  ?  ? ? ? ? ? ? ? ? ?Anesthesia Physical ?Anesthesia Plan ? ?ASA: 3 ? ?Anesthesia Plan: General  ? ?Post-op Pain Management: Minimal or no pain anticipated  ? ?Induction: Intravenous ? ?PONV Risk Score and Plan: 2 and Ondansetron and Dexamethasone ? ?Airway Management Planned: Oral ETT ? ?Additional Equipment: Arterial line and TEE ? ?Intra-op Plan:  ? ?Post-operative Plan: Extubation in OR ? ?Informed Consent: I have reviewed the patients History and Physical, chart, labs and discussed the procedure including the risks, benefits  and alternatives for the proposed anesthesia with the patient or authorized representative who has indicated his/her understanding and acceptance.  ? ? ? ?Dental advisory given ? ?Plan Discussed with: CRNA ? ?Anesthesia Plan Comments: (TEE for monitoring only)  ? ? ? ? ? ? ?Anesthesia Quick Evaluation ? ?

## 2021-12-29 NOTE — Progress Notes (Signed)
? ?Progress Note ? ?Patient Name: Jason Jarvis ?Date of Encounter: 12/29/2021 ? ?Primary Cardiologist: None  ? ?Subjective  ? ?No chest pain or sob. No fever or chills. ? ?Inpatient Medications  ?  ?Scheduled Meds: ? amLODipine  2.5 mg Oral Daily  ? aspirin EC  81 mg Oral Daily  ? atorvastatin  40 mg Oral Daily  ? chlorhexidine  60 mL Topical Once  ? doxazosin  2 mg Oral QHS  ? enoxaparin (LOVENOX) injection  1 mg/kg Subcutaneous Q12H  ? furosemide  40 mg Oral Daily  ? gentamicin irrigation  80 mg Irrigation To SSTC  ? insulin aspart  0-5 Units Subcutaneous QHS  ? insulin aspart  0-9 Units Subcutaneous TID WC  ? losartan  50 mg Oral Daily  ? mupirocin ointment   Nasal BID  ? pantoprazole  40 mg Oral BID  ? potassium chloride  10 mEq Oral Daily  ? senna-docusate  2 tablet Oral Daily  ? ?Continuous Infusions: ? sodium chloride Stopped (12/27/21 0550)  ? sodium chloride 50 mL/hr at 12/29/21 2449  ? ampicillin (OMNIPEN) IV 2 g (12/29/21 0754)  ? cefTRIAXone (ROCEPHIN)  IV 2 g (12/28/21 2316)  ? vancomycin    ? ?PRN Meds: ?sodium chloride, acetaminophen, melatonin, nitroGLYCERIN, ondansetron (ZOFRAN) IV, oxyCODONE, polyethylene glycol, traMADol  ? ?Vital Signs  ?  ?Vitals:  ? 12/28/21 2134 12/29/21 0124 12/29/21 0631 12/29/21 0746  ?BP: (!) 155/85 (!) 170/79 (!) 161/88 (!) 166/80  ?Pulse: 60 (!) 59 62 61  ?Resp: '19 20 17 17  '$ ?Temp: 98.1 ?F (36.7 ?C) 99 ?F (37.2 ?C) 98.7 ?F (37.1 ?C) 98.6 ?F (37 ?C)  ?TempSrc: Oral Oral Oral Oral  ?SpO2: 95% 98% 95% 97%  ?Weight:   86.3 kg   ?Height:      ? ? ?Intake/Output Summary (Last 24 hours) at 12/29/2021 0909 ?Last data filed at 12/29/2021 0636 ?Gross per 24 hour  ?Intake 420 ml  ?Output 1450 ml  ?Net -1030 ml  ? ?Filed Weights  ? 12/27/21 0500 12/28/21 0500 12/29/21 0631  ?Weight: 88.1 kg 87.1 kg 86.3 kg  ? ? ?Telemetry  ?  ?AV pacing - Personally Reviewed ? ?ECG  ?  ?none - Personally Reviewed ? ?Physical Exam  ? ?GEN: No acute distress.   ?Neck: No JVD ?Cardiac: RRR, no murmurs,  rubs, or gallops.  ?Respiratory: Clear to auscultation bilaterally. ?GI: Soft, nontender, non-distended  ?MS: No edema; No deformity. ?Neuro:  Nonfocal  ?Psych: Normal affect  ? ?Labs  ?  ?Chemistry ?Recent Labs  ?Lab 12/25/21 ?0407 12/28/21 ?0322 12/29/21 ?7530  ?NA 138 139 139  ?K 4.6 4.2 4.4  ?CL 107 109 106  ?CO2 '24 25 25  '$ ?GLUCOSE 118* 114* 135*  ?BUN '21 22 17  '$ ?CREATININE 1.09 1.03 1.10  ?CALCIUM 8.8* 8.7* 8.9  ?PROT  --  6.1* 5.7*  ?ALBUMIN  --  3.2* 2.8*  ?AST  --  24 24  ?ALT  --  38 36  ?ALKPHOS  --  83 78  ?BILITOT  --  0.5 0.5  ?GFRNONAA >60 >60 >60  ?ANIONGAP '7 5 8  '$ ?  ? ?Hematology ?Recent Labs  ?Lab 12/25/21 ?0407 12/28/21 ?0322 12/29/21 ?0511  ?WBC 5.9 7.0 7.3  ?RBC 4.50 4.45 4.45  ?HGB 12.0* 12.1* 11.6*  ?HCT 38.0* 38.1* 37.6*  ?MCV 84.4 85.6 84.5  ?MCH 26.7 27.2 26.1  ?MCHC 31.6 31.8 30.9  ?RDW 14.8 14.6 14.6  ?PLT 160 173 181  ? ? ?  Cardiac EnzymesNo results for input(s): TROPONINI in the last 168 hours. No results for input(s): TROPIPOC in the last 168 hours.  ? ?BNPNo results for input(s): BNP, PROBNP in the last 168 hours.  ? ?DDimer  ?Recent Labs  ?Lab 12/22/21 ?1419  ?DDIMER 1.00*  ?  ? ?Radiology  ?  ?[place] EKG SITE RITE ? ?Result Date: 12/28/2021 ?If [employer] not attached, placement could not be confirmed due to current cardiac rhythm.  ? ?Cardiac Studies  ? ?TEE noted ? ?Patient Profile  ?   ?71 y.o. 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 ?Assessment & Plan  ?  ?PM endocarditis - he will undergo extraction later today. ?Bradycardia - he was paced. We will see if he is device dependent and if so place a temp perm PM at the time of his extraction. ?   ? ?For questions or updates, please contact [place] ?Please consult [email or web address] for contact info under Cardiology/STEMI. ?  ?   ?Signed, ?[name], MD  ?12/29/2021, 9:09 AM    ?

## 2021-12-30 ENCOUNTER — Encounter (HOSPITAL_COMMUNITY): Payer: Self-pay | Admitting: Internal Medicine

## 2021-12-30 ENCOUNTER — Inpatient Hospital Stay: Payer: Self-pay

## 2021-12-30 ENCOUNTER — Inpatient Hospital Stay (HOSPITAL_COMMUNITY): Payer: Medicare HMO

## 2021-12-30 ENCOUNTER — Other Ambulatory Visit (HOSPITAL_COMMUNITY): Payer: Self-pay

## 2021-12-30 DIAGNOSIS — I5022 Chronic systolic (congestive) heart failure: Secondary | ICD-10-CM

## 2021-12-30 DIAGNOSIS — A419 Sepsis, unspecified organism: Secondary | ICD-10-CM | POA: Diagnosis not present

## 2021-12-30 DIAGNOSIS — R4182 Altered mental status, unspecified: Secondary | ICD-10-CM | POA: Diagnosis not present

## 2021-12-30 DIAGNOSIS — I5023 Acute on chronic systolic (congestive) heart failure: Secondary | ICD-10-CM | POA: Diagnosis not present

## 2021-12-30 DIAGNOSIS — G9341 Metabolic encephalopathy: Secondary | ICD-10-CM | POA: Diagnosis not present

## 2021-12-30 LAB — CBC WITH DIFFERENTIAL/PLATELET
Abs Immature Granulocytes: 0.05 10*3/uL (ref 0.00–0.07)
Basophils Absolute: 0 10*3/uL (ref 0.0–0.1)
Basophils Relative: 0 %
Eosinophils Absolute: 0 10*3/uL (ref 0.0–0.5)
Eosinophils Relative: 0 %
HCT: 34.7 % — ABNORMAL LOW (ref 39.0–52.0)
Hemoglobin: 10.9 g/dL — ABNORMAL LOW (ref 13.0–17.0)
Immature Granulocytes: 1 %
Lymphocytes Relative: 16 %
Lymphs Abs: 1.4 10*3/uL (ref 0.7–4.0)
MCH: 26.1 pg (ref 26.0–34.0)
MCHC: 31.4 g/dL (ref 30.0–36.0)
MCV: 83.2 fL (ref 80.0–100.0)
Monocytes Absolute: 0.5 10*3/uL (ref 0.1–1.0)
Monocytes Relative: 5 %
Neutro Abs: 7.2 10*3/uL (ref 1.7–7.7)
Neutrophils Relative %: 78 %
Platelets: 188 10*3/uL (ref 150–400)
RBC: 4.17 MIL/uL — ABNORMAL LOW (ref 4.22–5.81)
RDW: 14.6 % (ref 11.5–15.5)
WBC: 9.2 10*3/uL (ref 4.0–10.5)
nRBC: 0 % (ref 0.0–0.2)

## 2021-12-30 LAB — PROTIME-INR
INR: 1.1 (ref 0.8–1.2)
Prothrombin Time: 13.7 seconds (ref 11.4–15.2)

## 2021-12-30 LAB — CULTURE, BLOOD (ROUTINE X 2)
Culture: NO GROWTH
Culture: NO GROWTH
Special Requests: ADEQUATE
Special Requests: ADEQUATE

## 2021-12-30 LAB — GLUCOSE, CAPILLARY
Glucose-Capillary: 109 mg/dL — ABNORMAL HIGH (ref 70–99)
Glucose-Capillary: 189 mg/dL — ABNORMAL HIGH (ref 70–99)
Glucose-Capillary: 150 mg/dL — ABNORMAL HIGH (ref 70–99)
Glucose-Capillary: 156 mg/dL — ABNORMAL HIGH (ref 70–99)

## 2021-12-30 LAB — COMPREHENSIVE METABOLIC PANEL
ALT: 31 U/L (ref 0–44)
AST: 22 U/L (ref 15–41)
Albumin: 2.8 g/dL — ABNORMAL LOW (ref 3.5–5.0)
Alkaline Phosphatase: 70 U/L (ref 38–126)
Anion gap: 7 (ref 5–15)
BUN: 16 mg/dL (ref 8–23)
CO2: 23 mmol/L (ref 22–32)
Calcium: 8.5 mg/dL — ABNORMAL LOW (ref 8.9–10.3)
Chloride: 108 mmol/L (ref 98–111)
Creatinine, Ser: 1.1 mg/dL (ref 0.61–1.24)
GFR, Estimated: >60 mL/min (ref >60)
Glucose, Bld: 115 mg/dL — ABNORMAL HIGH (ref 70–99)
Potassium: 4.3 mmol/L (ref 3.5–5.1)
Sodium: 138 mmol/L (ref 135–145)
Total Bilirubin: 0.7 mg/dL (ref 0.3–1.2)
Total Protein: 5.2 g/dL — ABNORMAL LOW (ref 6.5–8.1)

## 2021-12-30 MED ORDER — CEFTRIAXONE IV (FOR PTA / DISCHARGE USE ONLY): 2.0000 g | Freq: Two times a day (BID) | INTRAVENOUS | 0 refills | Status: DC

## 2021-12-30 MED ORDER — PANTOPRAZOLE 2 MG/ML SUSPENSION
40.0000 mg | Freq: Every day | ORAL | Status: DC
  Filled 2021-12-30 (×2): qty 20

## 2021-12-30 MED ORDER — POTASSIUM CHLORIDE CRYS ER 20 MEQ PO TBCR
10.0000 meq | EXTENDED_RELEASE_TABLET | Freq: Every day | ORAL | Status: DC
  Administered 2021-12-30 – 2022-01-01 (×3): 10 meq via ORAL
  Filled 2021-12-30 (×3): qty 1

## 2021-12-30 MED ORDER — ASPIRIN 81 MG PO CHEW
81.0000 mg | CHEWABLE_TABLET | Freq: Every day | ORAL | Status: DC
  Administered 2021-12-30 – 2021-12-31 (×2): 81 mg via ORAL
  Filled 2021-12-30 (×2): qty 1

## 2021-12-30 MED ORDER — AMPICILLIN IV (FOR PTA / DISCHARGE USE ONLY): 12.0000 g | INTRAVENOUS | 0 refills | Status: DC

## 2021-12-30 MED ORDER — POTASSIUM CHLORIDE CRYS ER 20 MEQ PO TBCR: 10.0000 meq | EXTENDED_RELEASE_TABLET | Freq: Every day | ORAL | Status: DC

## 2021-12-30 NOTE — Progress Notes (Signed)
Attempt to place PICC by Jule Economy, RN in right basilic vein.  Vein cannulated with ease and good blood return, guide wire and introducer inserted without resistance.  PICC was attempted to be inserted but despite multiple attempts and techniques, unable to get PICC to drop into SVC.  Patient's other vessels in right arm are too small >45% occupancy.  Patient with swelling in left chest area to almost midline.  Unable to attempt left side due to this swelling and recent removal of pacemaker.  Unsure if this is causing compression on right subclavian vein therefore plan to refer to Interventional radiology for further attempt. ?

## 2021-12-30 NOTE — Progress Notes (Addendum)
Physical Therapy Treatment ?Patient Details ?Name: Jason Jarvis ?MRN: [id number] ?DOB: 1951-02-12 ?Today's Date: 12/30/2021 ? ? ?History of Present Illness 71 y.o. male adm 4/15 for sepsis, bacteremia, scrotal pain, AMs. Pt with PPM endocarditis. 4/23 DDD PM system extraction and insertion of a temp/perm single chamber pacemaker. PMHx: COPD, CAD, remote sz, nephrolithiasis, chronic kidney cyst stage II, paroxysmal Afib, CHF, PPM, renal cell CA, left TKA ? ?  ?PT Comments  ? ? Pt pleasant and educated for precautions of bil UE s/p cath and pacemaker surgery. Pt able to move left elbow and wrist maintaining pacemaker precautions. Pt enjoys being independent and outside and grateful to be OOB and walking this date. Plan remains appropriate and will continue to follow. Pt with blood oozing at pacemaker site end of session with RN aware. ? ?HR 56-86 ?99% on RA ?   ?Recommendations for follow up therapy are one component of a multi-disciplinary discharge planning process, led by the attending physician.  Recommendations may be updated based on patient status, additional functional criteria and insurance authorization. ? ?Follow Up Recommendations ? No PT follow up ?  ?  ?Assistance Recommended at Discharge Intermittent Supervision/Assistance  ?Patient can return home with the following A little help with bathing/dressing/bathroom ?  ?Equipment Recommendations ? None recommended by PT  ?  ?Recommendations for Other Services   ? ? ?  ?Precautions / Restrictions Precautions ?Precautions: Fall;ICD/Pacemaker;Other (comment) ?Precaution Comments: RUE heart cath 4/23, pacemaker precautions LUE 4/23  ?  ? ?Mobility ? Bed Mobility ?Overal bed mobility: Needs Assistance ?Bed Mobility: Supine to Sit ?  ?  ?Supine to sit: Min assist, HOB elevated ?  ?  ?General bed mobility comments: HOB 30 degrees with min assist to elevate trunk due to restrictions with bil UE ?  ? ?Transfers ?Overall transfer level: Needs assistance ?  ?Transfers:  Sit to/from Stand ?Sit to Stand: Supervision ?  ?  ?  ?  ?  ?General transfer comment: supervision for lines and precautions with pt able to rise and sit without use of bil UE ?  ? ?Ambulation/Gait ?Ambulation/Gait assistance: Supervision ?Gait Distance (Feet): 350 Feet ?Assistive device: IV Pole ?Gait Pattern/deviations: Step-through pattern, Decreased stride length ?  ?Gait velocity interpretation: 1.31 - 2.62 ft/sec, indicative of limited community ambulator ?  ?General Gait Details: pt with steadying assist of RUE on IV pole with pt able to recall room number and self direct distance ? ? ?Stairs ?  ?  ?  ?  ?  ? ? ?Wheelchair Mobility ?  ? ?Modified Rankin (Stroke Patients Only) ?  ? ? ?  ?Balance Overall balance assessment: Mild deficits observed, not formally tested ?  ?  ?  ?  ?  ?  ?  ?  ?  ?  ?  ?  ?  ?  ?  ?  ?  ?  ?  ? ?  ?Cognition Arousal/Alertness: Awake/alert ?Behavior During Therapy: [facility] for tasks assessed/performed ?Overall Cognitive Status: Within Functional Limits for tasks assessed ?  ?  ?  ?  ?  ?  ?  ?  ?  ?  ?  ?  ?  ?  ?  ?  ?  ?  ?  ? ?  ?Exercises General Exercises - Lower Extremity ?Long Arc Quad: AROM, Both, Seated, 20 reps ?Hip ABduction/ADduction: AROM, Both, Seated, 20 reps ?Hip Flexion/Marching: AROM, Both, Seated, 20 reps ? ?  ?General Comments   ?  ?  ? ?Pertinent Vitals/Pain  Pain Assessment ?Pain Assessment: No/denies pain  ? ? ?Home Living   ?  ?  ?  ?  ?  ?  ?  ?  ?  ?   ?  ?Prior Function    ?  ?  ?   ? ?PT Goals (current goals can now be found in the care plan section) Progress towards PT goals: Progressing toward goals ? ?  ?Frequency ? ? ? Min 3X/week ? ? ? ?  ?PT Plan Current plan remains appropriate  ? ? ?Co-evaluation   ?  ?  ?  ?  ? ?  ?AM-PAC PT "6 Clicks" Mobility   ?Outcome Measure ? Help needed turning from your back to your side while in a flat bed without using bedrails?: A Little ?Help needed moving from lying on your back to sitting on the side of a flat bed  without using bedrails?: A Little ?Help needed moving to and from a bed to a chair (including a wheelchair)?: A Little ?Help needed standing up from a chair using your arms (e.g., wheelchair or bedside chair)?: A Little ?Help needed to walk in hospital room?: A Little ?Help needed climbing 3-5 steps with a railing? : A Little ?6 Click Score: 18 ? ?  ?End of Session   ?Activity Tolerance: Patient tolerated treatment well ?Patient left: in chair;with call bell/phone within reach;with family/visitor present ?Nurse Communication: Mobility status ?PT Visit Diagnosis: Other abnormalities of gait and mobility (R26.89);Difficulty in walking, not elsewhere classified (R26.2) ?  ? ? ?Time: 6440-3474 ?PT Time Calculation (min) (ACUTE ONLY): 29 min ? ?Charges:  $Gait Training: 8-22 mins ?$Therapeutic Exercise: 8-22 mins          ?          ? ?Johannah Rozas P, PT ?Acute Rehabilitation Services ?Pager: [phone] ?Office: [phone] ? ? ? ?Maurica Omura B Syndey Jaskolski ?12/30/2021, 10:52 AM ? ?

## 2021-12-30 NOTE — Progress Notes (Signed)
PHARMACY CONSULT NOTE FOR: ? ?OUTPATIENT  PARENTERAL ANTIBIOTIC THERAPY (OPAT) ? ?Indication: Enterococcal pacemaker infection   ?Regimen: Ampicillin 12 gm every 24 hours as a continuous infusion + Ceftriaxone 2 gm IV Q 12 hours  ?End date: 01/26/22 (4 weeks from wire removal) ? ?OPAT orders updated from original after wire removal. ? ?IV antibiotic discharge orders are pended. ?To discharging provider:  please sign these orders via discharge navigator,  ?Select New Orders & click on the button choice - Manage This Unsigned Work.  ?  ?Thank you for allowing pharmacy to participate in this patient's care. ? ?Reatha Harps, PharmD ?PGY1 Pharmacy Resident ?12/30/2021 12:56 PM ?Check [email or web address] for unit specific pharmacy number ? ? ?

## 2021-12-30 NOTE — Anesthesia Postprocedure Evaluation (Signed)
Anesthesia Post Note ? ?Patient: Jason Jarvis ? ?Procedure(s) Performed: PACEMAKER LEAD REMOVAL ([place]) ? ?  ? ?Patient location during evaluation: PACU ?Anesthesia Type: General ?Level of consciousness: awake and alert ?Pain management: pain level controlled ?Vital Signs Assessment: post-procedure vital signs reviewed and stable ?Respiratory status: spontaneous breathing, nonlabored ventilation, respiratory function stable and patient connected to nasal cannula oxygen ?Cardiovascular status: blood pressure returned to baseline and stable ?Postop Assessment: no apparent nausea or vomiting ?Anesthetic complications: no ? ? ?No notable events documented. ? ?Last Vitals:  ?Vitals:  ? 12/29/21 2002 12/30/21 0113  ?BP: 129/61 (!) 119/58  ?Pulse: 60 (!) 56  ?Resp: 19 15  ?Temp: 37.3 ?C 37.3 ?C  ?SpO2: 92% 95%  ?  ?Last Pain:  ?Vitals:  ? 12/30/21 0113  ?TempSrc: Oral  ?PainSc:   ? ? ?  ?  ?  ?  ?  ?  ? ?Obrian Bulson ? ? ? ? ?

## 2021-12-30 NOTE — TOC Initial Note (Signed)
Transition of Care (TOC) - Initial/Assessment Note  ? ? ?Patient Details  ?Name: Jason Jarvis ?MRN: [id number] ?Date of Birth: 17-Jul-1951 ? ?Transition of Care (TOC) CM/SW Contact:    ?Graves-Bigelow, Ocie Cornfield, RN ?Phone Number: ?12/30/2021, 2:42 PM ? ?Clinical Narrative: Case Manager received notification that the patient would be discharging home with IV antibiotic therapy. Family is agreeable with [facility] providing the IV antibiotics. [facility] Liaison Pam will educate the spouse on 12-31-21 regarding medication administration. Family is agreeable to [facility] to service the RN for IV antibiotic education. Spouse to provide transportation home after the connection on 12-31-21 late evening. Case Manager will continue to follow for additional transition of care needs.              ? ? ?Expected Discharge Plan: [place] ?Barriers to Discharge: No Barriers Identified ? ? ?Patient Goals and CMS Choice ?Patient states their goals for this hospitalization and ongoing recovery are:: to return home with IV antibiotics. ?CMS Medicare.gov Compare Post Acute Care list provided to:: Patient ?Choice offered to / list presented to : Spouse, Patient ? ?Expected Discharge Plan and Services ?Expected Discharge Plan: [place] ?In-house Referral: NA ?Discharge Planning Services: CM Consult ?Post Acute Care Choice: Home Health, Durable Medical Equipment (IV antibiotics) ?Living arrangements for the past 2 months: [place] ?                ?DME Arranged: IV pump/equipment ([facility]) ?DME Agency: Other - Comment ([facility]) ?Date DME Agency Contacted: 12/30/21 ?Time DME Agency Contacted: [phone] ?Representative spoke with at DME Agency: [place] Arranged: RN, IV Antibiotics ?[place] Agency: [place] ?Date HH Agency Contacted: 12/30/21 ?Time [place]: 2876 ?Representative spoke with at [place]: Tommi Rumps ? ?Prior Living Arrangements/Services ?Living arrangements for the past 2 months:  [place] ?Lives with:: Spouse ?Patient language and need for interpreter reviewed:: Yes ?Do you feel safe going back to the place where you live?: Yes      ?Need for Family Participation in Patient Care: Yes (Comment) ?Care giver support system in place?: Yes (comment) ?  ?Criminal Activity/Legal Involvement Pertinent to Current Situation/Hospitalization: No - Comment as needed ? ?Activities of Daily Living ?Home Assistive Devices/Equipment: Dentures (specify type), Eyeglasses, Hearing aid, Cane (specify quad or straight) ?ADL Screening (condition at time of admission) ?Patient's cognitive ability adequate to safely complete daily activities?: Yes ?Is the patient deaf or have difficulty hearing?: Yes ?Does the patient have difficulty seeing, even when wearing glasses/contacts?: No ?Does the patient have difficulty concentrating, remembering, or making decisions?: No ?Patient able to express need for assistance with ADLs?: No ?Does the patient have difficulty dressing or bathing?: No ?Independently performs ADLs?: Yes (appropriate for developmental age) ?Does the patient have difficulty walking or climbing stairs?: Yes ?Weakness of Legs: Both ?Weakness of Arms/Hands: Both ? ?Permission Sought/Granted ?Permission sought to share information with : Family Supports, Customer service manager, Case Manager ?Permission granted to share information with : Yes, Verbal Permission Granted ?   ? Permission granted to share info w AGENCY: [facility] ?   ?   ? ?Emotional Assessment ?Appearance:: Appears stated age ?Attitude/Demeanor/Rapport: Engaged ?Affect (typically observed): Appropriate ?Orientation: : Oriented to Place, Oriented to  Time, Oriented to Situation, Oriented to Self ?Alcohol / Substance Use: Not Applicable ?Psych Involvement: No (comment) ? ?Admission diagnosis:  Pyelonephritis [N12] ?Sepsis ([place]) [A41.9] ?Sepsis, due to unspecified organism, unspecified whether acute organ dysfunction  present ([place]) [A41.9] ?Altered  mental state [R41.82] ?Patient Active Problem List  ? Diagnosis Date Noted  ? Deep venous thrombosis of right tibial vein ([place]) 12/29/2021  ? Chronic systolic CHF (congestive heart failure) ([place]) 12/29/2021  ? Pacemaker infection ([place])   ? S/P placement of cardiac pacemaker   ? Bacteremia due to Enterococcus   ? Severe sepsis ([place]) 12/22/2021  ? Paroxysmal atrial fibrillation ([place]) 12/22/2021  ? Scrotal pain 12/22/2021  ? Coronary artery disease 12/22/2021  ? Type 2 diabetes mellitus ([place]) 12/22/2021  ? Hyponatremia 12/22/2021  ? Acute metabolic encephalopathy 02/06/5614  ? Hypercholesterolemia 04/22/2016  ? Right renal mass 03/31/2016  ? Pre-ulcerative corn or callous 03/03/2016  ? Diabetic polyneuropathy associated with type 2 diabetes mellitus ([place]) 03/03/2016  ? Cellulitis of middle toe 02/25/2016  ? Onychomycosis 08/21/2015  ? Metatarsalgia of left foot 08/21/2015  ? Metatarsalgia of right foot 07/03/2015  ? Bradycardia 05/14/2015  ? Brachial plexopathy 05/14/2015  ? Right brachial plexitis 05/12/2015  ? Radiculopathy 05/10/2015  ? Pain 05/10/2015  ? Neck pain on right side 05/10/2015  ? Cord compression ([place]) 05/10/2015  ? MVP (mitral valve prolapse) 06/10/2013  ? Hyperlipidemia 05/11/2013  ? Essential hypertension 05/11/2013  ? Chest pain 05/11/2013  ? Encounter for screening colonoscopy 05/19/2012  ? ?PCP:  Jeni Salles, MD ?Pharmacy:   ?[place], [place] ?[address] ?[place] [address] ?Phone: [phone] Fax: [phone] ? ? ?Readmission Risk Interventions ?   ? View : No data to display.  ?  ?  ?  ? ? ? ?

## 2021-12-30 NOTE — Progress Notes (Signed)
?  [place] for Infectious Disease ? ? ?Reason for visit: Follow up on pacemaker lead vegetation ? ?Interval History: Pacemaker removed yesterday.  TEE negative for vegetation.  He feels well, no fever.  He has not noted any new rash ?WBC 9.2.   ? ?Physical Exam: ?Constitutional:  ?Vitals:  ? 12/30/21 1046 12/30/21 1100  ?BP:  (!) 133/52  ?Pulse: (!) 56 62  ?Resp:  20  ?Temp:  99 ?F (37.2 ?C)  ?SpO2: 99% 97%  ? patient appears in NAD ?Respiratory: Normal respiratory effort; CTA B ?Chest: + bandage ? ? ?Review of Systems: ?Constitutional: negative for fevers and chills ?Integument/breast: negative for rash ? ?Lab Results  ?Component Value Date  ? WBC 9.2 12/30/2021  ? HGB 10.9 (L) 12/30/2021  ? HCT 34.7 (L) 12/30/2021  ? MCV 83.2 12/30/2021  ? PLT 188 12/30/2021  ?  ?Lab Results  ?Component Value Date  ? CREATININE 1.10 12/30/2021  ? BUN 16 12/30/2021  ? NA 138 12/30/2021  ? K 4.3 12/30/2021  ? CL 108 12/30/2021  ? CO2 23 12/30/2021  ?  ?Lab Results  ?Component Value Date  ? ALT 31 12/30/2021  ? AST 22 12/30/2021  ? ALKPHOS 70 12/30/2021  ?  ? ?Microbiology: ?Recent Results (from the past 240 hour(s))  ?Blood culture (routine x 2)     Status: Abnormal  ? Collection Time: 12/21/21  8:33 PM  ? Specimen: BLOOD  ?Result Value Ref Range Status  ? Specimen Description   Final  ?  BLOOD RIGHT ANTECUBITAL ?Performed at [facility], [address] [place], [place] [address] ?  ? Special Requests   Final  ?  BOTTLES DRAWN AEROBIC AND ANAEROBIC Blood Culture adequate volume ?Performed at [facility], [address], [place], [place] [address] ?  ? Culture  Setup Time   Final  ?  GRAM POSITIVE COCCI ?IN BOTH AEROBIC AND ANAEROBIC BOTTLES ?CRITICAL RESULT CALLED TO, READ BACK BY AND VERIFIED WITH: [place] [phone] [id number] FCP ?Performed at [facility], [place] [address], [place], [place] [address] ?  ? Culture ENTEROCOCCUS FAECALIS (A)  Final  ? Report Status 12/24/2021 FINAL  Final  ?  Organism ID, Bacteria ENTEROCOCCUS FAECALIS  Final  ?    Susceptibility  ? Enterococcus faecalis - MIC*  ?  AMPICILLIN <=2 SENSITIVE Sensitive   ?  VANCOMYCIN 1 SENSITIVE Sensitive   ?  GENTAMICIN SYNERGY SENSITIVE Sensitive   ?  * ENTEROCOCCUS FAECALIS  ?Resp Panel by RT-PCR (Flu A&B, Covid) Nasopharyngeal Swab     Status: None  ? Collection Time: 12/21/21  8:33 PM  ? Specimen: Nasopharyngeal Swab; Nasopharyngeal(NP) swabs in vial transport medium  ?Result Value Ref Range Status  ? SARS Coronavirus 2 by RT PCR NEGATIVE NEGATIVE Final  ?  Comment: (NOTE) ?SARS-CoV-2 target nucleic acids are NOT DETECTED. ? ?The SARS-CoV-2 RNA is generally detectable in upper respiratory ?specimens during the acute phase of infection. The lowest ?concentration of SARS-CoV-2 viral copies this assay can detect is ?138 copies/mL. A negative result does not preclude SARS-Cov-2 ?infection and should not be used as the sole basis for treatment or ?other patient management decisions. A negative result may occur with  ?improper specimen collection/handling, submission of specimen other ?than nasopharyngeal swab, presence of viral mutation(s) within the ?areas targeted by this assay, and inadequate number of viral ?copies(<138 copies/mL). A negative result must be combined with ?clinical observations, patient history, and  epidemiological ?information. The expected result is Negative. ? ?Fact Sheet for Patients:  ?EntrepreneurPulse.com.au ? ?Fact Sheet for Healthcare Providers:  ?IncredibleEmployment.be ? ?This test is no t yet approved or cleared by the [place] FDA and  ?has been authorized for detection and/or diagnosis of SARS-CoV-2 by ?FDA under an Emergency Use Authorization (EUA). This EUA will remain  ?in effect (meaning this test can be used) for the duration of the ?COVID-19 declaration under Section 564(b)(1) of the Act, 21 ?U.S.C.section 360bbb-3(b)(1), unless the authorization is terminated   ?or revoked sooner.  ? ? ?  ? Influenza A by PCR NEGATIVE NEGATIVE Final  ? Influenza B by PCR NEGATIVE NEGATIVE Final  ?  Comment: (NOTE) ?The Xpert Xpress SARS-CoV-2/FLU/RSV plus assay is intended as an aid ?in the diagnosis of influenza from Nasopharyngeal swab specimens and ?should not be used as a sole basis for treatment. Nasal washings and ?aspirates are unacceptable for Xpert Xpress SARS-CoV-2/FLU/RSV ?testing. ? ?Fact Sheet for Patients: ?EntrepreneurPulse.com.au ? ?Fact Sheet for Healthcare Providers: ?IncredibleEmployment.be ? ?This test is not yet approved or cleared by the [place] FDA and ?has been authorized for detection and/or diagnosis of SARS-CoV-2 by ?FDA under an Emergency Use Authorization (EUA). This EUA will remain ?in effect (meaning this test can be used) for the duration of the ?COVID-19 declaration under Section 564(b)(1) of the Act, 21 U.S.C. ?section 360bbb-3(b)(1), unless the authorization is terminated or ?revoked. ? ?Performed at [facility], [address] [place], [place] [address] ?  ?Blood Culture ID Panel (Reflexed)     Status: Abnormal  ? Collection Time: 12/21/21  8:33 PM  ?Result Value Ref Range Status  ? Enterococcus faecalis DETECTED (A) NOT DETECTED Final  ?  Comment: CRITICAL RESULT CALLED TO, READ BACK BY AND VERIFIED WITH: ?PHARMD ABuford Dresser [phone] [id number] FCP ?  ? Enterococcus Faecium NOT DETECTED NOT DETECTED Final  ? Listeria monocytogenes NOT DETECTED NOT DETECTED Final  ? Staphylococcus species NOT DETECTED NOT DETECTED Final  ? Staphylococcus aureus (BCID) NOT DETECTED NOT DETECTED Final  ? Staphylococcus epidermidis NOT DETECTED NOT DETECTED Final  ? Staphylococcus lugdunensis NOT DETECTED NOT DETECTED Final  ? Streptococcus species NOT DETECTED NOT DETECTED Final  ? Streptococcus agalactiae NOT DETECTED NOT DETECTED Final  ? Streptococcus pneumoniae NOT DETECTED NOT DETECTED Final  ? Streptococcus pyogenes  NOT DETECTED NOT DETECTED Final  ? A.calcoaceticus-baumannii NOT DETECTED NOT DETECTED Final  ? Bacteroides fragilis NOT DETECTED NOT DETECTED Final  ? Enterobacterales NOT DETECTED NOT DETECTED Final  ? Enterobacter cloacae complex NOT DETECTED NOT DETECTED Final  ? Escherichia coli NOT DETECTED NOT DETECTED Final  ? Klebsiella aerogenes NOT DETECTED NOT DETECTED Final  ? Klebsiella oxytoca NOT DETECTED NOT DETECTED Final  ? Klebsiella pneumoniae NOT DETECTED NOT DETECTED Final  ? Proteus species NOT DETECTED NOT DETECTED Final  ? Salmonella species NOT DETECTED NOT DETECTED Final  ? Serratia marcescens NOT DETECTED NOT DETECTED Final  ? Haemophilus influenzae NOT DETECTED NOT DETECTED Final  ? Neisseria meningitidis NOT DETECTED NOT DETECTED Final  ? Pseudomonas aeruginosa NOT DETECTED NOT DETECTED Final  ? Stenotrophomonas maltophilia NOT DETECTED NOT DETECTED Final  ? Candida albicans NOT DETECTED NOT DETECTED Final  ? Candida auris NOT DETECTED NOT DETECTED Final  ? Candida glabrata NOT DETECTED NOT DETECTED Final  ? Candida krusei NOT DETECTED NOT DETECTED Final  ? Candida parapsilosis NOT DETECTED NOT DETECTED Final  ? Candida tropicalis NOT DETECTED NOT DETECTED Final  ? Cryptococcus  neoformans/gattii NOT DETECTED NOT DETECTED Final  ? Vancomycin resistance NOT DETECTED NOT DETECTED Final  ?  Comment: Performed at [facility], [place] [address] [place], [place] [address]  ?Blood culture (routine x 2)     Status: Abnormal  ? Collection Time: 12/21/21  8:51 PM  ? Specimen: BLOOD  ?Result Value Ref Range Status  ? Specimen Description   Final  ?  BLOOD BLOOD LEFT WRIST ?Performed at [facility], [address], [place], [place] [address] ?  ? Special Requests   Final  ?  BOTTLES DRAWN AEROBIC AND ANAEROBIC Blood Culture results may not be optimal due to an inadequate volume of blood received in culture bottles ?Performed at [facility], [address], [place], [place] [address] ?   ? Culture  Setup Time   Final  ?  GRAM POSITIVE COCCI ?IN BOTH AEROBIC AND ANAEROBIC BOTTLES ?CRITICAL VALUE NOTED.  VALUE IS CONSISTENT WITH PREVIOUSLY REPORTED AND CALLED VALUE. ?  ? Culture (A)  [place]

## 2021-12-30 NOTE — Progress Notes (Addendum)
?Progress Note ? ? ?PatientGurfateh Jarvis QMV:[id number] DOB: 08-Mar-1951 DOA: 12/21/2021     8 ?DOS: the patient was seen and examined on 12/30/2021 at 11:12A ?  ? ? ? ?Brief hospital course: ?[name] is a 71 y.o. M with hx CAD, pAF not on AC and bradycardia s/p PPM c/b MRSA bacteremia in 2019 with PPM removal then later reimplantation, hx COPD, CKD 2, seizures no longer on AED, dCHF, R renal cell carcinoma s/p cryoablation 2017, left knee replacement who presented with AMS, scrotal pain, flank pain and fever.   ? ?Patient works night shift and came home morning of admission, looked sick, wife noticed somewhat confused, warm, complaining of scrotal pain, flank pain which he told her at that time had been present a few days.  ? ?Sent to the ER where UA negative, CT head normal, CT abdomen/pelvis unremarkable, CXR clear but temp 102.48F, RR 38 and WBC 15.1K, and confused ? ? ?4/15: started on Me[place] and admitted for sepsis ?4/16: US scrotum remarkable for microcalcifications on R testicle without abscess, fluid or sign of infection --> blood cultures positive for enterococcus; CT left foot normal; [place] LE shows age indeterminate DVT in left posterior tib; ID consulted, switched to ampicillin ?4/17: TTE with EF 50%, grade I DD ?4/20: TEE shows vegetation on pacer lead ?4/23: Pacer removed ? ? ? ? ? ?Assessment and Plan: ?* Severe sepsis ([place]) ?Bacteremia due to Enterococcus ?P/w fever, leukocytosis, respiratory rate 38 and acute metabolic encephalopathy.  Found to have enterococcus bacteremia. ? ?Subsequent TEE showed vegetation on PPM lead. ? ?- Continue ampicillin and Rocephin, 4 weeks from PPM extraction, end date 5/21 ?- Consult ID, appreciate cares ? ? ? ? ?Pacemaker infection ([place]) ?S/p PPM extraction 4/23 by Dr. Lovena Le ?No pacing needed with transvenous pacer in last 24 hours, given now second episode of PPM infection, for now, plan will be not to reimplant. ?- TV pacer to be removed by EP today ?- Outpatient EP  follow up ?- Avoid nodal blocking agents ? ?-Hold Lovenox/anticoaguation 5 days from PPM removal (risk for hematoma) ? ? ?  ? ?Chronic systolic CHF (congestive heart failure) ([place]) ?Acute CHF ruled out. ?-Continue furosemide PO ? ? ?Deep venous thrombosis of right tibial vein ([place]) ?Age indeterminate DVT seen on [place], incidental finding. ?-Hold Lovenox for now given risk of PPM pocket hematoma ?- Start Eliquis for Afib in 4 days  ? ?Acute metabolic encephalopathy ?At baseline, patient independent,has no cognitive impairment, works 3 jobs.   ?On admission, patient was alert to self and place only, sleepy and disoriented to situation. ? ?This is now resolved with treatment of sepsis. ? ?Hyponatremia ?Na resolved with treatment of sepsis.  ? ?Type 2 diabetes mellitus ([place]) ?A1C 6.5% ?-Continue SS corrections ? ?Coronary artery disease ?-Continue losartan, atorvastatin, aspirin ? ?Scrotal pain ?No evidence of cystitis, epididymitis on exam/labwork. ?US Scrotum with microcalcification on R scrotum, unsure significance in setting of RCC ? ?Paroxysmal atrial fibrillation (H[facility]) ?Remains in sinus bradycardia after PPM removal. ?Discussed with EP.  This is in his prior chart without mention of AC at last Cardiology office visit.   ? ?PPM interrogated for single afib episode on 4/14 of 5 hours duration (day prior to his admission, when he was septic) with a total burden of 0.6% ?- Stop Lovenox as above ?- Plan for Eliquis in 4 days as above ?- Hold nodal blockade given bradycardia ? ? ? ?Essential hypertension ?-Continue losartan, furosemide, doxazosin, amlodipine ? ?  ? ? ? ? ? ? ? ? ? ?  Subjective: Patient feeling well, asking to go home, no confusion, no fever, no chest pain, no dyspnea, no swelling ? ? ? ? ?Physical Exam: ?Vitals:  ? 12/30/21 0200 12/30/21 0456 12/30/21 1046 12/30/21 1100  ?BP: (!) 127/55 (!) 126/56  (!) 133/52  ?Pulse: (!) 52 (!) 56 (!) 56 62  ?Resp: '18 19  20  '$ ?Temp:  99.2 ?F (37.3 ?C)  99 ?F (37.2 ?C)   ?TempSrc:  Oral  Axillary  ?SpO2: 92% 94% 99% 97%  ?Weight:  90.6 kg    ?Height:      ? ?Elderly adult male, sitting in recliner, appears debilitated ?Heart rate is regular but slow, no murmurs I can appreciate, no peripheral edema ?Lung sounds clear without rales or wheezes, normal respiratory rate ?Attention normal, affect appropriate, judgment Syprine normal ? ?Data Reviewed: ?Discussed with electrophysiology, infectious disease.  Nursing notes reviewed, vital signs reviewed. ?Complete metabolic panel normal ?Complete blood count showed hemoglobin 11, no change ? ?Family Communication: Wife at the bed side ? ? ? ?Disposition: ?Status is: Inpatient ?The patient was admitted for enterococcal bacteremia.  He will have his transvenous pacer removed today and a PICC line placed at some point.  If he is stable tomorrow morning we will plan to discharge   ? ? ? ? ? ? ? ?Author: ?Edwin Dada, MD ?12/30/2021 2:29 PM ? ?For on call review [email or web address].  ? ? ?

## 2021-12-30 NOTE — TOC Benefit Eligibility Note (Addendum)
Patient Advocate Encounter ? ?Insurance verification completed.   ? ?The patient is currently admitted and upon discharge could be taking Eliquis 5 mg. ? ?The current 30 day co-pay is, $45.00.  ? ?The patient is currently admitted and upon discharge could be taking Xarelto 20 mg. ? ?The current 30 day co-pay is, $45.00.  ? ?The patient is currently admitted and upon discharge could be taking enioxaparin (Lovenox) 100 mg/ml. ? ?The current 30 day co-pay is, $95.00.  ? ?The patient is insured through [employer] Part D  ? ? ? ?Jason Jarvis, CPhT ?Pharmacy Patient Advocate Specialist ?[place] Patient Advocate Team ?Direct Number: [phone]  Fax: [phone] ? ? ? ? ? ?  ?

## 2021-12-30 NOTE — Progress Notes (Addendum)
? ?Progress Note ? ?Patient Name: Jason Jarvis ?Date of Encounter: 12/30/2021 ? ?[facility] Cardiologist: Dr. Gerarda Gunther (Osborne Oman, [facility]) ? ?Subjective  ? ?No CP, site pain or SOB, wants to go home ? ?Inpatient Medications  ?  ?Scheduled Meds: ? amLODipine  2.5 mg Oral Daily  ? aspirin EC  81 mg Oral Daily  ? atorvastatin  40 mg Oral Daily  ? Chlorhexidine Gluconate Cloth  6 each Topical Daily  ? doxazosin  2 mg Oral QHS  ? enoxaparin (LOVENOX) injection  1 mg/kg Subcutaneous Q12H  ? furosemide  40 mg Oral Daily  ? insulin aspart  0-5 Units Subcutaneous QHS  ? insulin aspart  0-9 Units Subcutaneous TID WC  ? losartan  50 mg Oral Daily  ? mupirocin ointment   Nasal BID  ? pantoprazole  40 mg Oral BID  ? potassium chloride  10 mEq Oral Daily  ? senna-docusate  2 tablet Oral Daily  ? ?Continuous Infusions: ? sodium chloride Stopped (12/27/21 0550)  ? ampicillin (OMNIPEN) IV 2 g (12/30/21 0303)  ? cefTRIAXone (ROCEPHIN)  IV 2 g (12/29/21 2255)  ? ?PRN Meds: ?sodium chloride, acetaminophen, melatonin, nitroGLYCERIN, ondansetron (ZOFRAN) IV, oxyCODONE, polyethylene glycol, traMADol  ? ?Vital Signs  ?  ?Vitals:  ? 12/29/21 2002 12/30/21 0113 12/30/21 0200 12/30/21 0456  ?BP: 129/61 (!) 119/58 (!) 127/55 (!) 126/56  ?Pulse: 60 (!) 56 (!) 52 (!) 56  ?Resp: '19 15 18 19  '$ ?Temp: 99.2 ?F (37.3 ?C) 99.2 ?F (37.3 ?C)  99.2 ?F (37.3 ?C)  ?TempSrc: Oral Oral  Oral  ?SpO2: 92% 95% 92% 94%  ?Weight:    90.6 kg  ?Height:      ? ? ?Intake/Output Summary (Last 24 hours) at 12/30/2021 0729 ?Last data filed at 12/30/2021 8242 ?Gross per 24 hour  ?Intake 2618.35 ml  ?Output 1400 ml  ?Net 1218.35 ml  ? ? ?  12/30/2021  ?  4:56 AM 12/29/2021  ?  6:31 AM 12/28/2021  ?  5:00 AM  ?Last 3 Weights  ?Weight (lbs) 199 lb 11.8 oz 190 lb 4.8 oz 192 lb 0.3 oz  ?Weight (kg) 90.6 kg 86.32 kg 87.1 kg  ?   ? ?Telemetry  ?  ?SB 50's - Personally Reviewed ? ?ECG  ?  ?SB 52bpm, RBBB - Personally Reviewed ? ?Physical Exam  ? ?GEN: No acute distress.   ?Neck: No  JVD ?Cardiac: RRR, bradycardic, no murmurs, rubs, or gallops.  ?Respiratory: CTA b/l. ?GI: Soft, nontender, non-distended  ?MS: No edema; No deformity. ?Neuro:  Nonfocal  ?Psych: Normal affect  ? ?Labs  ?  ?High Sensitivity Troponin:  No results for input(s): TROPONINIHS in the last 720 hours.   ?Chemistry ?Recent Labs  ?Lab 12/25/21 ?0407 12/28/21 ?3536 12/29/21 ?1443 12/30/21 ?1540  ?NA 138 139 139 138  ?K 4.6 4.2 4.4 4.3  ?CL 107 109 106 108  ?CO2 '24 25 25 23  '$ ?GLUCOSE 118* 114* 135* 115*  ?BUN '21 22 17 16  '$ ?CREATININE 1.09 1.03 1.10 1.10  ?CALCIUM 8.8* 8.7* 8.9 8.5*  ?MG 2.1  --   --   --   ?PROT  --  6.1* 5.7* 5.2*  ?ALBUMIN  --  3.2* 2.8* 2.8*  ?AST  --  '24 24 22  '$ ?ALT  --  38 36 31  ?ALKPHOS  --  08 67 61  ?BILITOT  --  0.5 0.5 0.7  ?GFRNONAA >60 >60 >60 >60  ?ANIONGAP '7 5 8 7  '$ ?  ?  Lipids No results for input(s): CHOL, TRIG, HDL, LABVLDL, LDLCALC, CHOLHDL in the last 168 hours.  ?Hematology ?Recent Labs  ?Lab 12/28/21 ?0322 12/29/21 ?0213 12/30/21 ?0443  ?WBC 7.0 7.3 9.2  ?RBC 4.45 4.45 4.17*  ?HGB 12.1* 11.6* 10.9*  ?HCT 38.1* 37.6* 34.7*  ?MCV 85.6 84.5 83.2  ?MCH 27.2 26.1 26.1  ?MCHC 31.8 30.9 31.4  ?RDW 14.6 14.6 14.6  ?PLT 173 181 188  ? ?Thyroid No results for input(s): TSH, FREET4 in the last 168 hours.  ?BNPNo results for input(s): BNP, PROBNP in the last 168 hours.  ?DDimer No results for input(s): DDIMER in the last 168 hours.  ? ?Radiology  ?  ?CXR is done, pending read ? ?Cardiac Studies  ? ? ?12/26/2021: TEE ? 1. Dual chamber pacemaker seen. Possible tiny vegetation on RA lead. At  ?the distal aspect of the RV lead, there is an independently mobile,  ?heterogeneous echo density that may be partially calcified. 1 x 0.7 cm. In  ?the setting of bacteremia, this is  ?most consistent with pacemaker lead vegetation. Right ventricular systolic  ?function is normal. The right ventricular size is normal.  ? 2. Left ventricular ejection fraction, by estimation, is 60 to 65%. The  ?left ventricle has normal  function.  ? 3. No left atrial/left atrial appendage thrombus was detected. The LAA  ?emptying velocity was 98 cm/s.  ? 4. A small pericardial effusion is present.  ? 5. The mitral valve is grossly normal. Trivial mitral valve  ?regurgitation.  ? 6. The aortic valve is tricuspid. Aortic valve regurgitation is trivial.  ? 7. Aortic dilatation noted. There is borderline dilatation of the aortic  ?root, measuring 38 mm. There is borderline dilatation of the ascending  ?aorta, measuring 39 mm.  ? 8. Agitated saline contrast bubble study was negative, with no evidence  ?of any interatrial shunt.  ? ? ?09/28/2019: [facility] ?CONCLUSIONS:  ?1. Nonobstructive coronary artery plaque  ?2. Preserved left ventricular ejection fraction  ?3. Nonischemic chest discomfort  ?4. Dyspnea likely multifactorial and not secondary to obstructive coronary  ?disease  ? ?RECOMMENDATIONS:  ?1. Guideline directed medical management of hypertension, nonobstructive  ?coronary artery disease, cardiac risk factors  ?2. Follow-up with Drs. Wahid and Dot Lanes   ? ?Patient Profile  ?   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 ?TEE findings c/w endocarditis/pacing system 12/26/21 ?Underwent PPM system extraction yesterday with Dr. Lovena Le and implant f a temp-perm (L) ? ?Unclear mention of hx of Afib not on a/c, in review of his last outpatient cardiology note, mention in the problem list, but not specifically discussed ? ? ?Assessment & Plan  ?  ?Sepsis ?Bacteremia ?#E faecalis bacteremia  ?PPM system endocarditis (again) ? ?S/p PPM system extraction/placement of a temp-perm 12/29/21 ?BC from 12/25/21 neg for 4 days so far ? ?Await ID today for guidance on re-implant of PPM timing ? ?Sites are stable ?No  bleeding, no  hematoma ? ? ?ADDEND: ?In review of pre-extraction device interrogation  ?One AFib episode on 12/20/21 of 5 hours duration(day prior to his admission (septic) ?Burden 0.6% ? ?Addend: ?Temp-perm pulled without difficulty by Dr. Lovena Le ?Remains SB 48-50's ?D/w Drs. Lovena Le and [place] ?Given age indeterminate DVT will STOP lovenox ?Plan to start Eliquis 01/03/22 ? ?DO NOT use  any nodal blocking agents for this patient going forward ? ? ?For questions or updates, please contact [place] ?Please consult [email or web address] for contact info under  ? ?Signed, ?Renee Dyane Dustman, PA-C  ?12/30/2021, 7:29 AM   ? ?EP Attending ? ?Patient seen and examined. Agree with above. The patient is doing well. He has had his temp perm PM removed and his HR's remain in the 50's. He will have a PICC line placed and plan for DC home tomorrow. ? ?Salome Spotted ? ?

## 2021-12-31 ENCOUNTER — Inpatient Hospital Stay (HOSPITAL_COMMUNITY): Payer: Medicare HMO

## 2021-12-31 DIAGNOSIS — R4182 Altered mental status, unspecified: Secondary | ICD-10-CM | POA: Diagnosis not present

## 2021-12-31 DIAGNOSIS — I5023 Acute on chronic systolic (congestive) heart failure: Secondary | ICD-10-CM | POA: Diagnosis not present

## 2021-12-31 DIAGNOSIS — G9341 Metabolic encephalopathy: Secondary | ICD-10-CM | POA: Diagnosis not present

## 2021-12-31 DIAGNOSIS — A419 Sepsis, unspecified organism: Secondary | ICD-10-CM | POA: Diagnosis not present

## 2021-12-31 LAB — PROTIME-INR
INR: 1.1 (ref 0.8–1.2)
Prothrombin Time: 13.9 seconds (ref 11.4–15.2)

## 2021-12-31 LAB — CBC
HCT: 33.4 % — ABNORMAL LOW (ref 39.0–52.0)
Hemoglobin: 10.8 g/dL — ABNORMAL LOW (ref 13.0–17.0)
MCH: 26.9 pg (ref 26.0–34.0)
MCHC: 32.3 g/dL (ref 30.0–36.0)
MCV: 83.1 fL (ref 80.0–100.0)
Platelets: 169 10*3/uL (ref 150–400)
RBC: 4.02 MIL/uL — ABNORMAL LOW (ref 4.22–5.81)
RDW: 14.7 % (ref 11.5–15.5)
WBC: 9.3 10*3/uL (ref 4.0–10.5)
nRBC: 0 % (ref 0.0–0.2)

## 2021-12-31 LAB — GLUCOSE, CAPILLARY
Glucose-Capillary: 146 mg/dL — ABNORMAL HIGH (ref 70–99)
Glucose-Capillary: 170 mg/dL — ABNORMAL HIGH (ref 70–99)
Glucose-Capillary: 206 mg/dL — ABNORMAL HIGH (ref 70–99)
Glucose-Capillary: 269 mg/dL — ABNORMAL HIGH (ref 70–99)
Glucose-Capillary: 189 mg/dL — ABNORMAL HIGH (ref 70–99)

## 2021-12-31 MED ORDER — HEPARIN SOD (PORK) LOCK FLUSH 100 UNIT/ML IV SOLN
INTRAVENOUS | Status: AC
  Administered 2021-12-31: 500 [IU]
  Filled 2021-12-31: qty 5

## 2021-12-31 MED ORDER — OXYCODONE HCL 5 MG PO TABS
5.0000 mg | ORAL_TABLET | Freq: Once | ORAL | Status: AC
  Administered 2021-12-31: 5 mg via ORAL
  Filled 2021-12-31: qty 1

## 2021-12-31 MED ORDER — LIDOCAINE HCL 1 % IJ SOLN
INTRAMUSCULAR | Status: AC
  Administered 2021-12-31: 10 mL
  Filled 2021-12-31: qty 20

## 2021-12-31 MED ORDER — PANTOPRAZOLE SODIUM 40 MG PO TBEC
40.0000 mg | DELAYED_RELEASE_TABLET | Freq: Every day | ORAL | Status: DC
  Administered 2021-12-31 – 2022-01-01 (×2): 40 mg via ORAL
  Filled 2021-12-31 (×2): qty 1

## 2021-12-31 NOTE — Procedures (Signed)
PROCEDURE SUMMARY: ? ?Successful placement of double lumen PICC line to right basilic vein. ?Length 36 cm ?Tip at lower SVC/RA ?PICC capped ?No complications ?Ready for use ? ?EBL < 5 mL ? ? ?Kelani Robart H Brystal Kildow PA-C ?12/31/2021, 2:34 PM ? ? ? ?

## 2021-12-31 NOTE — Progress Notes (Addendum)
Pt was complaining of surgical pain, with noticeable swelling that was not present during my initial assessment. Utram was previously given and ice applied to the site. Pt states that his pain has now decreased to that of 4/10. Rose, MD was made aware, will continue to monitor.  ? ?Elaina Hoops, RN ? ?

## 2021-12-31 NOTE — Progress Notes (Signed)
?Progress Note ? ? ?PatientMaddoxx Jarvis MHD:[id number] DOB: 12-Feb-1951 DOA: 12/21/2021     9 ?DOS: the patient was seen and examined on 12/31/2021 at 9:30AM ?  ? ? ? ?Brief hospital course: ?Jason Jarvis is a 71 y.o. M with hx CAD, pAF not on AC and bradycardia s/p PPM c/b MRSA bacteremia in 2019 with PPM removal then later reimplantation, hx COPD, CKD 2, seizures no longer on AED, dCHF, R renal cell carcinoma s/p cryoablation 2017, left knee replacement who presented with AMS, scrotal pain, flank pain and fever.   ? ?Patient works night shift and came home morning of admission, looked sick, wife noticed somewhat confused, warm, complaining of scrotal pain, flank pain which he told her at that time had been present a few days.  ? ?Sent to the ER where UA negative, CT head normal, CT abdomen/pelvis unremarkable, CXR clear but temp 102.33F, RR 38 and WBC 15.1K, and confused ? ? ?4/15: started on Merem and admitted for sepsis ?4/16: US scrotum remarkable for microcalcifications on R testicle without abscess, fluid or sign of infection --> blood cultures positive for enterococcus; CT left foot normal; [place] LE shows age indeterminate DVT in left posterior tib; ID consulted, switched to ampicillin ?4/17: TTE with EF 50%, grade I DD ?4/20: TEE shows vegetation on pacer lead ?4/23: Pacer removed ?4/25: PICC line placed ? ? ? ? ?Assessment and Plan: ?* Severe sepsis ([place]) ?Bacteremia due to Enterococcus ?P/w fever, leukocytosis, respiratory rate 38 and acute metabolic encephalopathy.  Found to have enterococcus bacteremia. ? ?Subsequent TEE showed vegetation on PPM lead. ? ?- Continue ampicillin and Rocephin, 4 weeks from PPM extraction, end date 5/21 ?- Consult ID, appreciate cares ? ? ?Pacemaker infection ([place]) ?S/p PPM extraction 4/23 by Dr. Lovena Le ?Pacemaker pocket hematoma ?PPM removed day before yesterday, transvenous pacer dropped, no pacing needed with transvenous pacer over 24 hours, so removed yesterday.   ? ?Given now  second episode of PPM infection, for now, plan will be not to reimplant. ?- Outpatient EP follow up on Friday for wound check, hematoma check ?- Avoid nodal blocking agents ? ?- Stop home metoprolol ? ?- Hold Lovenox/anticoaguation 5 days from PPM removal (risk for hematoma) ?- Keep compression dressing in place until Friday appt with EP ? ? ? Paroxysmal atrial fibrillation (HCC) ?Remains in sinus bradycardia after PPM removal. ?Discussed with EP.  This is in his prior chart without mention of AC at last Cardiology office visit.   ? ?PPM interrogated for single afib episode on 4/14 of 5 hours duration (day prior to his admission, when he was septic) with a total burden of 0.6% ? ?CHA2DS2-Vasc 5 (age, CHF, DM, HTN, vascular disease) ?- Stop Lovenox as above ?- Plan for new start Eliquis in 3 days as above ?- Hold nodal blockade given bradycardia ? ? ? ? ? ?Chronic systolic CHF (congestive heart failure) ([place]) ?Acute CHF ruled out. ?-Continue furosemide PO ? ? 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  Regardless, will be on Eliquis indefinitely for Afib. ?Was on enoxaparin here therapeutically until PPM extraction.  ? ?- Hold Lovenox and Eliquis for now given risk of PPM pocket hematoma ?- EP will plan to start Eliquis for Afib as an outpatient ? ? ? ?Acute metabolic encephalopathy ?At baseline, patient independent,has no cognitive impairment, works 3 jobs.   ?On admission, patient was alert to self and place only, sleepy and disoriented to situation. ? ?  This is now resolved with treatment of sepsis. ? ? ? ?Hyponatremia ?Na resolved with treatment of sepsis.  ? ? ? ? ?Type 2 diabetes mellitus ([place]) ?A1C 6.5% ?-Continue SS corrections ? ?Coronary artery disease ?-Continue losartan, atorvastatin ?- Stop aspirin now that Eliquis will be started ? ?Scrotal pain ?No evidence of cystitis, epididymitis on exam/labwork. ?US Scrotum with microcalcification on R  scrotum, unsure significance in setting of RCC ? ? ? ? ? ?Essential hypertension ?-Continue losartan, furosemide, doxazosin, amlodipine ? ?Acute on chronic systolic CHF (congestive heart failure) (HCC)-resolved as of 12/25/2021 ?No recent Echo on file, appears euvolemic ?F/u TTE today ?Resume HTN medications as clinically appropraite if no hypotension/shock ? ? ? ? ? ? ? ? ? ?Subjective: Patient feels fine.  He had some swelling of his pacemaker pocket overnight, and this is uncomfortable, but no fever, dyspnea, chest pain, palpitations. ? ? ? ? ?Physical Exam: ?Vitals:  ? 12/31/21 0424 12/31/21 0802 12/31/21 1038 12/31/21 1153  ?BP: (!) 167/71 (!) 152/82 (!) 151/65 (!) 154/65  ?Pulse: 83 77  89  ?Resp: (!) 23 (!) 23  20  ?Temp: 99.1 ?F (37.3 ?C) 98.8 ?F (37.1 ?C)  98.9 ?F (37.2 ?C)  ?TempSrc: Oral Oral  Oral  ?SpO2: 95% 96%  94%  ?Weight: 92.5 kg     ?Height:      ? ?Elderly adult male, sitting up in recliner, interactive and appropriate. ?Heart rate slow but regular, no murmurs, no peripheral edema. ?Respiratory rate normal, lungs clear without rales or wheezes ?Abdomen soft ?Attention normal, affect normal, judgment and insight appear normal, dysarthric at baseline, slight left eyelid droop at baseline, baseline per wife ? ? ? ? ?Data Reviewed: ?Discussed with electrophysiology, nursing notes reviewed, vital signs reviewed. ?Labs notable for creatinine 1.1, CBC shows hemoglobin 10, no change, white blood cells within normal limits ?Glucose normal ? ?Family Communication: Wife at the bedside ? ? ? ?Disposition: ?Status is: Inpatient ?The patient was admitted for severe sepsis, found to have enterococcal bacteremia had a pacemaker infection. ? ?He is now status post pacemaker infection and has had his PICC line placed ? ?He is ready for discharge today, except that his insurance has delayed approval of his outpatient antibiotics.  He is medically ready for discharge soon as outpatient antibiotic authorization has  been approved. ? ? ? ? ? ? ? ? ? ? ? ?Author: ?Edwin Dada, MD ?12/31/2021 2:55 PM ? ?For on call review [email or web address].  ? ? ?

## 2021-12-31 NOTE — Progress Notes (Addendum)
? ?Progress Note ? ?Patient Name: Jason Jarvis ?Date of Encounter: 12/31/2021 ? ?[facility] Cardiologist: Dr. Gerarda Gunther (Osborne Oman, [facility]) ? ?Subjective  ? ?Pain at extraction site, no CP, no SOB, no other complaints or concerns ? ?Inpatient Medications  ?  ?Scheduled Meds: ? amLODipine  2.5 mg Oral Daily  ? aspirin  81 mg Oral Daily  ? atorvastatin  40 mg Oral Daily  ? Chlorhexidine Gluconate Cloth  6 each Topical Daily  ? doxazosin  2 mg Oral QHS  ? furosemide  40 mg Oral Daily  ? insulin aspart  0-5 Units Subcutaneous QHS  ? insulin aspart  0-9 Units Subcutaneous TID WC  ? losartan  50 mg Oral Daily  ? mupirocin ointment   Nasal BID  ? pantoprazole sodium  40 mg Per Tube Daily  ? potassium chloride SA  10 mEq Oral Daily  ? senna-docusate  2 tablet Oral Daily  ? ?Continuous Infusions: ? sodium chloride Stopped (12/27/21 0550)  ? ampicillin (OMNIPEN) IV 2 g (12/31/21 0441)  ? cefTRIAXone (ROCEPHIN)  IV 2 g (12/30/21 2115)  ? ?PRN Meds: ?sodium chloride, acetaminophen, melatonin, nitroGLYCERIN, ondansetron (ZOFRAN) IV, oxyCODONE, polyethylene glycol, traMADol  ? ?Vital Signs  ?  ?Vitals:  ? 12/30/21 1100 12/30/21 2049 12/31/21 0424 12/31/21 0802  ?BP: (!) 133/52 (!) 155/56 (!) 167/71 (!) 152/82  ?Pulse: 62 (!) 58 83 77  ?Resp: 20 (!) 22 (!) 23 (!) 23  ?Temp: 99 ?F (37.2 ?C) 98.9 ?F (37.2 ?C) 99.1 ?F (37.3 ?C) 98.8 ?F (37.1 ?C)  ?TempSrc: Axillary Oral Oral Oral  ?SpO2: 97% 96% 95% 96%  ?Weight:   92.5 kg   ?Height:      ? ? ?Intake/Output Summary (Last 24 hours) at 12/31/2021 0830 ?Last data filed at 12/31/2021 [phone] ?Gross per 24 hour  ?Intake 960 ml  ?Output 1650 ml  ?Net -690 ml  ? ? ?  12/31/2021  ?  4:24 AM 12/30/2021  ?  4:56 AM 12/29/2021  ?  6:31 AM  ?Last 3 Weights  ?Weight (lbs) 203 lb 14.8 oz 199 lb 11.8 oz 190 lb 4.8 oz  ?Weight (kg) 92.5 kg 90.6 kg 86.32 kg  ?   ? ?Telemetry  ?  ?SR 60's-80s - Personally Reviewed ? ?ECG  ?  ?No new EKGs - Personally Reviewed ? ?Physical Exam  ? ?GEN: No acute distress.    ?Neck: No JVD, IJ site is stable, no bleeding or hematoma ?Cardiac: RRR,  no murmurs, rubs, or gallops.  ?R groins site stable, no bleeding or hematoma ?Respiratory: CTA b/l. ?GI: Soft, nontender, non-distended  ?MS: No edema; No deformity. ?Neuro:  Nonfocal  ?Psych: Normal affect  ? ?Extraction site is firm very tender with hematoma noted at the pocket and extending towards L axilla, no active bleeding ? ?Labs  ?  ?High Sensitivity Troponin:  No results for input(s): TROPONINIHS in the last 720 hours.   ?Chemistry ?Recent Labs  ?Lab 12/25/21 ?0407 12/28/21 ?3903 12/29/21 ?0092 12/30/21 ?3300  ?NA 138 139 139 138  ?K 4.6 4.2 4.4 4.3  ?CL 107 109 106 108  ?CO2 '24 25 25 23  '$ ?GLUCOSE 118* 114* 135* 115*  ?BUN '21 22 17 16  '$ ?CREATININE 1.09 1.03 1.10 1.10  ?CALCIUM 8.8* 8.7* 8.9 8.5*  ?MG 2.1  --   --   --   ?PROT  --  6.1* 5.7* 5.2*  ?ALBUMIN  --  3.2* 2.8* 2.8*  ?AST  --  '24 24 22  '$ ?ALT  --  38 36 31  ?ALKPHOS  --  29 47 65  ?BILITOT  --  0.5 0.5 0.7  ?GFRNONAA >60 >60 >60 >60  ?ANIONGAP '7 5 8 7  '$ ?  ?Lipids No results for input(s): CHOL, TRIG, HDL, LABVLDL, LDLCALC, CHOLHDL in the last 168 hours.  ?Hematology ?Recent Labs  ?Lab 12/29/21 ?0213 12/30/21 ?0443 12/31/21 ?0334  ?WBC 7.3 9.2 9.3  ?RBC 4.45 4.17* 4.02*  ?HGB 11.6* 10.9* 10.8*  ?HCT 37.6* 34.7* 33.4*  ?MCV 84.5 83.2 83.1  ?MCH 26.1 26.1 26.9  ?MCHC 30.9 31.4 32.3  ?RDW 14.6 14.6 14.7  ?PLT 181 188 169  ? ?Thyroid No results for input(s): TSH, FREET4 in the last 168 hours.  ?BNPNo results for input(s): BNP, PROBNP in the last 168 hours.  ?DDimer No results for input(s): DDIMER in the last 168 hours.  ? ?Radiology  ?  ?12/30/21: CXR ?FINDINGS: ?There is a single lead pacemaker with tip overlying the right ?ventricle. There was previously a dual lead pacemaker. Unchanged ?mild cardiomegaly. No focal airspace consolidation. No pleural ?effusion. No pneumothorax. No acute osseous abnormality. ?  ?IMPRESSION: ?Single lead pacemaker with tip overlying the right  ventricle. ?  ?No focal airspace consolidation or pneumothorax. ? ?Cardiac Studies  ? ? ?12/26/2021: TEE ? 1. Dual chamber pacemaker seen. Possible tiny vegetation on RA lead. At  ?the distal aspect of the RV lead, there is an independently mobile,  ?heterogeneous echo density that may be partially calcified. 1 x 0.7 cm. In  ?the setting of bacteremia, this is  ?most consistent with pacemaker lead vegetation. Right ventricular systolic  ?function is normal. The right ventricular size is normal.  ? 2. Left ventricular ejection fraction, by estimation, is 60 to 65%. The  ?left ventricle has normal function.  ? 3. No left atrial/left atrial appendage thrombus was detected. The LAA  ?emptying velocity was 98 cm/s.  ? 4. A small pericardial effusion is present.  ? 5. The mitral valve is grossly normal. Trivial mitral valve  ?regurgitation.  ? 6. The aortic valve is tricuspid. Aortic valve regurgitation is trivial.  ? 7. Aortic dilatation noted. There is borderline dilatation of the aortic  ?root, measuring 38 mm. There is borderline dilatation of the ascending  ?aorta, measuring 39 mm.  ? 8. Agitated saline contrast bubble study was negative, with no evidence  ?of any interatrial shunt.  ? ? ?09/28/2019: [facility] ?CONCLUSIONS:  ?1. Nonobstructive coronary artery plaque  ?2. Preserved left ventricular ejection fraction  ?3. Nonischemic chest discomfort  ?4. Dyspnea likely multifactorial and not secondary to obstructive coronary  ?disease  ? ?RECOMMENDATIONS:  ?1. Guideline directed medical management of hypertension, nonobstructive  ?coronary artery disease, cardiac risk factors  ?2. Follow-up with Drs. Wahid and Dot Lanes   ? ?Patient Profile  ?   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 ?TEE findings c/w  endocarditis/pacing system 12/26/21 ?Underwent PPM system extraction yesterday with Dr. Lovena Le and implant f a temp-perm (L) ? ?Unclear mention of hx of Afib not on a/c, in review of his last outpatient cardiology note, mention in the problem list, but not specifically discussed ? ? ?Assessment & Plan  ?  ?Sepsis ?Bacteremia ?#E faecalis bacteremia  ?PPM system endocarditis (again) ? ?S/p PPM system extraction/placement of a temp-perm 12/29/21 ?BC  from 12/25/21 neg for 5 days  ?BC drawn yesterday pending ? ?Temp-perm removed bedside by Dr. Lovena Le 12/30/21 noting no pacing support needed post procedure ? ?Patient yesterday evening with escalating pain at extraction site with noted swelling, managed with cold pack and pain meds ?HR stable creeping up overnight secondary to pain ?No brady events noted ?No plans for re-implant at this juncture ? ?Site this AM w/hematoma noted some extension towards axilla ?Pressure held for 20 minutes, patient tolerated very well ?Pressure dressing placed ? ?L IJ site is stable, no bleeding or hematoma ?R groin also stable, no bleeding or hematoma ? ?EP follow up for pressure dressing removal will be arranged ?Discharge with dressing in place ?OK to send home from EP since ready medically otherwise ? ?4. PAFib ?5. Age indeterminate DVT ?Planned for Eliquis. ?Initially thought to start 01/03/22, though please advise patient NOT to start until cleared by [place] at his dressing removal/site check visit ? ?For questions or updates, please contact [place] ?Please consult [email or web address] for contact info under  ? ?Signed, ?Baldwin [place], PA-C  ?12/31/2021, 8:30 AM   ? ?EP Attending ? ?Patient seen and examined. Agree with above. The patient has had some bleeding under his PM pocket. He has had a pressure dressing placed. His Eliquis will be held. His DVT appears to be old. He can be discharged home when his IV access is obtained. I would not recommend placement of another PPM at this time.   ? ?Salome Spotted ? ?

## 2021-12-31 NOTE — Care Management Important Message (Signed)
Important Message ? ?Patient Details  ?Name: Jason Jarvis ?MRN: [id number] ?Date of Birth: [birth year] ? ? ?Medicare Important Message Given:  Yes ? ? ? ? ?Shelda Altes ?12/31/2021, 8:25 AM ?

## 2022-01-01 DIAGNOSIS — R6520 Severe sepsis without septic shock: Secondary | ICD-10-CM | POA: Diagnosis not present

## 2022-01-01 DIAGNOSIS — A419 Sepsis, unspecified organism: Secondary | ICD-10-CM | POA: Diagnosis not present

## 2022-01-01 LAB — GLUCOSE, CAPILLARY
Glucose-Capillary: 161 mg/dL — ABNORMAL HIGH (ref 70–99)
Glucose-Capillary: 247 mg/dL — ABNORMAL HIGH (ref 70–99)

## 2022-01-01 LAB — PROTIME-INR
INR: 1.1 (ref 0.8–1.2)
Prothrombin Time: 14 seconds (ref 11.4–15.2)

## 2022-01-01 MED ORDER — TRAMADOL HCL 50 MG PO TABS: 50.0000 mg | ORAL_TABLET | Freq: Two times a day (BID) | ORAL | 0 refills | Status: DC | PRN

## 2022-01-01 MED ORDER — ACETAMINOPHEN 500 MG PO TABS: 1000.0000 mg | ORAL_TABLET | Freq: Four times a day (QID) | ORAL | 2 refills | Status: AC | PRN

## 2022-01-01 NOTE — Progress Notes (Signed)
Physical Therapy Treatment ?Patient Details ?Name: Jason Jarvis ?MRN: [id number] ?DOB: 02-22-1951 ?Today's Date: 01/01/2022 ? ? ?History of Present Illness 71 y.o. male adm 4/15 for sepsis, bacteremia, scrotal pain, AMs. Pt with PPM endocarditis. 4/23 DDD PM system extraction and insertion of a temp/perm single chamber pacemaker. PMHx: COPD, CAD, remote sz, nephrolithiasis, chronic kidney cyst stage II, paroxysmal Afib, CHF, PPM, renal cell CA, left TKA ? ?  ?PT Comments  ? ? Upon entry, pt reporting low back pain and stiffness from being in bed, motivated and grateful to get OOB with PT. Pt with noted increased trunk and lower extremity stiffness resulting in mild balance deficits this date, needing up to minA intermittently to maintain balance ambulating with a SPC. I suspect as he is more regularly mobilizing at home his stiffness and thus fluidity and balance with mobility will improve and he will quickly progress to his baseline. Pt displays good comprehension of his UE precautions. Educated pt and wife on pacemaker precautions handout. Will continue to follow acutely. Current recommendations remain appropriate. ? ?   ?Recommendations for follow up therapy are one component of a multi-disciplinary discharge planning process, led by the attending physician.  Recommendations may be updated based on patient status, additional functional criteria and insurance authorization. ? ?Follow Up Recommendations ? No PT follow up ?  ?  ?Assistance Recommended at Discharge Intermittent Supervision/Assistance  ?Patient can return home with the following A little help with bathing/dressing/bathroom;A little help with walking and/or transfers;Help with stairs or ramp for entrance;Assistance with cooking/housework ?  ?Equipment Recommendations ? None recommended by PT  ?  ?Recommendations for Other Services   ? ? ?  ?Precautions / Restrictions Precautions ?Precautions: Fall;ICD/Pacemaker;Other (comment) ?Precaution Comments: RUE  heart cath 4/23, pacemaker precautions LUE 4/23 (provided handout) ?Restrictions ?Other Position/Activity Restrictions: RUE heart cath 4/23, pacemaker precautions LUE 4/23 (provided handout)  ?  ? ?Mobility ? Bed Mobility ?Overal bed mobility: Needs Assistance ?Bed Mobility: Rolling, Sidelying to Sit ?Rolling: Min guard ?Sidelying to sit: Min guard ?  ?  ?  ?General bed mobility comments: Extra time and cues to roll to side and sit up from there, min guard for safety, extra effort noted. ?  ? ?Transfers ?Overall transfer level: Needs assistance ?Equipment used: Straight cane ?Transfers: Sit to/from Stand ?Sit to Stand: Min guard, From elevated surface ?  ?  ?  ?  ?  ?General transfer comment: Extra time and effort coming to stand without pushing through UEs, min guard for safety from elevated EOB ?  ? ?Ambulation/Gait ?Ambulation/Gait assistance: Min guard, Min assist ?Gait Distance (Feet): 200 Feet ?Assistive device: Straight cane ?Gait Pattern/deviations: Step-through pattern, Decreased stride length, Knee flexed in stance - right, Knee flexed in stance - left, Trunk flexed, Decreased dorsiflexion - right ?Gait velocity: decreased ?Gait velocity interpretation: <1.8 ft/sec, indicate of risk for recurrent falls ?  ?General Gait Details: Pt with anterior and lateral trunk flexion with decreased R ankle dorsiflexion noted today and bil knees flexed. Pt dragging SPC intermittently with L UE. Minor lateral staggering noted, especially when changing head positions, minA to maintain balance on occasion but otherwise min guard assist majority of time. ? ? ?Stairs ?  ?  ?  ?  ?  ? ? ?Wheelchair Mobility ?  ? ?Modified Rankin (Stroke Patients Only) ?  ? ? ?  ?Balance Overall balance assessment: Needs assistance ?Sitting-balance support: No upper extremity supported, Feet supported ?Sitting balance-Leahy Scale: Fair ?  ?  ?Standing balance support:  Single extremity supported, During functional activity ?Standing  balance-Leahy Scale: Poor ?Standing balance comment: Reliant on 1 UE support and min guard-minA ?  ?  ?  ?  ?  ?  ?  ?  ?  ?  ?  ?  ? ?  ?Cognition Arousal/Alertness: Awake/alert ?Behavior During Therapy: [facility] for tasks assessed/performed ?Overall Cognitive Status: Within Functional Limits for tasks assessed ?  ?  ?  ?  ?  ?  ?  ?  ?  ?  ?  ?  ?  ?  ?  ?  ?  ?  ?  ? ?  ?Exercises   ? ?  ?General Comments General comments (skin integrity, edema, etc.): HR up to 120s with gait; educated pt and wife on UE precautions and to assist pt as needed initially upon going home to ensure safety as he mobilizes more and ideally returns to his baseline ?  ?  ? ?Pertinent Vitals/Pain Pain Assessment ?Pain Assessment: Faces ?Faces Pain Scale: Hurts little more ?Pain Location: back, arms ?Pain Descriptors / Indicators: Discomfort, Grimacing, Guarding ?Pain Intervention(s): Limited activity within patient's tolerance, Monitored during session, Repositioned  ? ? ?Home Living   ?  ?  ?  ?  ?  ?  ?  ?  ?  ?   ?  ?Prior Function    ?  ?  ?   ? ?PT Goals (current goals can now be found in the care plan section) Acute Rehab PT Goals ?Patient Stated Goal: to go home today ?PT Goal Formulation: With patient/family ?Time For Goal Achievement: 01/08/22 ?Potential to Achieve Goals: Good ?Progress towards PT goals: Progressing toward goals ? ?  ?Frequency ? ? ? Min 3X/week ? ? ? ?  ?PT Plan Current plan remains appropriate  ? ? ?Co-evaluation   ?  ?  ?  ?  ? ?  ?AM-PAC PT "6 Clicks" Mobility   ?Outcome Measure ? Help needed turning from your back to your side while in a flat bed without using bedrails?: A Little ?Help needed moving from lying on your back to sitting on the side of a flat bed without using bedrails?: A Little ?Help needed moving to and from a bed to a chair (including a wheelchair)?: A Little ?Help needed standing up from a chair using your arms (e.g., wheelchair or bedside chair)?: A Little ?Help needed to walk in hospital room?:  A Little ?Help needed climbing 3-5 steps with a railing? : A Little ?6 Click Score: 18 ? ?  ?End of Session Equipment Utilized During Treatment: Gait belt ?Activity Tolerance: Patient tolerated treatment well ?Patient left: in chair;with call bell/phone within reach;with family/visitor present ?Nurse Communication: Mobility status ?PT Visit Diagnosis: Other abnormalities of gait and mobility (R26.89);Difficulty in walking, not elsewhere classified (R26.2);Unsteadiness on feet (R26.81) ?  ? ? ?Time: 4503-8882 ?PT Time Calculation (min) (ACUTE ONLY): 25 min ? ?Charges:  $Gait Training: 8-22 mins ?$Therapeutic Activity: 8-22 mins          ?          ? ?Moishe Spice, PT, DPT ?Acute Rehabilitation Services  ?Pager: [phone] ?Office: [phone] ? ? ? ?Maretta Bees Pettis ?01/01/2022, 10:06 AM ? ?

## 2022-01-01 NOTE — Discharge Summary (Signed)
Physician Discharge Summary  ?Jason Jarvis MMH:[id number] DOB: [birth year] DOA: 12/21/2021 ? ?PCP: Jeni Salles, MD ? ?Admit date: 12/21/2021 ?Discharge date: 01/01/2022 ? ?Time spent: 36 minutes ? ?Recommendations for Outpatient Follow-up:  ?Patient needs antibiotics as per infectious disease and to follow-up with both cardiology and infectious disease physician ?Recommend Chem-12 CBC CRP and labs as per infectious disease specialist ?Wound check to be done on 4/28 at A-fib clinic ? ?Discharge Diagnoses:  ?MAIN problem for hospitalization  ? ?Bacteremia from infected pacemaker lead-end date of antibiotics is 5/21 ? ?Please see below for itemized issues addressed in HOpsital- ?refer to other progress notes for clarity if needed ? ?Discharge Condition: Improved ? ?Diet recommendation: Heart healthy ? ?Filed Weights  ? 12/30/21 0456 12/31/21 0424 01/01/22 0503  ?Weight: 90.6 kg 92.5 kg 88 kg  ? ? ?History of present illness:  ?71 year old community dwelling white male paroxysmal A-fib not on anticoagulation + bradycardia?  Sick sinus syndrome + PPM-prior MRSA bacteremia 2019 ?Renal cell CA status post cryoablation 2017 ?Left knee replacement ?COPD CKD 2 seizures no longer on AEDs  ?Presented to [facility] looking sick on 4/16 ? ?Eventual work-up showed that patient had a bacteremia and infectious disease as well as cardiology was consulted ?Events ?4/15: started on Merem and admitted for sepsis ?4/16: US scrotum remarkable for microcalcifications on R testicle without abscess, fluid or sign of infection --> blood cultures positive for enterococcus; CT left foot normal; [place] LE shows age indeterminate DVT in left posterior tib; ID consulted, switched to ampicillin ?4/17: TTE with EF 50%, grade I DD ?4/20: TEE shows vegetation on pacer lead ?4/23: Pacer removed ?4/25: PICC line placed ?  ? ?Hospital Course:  ?Severe sepsis secondary to enterococcal bacteremia-on admission respiratory rate 38  encephalopathic and had leukocytosis ?TEE 4/20 showed vegetation ?Explantation performed 4/23 Dr. Lovena Le ?ID consulted and will require IV ampicillin/Rocephin as above until 5/21 via PICC line which should be pulled subsequent to visit with ID or cardiologist ?Pacemaker pocket hematoma pacemaker extraction 4/23 ?Defer to cardiology ?Metoprolol stopped-anticoagulation held until 5 days from removal ?Patient to have compression dressing in place until 4/28 ?Sick sinus syndrome + longstanding paroxysmal A-fib ?Patient was on Lovenox transiently and started on Eliquis newly this admission ?Nodal agents were held secondary to bradycardia ?Because of hematoma Eliquis will be resumed 5 days after explantation 4/28 as above ?CAD ?Aspirin stopped this admission as is on anticoagulation currently ?New right tibial DVT ?Continue anticoagulation as above ?Metabolic encephalopathy on admit ?Resolved completely secondary to sepsis ?Prior scrotal hematoma with RCCA in 2017 ?Follow-up with PCP and [facility]-secondary to microcalcifications ? ? ? ?Consultations: ? ? ?Discharge Exam: ?Vitals:  ? 12/31/21 2036 01/01/22 0407  ?BP: (!) 188/73 (!) 187/85  ?Pulse: 83 83  ?Resp: 20 20  ?Temp: 99.1 ?F (37.3 ?C) 99.3 ?F (37.4 ?C)  ?SpO2: 98% 96%  ? ? ?Subj on day of d/c ?  ? no focal deficit seems comfortable asking for prescription for pain meds ? ?General Exam on discharge ? ?EOMI NCAT no focal deficit CTA B no added sound no rales no rhonchi ?S1-S2 no murmur no rub no gallop abdomen soft nontender no rebound no guarding ?Neurologically intact no focal deficit ?No lower extremity edema ?Psych euthymic coherent ? ? ?Discharge Instructions ? ? ?Discharge Instructions   ? ? [facility] pharmacist to adjust dose for Vancomycin, Aminoglycosides and other anti-infective therapies as requested by physician.   Complete by: As directed ?  ?  [facility] to provide Cath Flo 66m   Complete by: As directed ?   ? Administer for PICC line occlusion and as ordered by physician for other access device issues.  ? Anaphylaxis Kit: Provided to treat any anaphylactic reaction to the medication being provided to the patient if First Dose or when requested by physician   Complete by: As directed ?  ? Epinephrine 167mml vial / amp: Administer 0.6m51m0.6ml49mubcutaneously once for moderate to severe anaphylaxis, nurse to call physician and pharmacy when reaction occurs and call 911 if needed for immediate care  ? Diphenhydramine 50mg59mIV vial: Administer 25-50mg 86mM PRN for first dose reaction, rash, itching, mild reaction, nurse to call physician and pharmacy when reaction occurs  ? Sodium Chloride 0.9% NS 500ml I37mdminister if needed for hypovolemic blood pressure drop or as ordered by physician after call to physician with anaphylactic reaction  ? Change dressing on IV access line weekly and PRN   Complete by: As directed ?  ? Diet - low sodium heart healthy   Complete by: As directed ?  ? Diet - low sodium heart healthy   Complete by: As directed ?  ? Discharge instructions   Complete by: As directed ?  ? From Dr. DanfordLoleta Bookswere admitted for fever, confusion and severe infection. ?It turned out this was sepsis from an infection by Enterococcus faecalis in your bloodstream. ?Unfortunately, when they did the ultrasound in the esophagus, it showed that there was a bit of infection (we call this a "vegetation") on your pacemaker lead. ? ?Dr. Taylor Lovena Led the pacemaker. ?Leave the dressing on your chest until Friday. ?Go see Dr. Taylor'Tanna Furry on Friday at 10am to have them check your site. ?They will tell you what to do with the Eliquis/apixaban (the blood thinner) ? ?For now: ?STOP aspirin  ?STOP metoprolol ? ?Do not take either one. ?Resume your other home medicines ? ?Schedule a follow up appointment with your primary care doctor in 1 week ? ?Take the ampicilling and Rocephin via the IV at home. ?I recommend you go  see the infectious doctor who you saw here (Dr. Trung VPrudencio Pair [facility] ?Their office number is below and you can call for an appointment in about 1 month  ? Discharge instructions   Complete by: As directed ?  ? You will get home health nursing to help with discharge medications including your IV antibiotics that have been required-you will also get teaching by them in terms of how to give yourself the antibiotics  ? Discharge wound care:   Complete by: As directed ?  ? As directed by Cardiology  ? Flush IV access with Sodium Chloride 0.9% and Heparin 10 units/ml or 100 units/ml   Complete by: As directed ?  ? Home infusion instructions - [facility]   Complete by: As directed ?  ? Instructions: Flush IV access with Sodium Chloride 0.9% and Heparin 10units/ml or 100units/ml  ? Change dressing on IV access line: Weekly and PRN  ? Instructions Cath Flo 2mg: Ad34mister for PICC Line occlusion and as ordered by physician for other access device  ? [facility] pharmacist to adjust dose for: Vancomycin, Aminoglycosides and other anti-infective therapies as requested by physician  ? Increase activity slowly   Complete by: As directed ?  ? Increase activity slowly   Complete by: As directed ?  ? Method of administration may be  changed at the discretion of home infusion pharmacist based upon assessment of the patient and/or caregiver?s ability to self-administer the medication ordered   Complete by: As directed ?  ? No wound care   Complete by: As directed ?  ? ?  ? ?Allergies as of 01/01/2022   ? ?   Reactions  ? Cephalexin Hives  ? Lorazepam Other (See Comments)  ? Hallucinated / Confused / Combative  ? Ace Inhibitors Cough  ?   ? Prednisone Rash  ? Sulfa Antibiotics Rash  ? ?  ? ?  ?Medication List  ?  ? ?STOP taking these medications   ? ?aspirin EC 81 MG tablet ?  ?HYDROcodone-acetaminophen 5-325 MG tablet ?Commonly known as: NORCO/VICODIN ?   ?metoprolol succinate 50 MG 24 hr tablet ?Commonly known as: TOPROL-XL ?  ? ?  ? ?TAKE these medications   ? ?acetaminophen 500 MG tablet ?Commonly known as: TYLENOL ?Take 2 tablets (1,000 mg total) by mouth every 6 (six)

## 2022-01-02 ENCOUNTER — Encounter (HOSPITAL_COMMUNITY): Payer: Self-pay | Admitting: Internal Medicine

## 2022-01-02 ENCOUNTER — Telehealth: Payer: Self-pay

## 2022-01-02 NOTE — Telephone Encounter (Signed)
LVM for patient to device clinic back to re-schedule 10:00 AM apt in 01/03/22 to the afternoon  ?

## 2022-01-03 ENCOUNTER — Ambulatory Visit: Payer: Medicare HMO

## 2022-01-03 DIAGNOSIS — Z950 Presence of cardiac pacemaker: Secondary | ICD-10-CM

## 2022-01-03 NOTE — Progress Notes (Signed)
Pressure dressing removed, steri-strips intact. Healing stages of bruising noted. ?Patient and patients wife instructed not to get steri-strips wet until wound check on 01/14/22 ? ? ? ?

## 2022-01-04 LAB — CULTURE, BLOOD (ROUTINE X 2)
Culture: NO GROWTH
Culture: NO GROWTH
Special Requests: ADEQUATE
Special Requests: ADEQUATE

## 2022-01-06 ENCOUNTER — Other Ambulatory Visit: Payer: Self-pay | Admitting: *Deleted

## 2022-01-06 ENCOUNTER — Telehealth: Payer: Self-pay | Admitting: *Deleted

## 2022-01-06 DIAGNOSIS — Z79899 Other long term (current) drug therapy: Secondary | ICD-10-CM

## 2022-01-06 MED ORDER — APIXABAN 5 MG PO TABS
5.0000 mg | ORAL_TABLET | Freq: Two times a day (BID) | ORAL | 1 refills | Status: DC
Start: 2022-01-06 — End: 2022-01-15

## 2022-01-06 NOTE — Telephone Encounter (Signed)
Spoke with patient wife who stated its better to call on her cell due to patient hearing. Patient wife stated patient doing okay at home and per last wound check visit was healing appropriately and only dealing with typical post  pain around site. Pt wife was told that provider Tommye Standard is recommending starting blood thinner Eliquis and will contact back with exact time to start before next  wound check visit 01-14-22. Pt wife stated she agrees with patient dealing with blood clots and will be awaiting a phone cal lon her cell not home number. ?

## 2022-01-12 ENCOUNTER — Other Ambulatory Visit: Payer: Self-pay

## 2022-01-12 ENCOUNTER — Encounter (HOSPITAL_COMMUNITY): Payer: Self-pay | Admitting: Emergency Medicine

## 2022-01-12 ENCOUNTER — Telehealth: Payer: Self-pay | Admitting: Physician Assistant

## 2022-01-12 ENCOUNTER — Inpatient Hospital Stay (HOSPITAL_COMMUNITY)
Admission: EM | Admit: 2022-01-12 | Discharge: 2022-01-15 | DRG: 920 | Disposition: A | Discharge status: Home / Self Care | Payer: Medicare Other | Attending: Family Medicine | Admitting: Family Medicine

## 2022-01-12 DIAGNOSIS — M1711 Unilateral primary osteoarthritis, right knee: Secondary | ICD-10-CM | POA: Diagnosis present

## 2022-01-12 DIAGNOSIS — Y838 Other surgical procedures as the cause of abnormal reaction of the patient, or of later complication, without mention of misadventure at the time of the procedure: Secondary | ICD-10-CM | POA: Diagnosis present

## 2022-01-12 DIAGNOSIS — I97618 Postprocedural hemorrhage and hematoma of a circulatory system organ or structure following other circulatory system procedure: Secondary | ICD-10-CM | POA: Diagnosis not present

## 2022-01-12 DIAGNOSIS — Y831 Surgical operation with implant of artificial internal device as the cause of abnormal reaction of the patient, or of later complication, without mention of misadventure at the time of the procedure: Secondary | ICD-10-CM | POA: Diagnosis not present

## 2022-01-12 DIAGNOSIS — Z888 Allergy status to other drugs, medicaments and biological substances status: Secondary | ICD-10-CM

## 2022-01-12 DIAGNOSIS — L7632 Postprocedural hematoma of skin and subcutaneous tissue following other procedure: Secondary | ICD-10-CM | POA: Diagnosis present

## 2022-01-12 DIAGNOSIS — J449 Chronic obstructive pulmonary disease, unspecified: Secondary | ICD-10-CM | POA: Diagnosis present

## 2022-01-12 DIAGNOSIS — Z7901 Long term (current) use of anticoagulants: Secondary | ICD-10-CM

## 2022-01-12 DIAGNOSIS — Z7951 Long term (current) use of inhaled steroids: Secondary | ICD-10-CM

## 2022-01-12 DIAGNOSIS — I341 Nonrheumatic mitral (valve) prolapse: Secondary | ICD-10-CM | POA: Diagnosis not present

## 2022-01-12 DIAGNOSIS — E1169 Type 2 diabetes mellitus with other specified complication: Secondary | ICD-10-CM | POA: Diagnosis not present

## 2022-01-12 DIAGNOSIS — Z7409 Other reduced mobility: Secondary | ICD-10-CM | POA: Insufficient documentation

## 2022-01-12 DIAGNOSIS — Z79899 Other long term (current) drug therapy: Secondary | ICD-10-CM

## 2022-01-12 DIAGNOSIS — I480 Paroxysmal atrial fibrillation: Secondary | ICD-10-CM | POA: Diagnosis present

## 2022-01-12 DIAGNOSIS — E7800 Pure hypercholesterolemia, unspecified: Secondary | ICD-10-CM | POA: Diagnosis not present

## 2022-01-12 DIAGNOSIS — I495 Sick sinus syndrome: Secondary | ICD-10-CM | POA: Diagnosis not present

## 2022-01-12 DIAGNOSIS — I110 Hypertensive heart disease with heart failure: Secondary | ICD-10-CM | POA: Diagnosis not present

## 2022-01-12 DIAGNOSIS — T148XXA Other injury of unspecified body region, initial encounter: Secondary | ICD-10-CM | POA: Diagnosis present

## 2022-01-12 DIAGNOSIS — R7881 Bacteremia: Secondary | ICD-10-CM | POA: Diagnosis not present

## 2022-01-12 DIAGNOSIS — I252 Old myocardial infarction: Secondary | ICD-10-CM

## 2022-01-12 DIAGNOSIS — Z950 Presence of cardiac pacemaker: Secondary | ICD-10-CM

## 2022-01-12 DIAGNOSIS — Y713 Surgical instruments, materials and cardiovascular devices (including sutures) associated with adverse incidents: Secondary | ICD-10-CM | POA: Diagnosis not present

## 2022-01-12 DIAGNOSIS — T82837A Hemorrhage of cardiac prosthetic devices, implants and grafts, initial encounter: Secondary | ICD-10-CM

## 2022-01-12 DIAGNOSIS — I2510 Atherosclerotic heart disease of native coronary artery without angina pectoris: Secondary | ICD-10-CM | POA: Diagnosis not present

## 2022-01-12 DIAGNOSIS — I69322 Dysarthria following cerebral infarction: Secondary | ICD-10-CM | POA: Diagnosis not present

## 2022-01-12 DIAGNOSIS — T45515A Adverse effect of anticoagulants, initial encounter: Secondary | ICD-10-CM | POA: Diagnosis present

## 2022-01-12 DIAGNOSIS — N400 Enlarged prostate without lower urinary tract symptoms: Secondary | ICD-10-CM | POA: Diagnosis not present

## 2022-01-12 DIAGNOSIS — T8131XA Disruption of external operation (surgical) wound, not elsewhere classified, initial encounter: Secondary | ICD-10-CM | POA: Diagnosis present

## 2022-01-12 DIAGNOSIS — Z66 Do not resuscitate: Secondary | ICD-10-CM | POA: Diagnosis not present

## 2022-01-12 DIAGNOSIS — Z794 Long term (current) use of insulin: Secondary | ICD-10-CM

## 2022-01-12 DIAGNOSIS — E1142 Type 2 diabetes mellitus with diabetic polyneuropathy: Secondary | ICD-10-CM | POA: Diagnosis present

## 2022-01-12 DIAGNOSIS — I824Z2 Acute embolism and thrombosis of unspecified deep veins of left distal lower extremity: Secondary | ICD-10-CM | POA: Diagnosis not present

## 2022-01-12 DIAGNOSIS — I452 Bifascicular block: Secondary | ICD-10-CM | POA: Diagnosis present

## 2022-01-12 DIAGNOSIS — E611 Iron deficiency: Secondary | ICD-10-CM | POA: Diagnosis present

## 2022-01-12 DIAGNOSIS — T827XXS Infection and inflammatory reaction due to other cardiac and vascular devices, implants and grafts, sequela: Secondary | ICD-10-CM

## 2022-01-12 DIAGNOSIS — Z86718 Personal history of other venous thrombosis and embolism: Secondary | ICD-10-CM

## 2022-01-12 DIAGNOSIS — I69354 Hemiplegia and hemiparesis following cerebral infarction affecting left non-dominant side: Secondary | ICD-10-CM

## 2022-01-12 DIAGNOSIS — K219 Gastro-esophageal reflux disease without esophagitis: Secondary | ICD-10-CM | POA: Diagnosis present

## 2022-01-12 DIAGNOSIS — D6832 Hemorrhagic disorder due to extrinsic circulating anticoagulants: Secondary | ICD-10-CM | POA: Diagnosis present

## 2022-01-12 DIAGNOSIS — R001 Bradycardia, unspecified: Secondary | ICD-10-CM | POA: Diagnosis present

## 2022-01-12 DIAGNOSIS — B952 Enterococcus as the cause of diseases classified elsewhere: Secondary | ICD-10-CM | POA: Diagnosis not present

## 2022-01-12 DIAGNOSIS — Z85528 Personal history of other malignant neoplasm of kidney: Secondary | ICD-10-CM

## 2022-01-12 DIAGNOSIS — I5022 Chronic systolic (congestive) heart failure: Secondary | ICD-10-CM | POA: Diagnosis present

## 2022-01-12 DIAGNOSIS — Z882 Allergy status to sulfonamides status: Secondary | ICD-10-CM

## 2022-01-12 DIAGNOSIS — E785 Hyperlipidemia, unspecified: Secondary | ICD-10-CM

## 2022-01-12 DIAGNOSIS — T827XXA Infection and inflammatory reaction due to other cardiac and vascular devices, implants and grafts, initial encounter: Secondary | ICD-10-CM | POA: Diagnosis present

## 2022-01-12 DIAGNOSIS — I10 Essential (primary) hypertension: Secondary | ICD-10-CM | POA: Diagnosis present

## 2022-01-12 DIAGNOSIS — Z881 Allergy status to other antibiotic agents status: Secondary | ICD-10-CM

## 2022-01-12 DIAGNOSIS — M62838 Other muscle spasm: Secondary | ICD-10-CM | POA: Diagnosis present

## 2022-01-12 HISTORY — DX: Presence of cardiac pacemaker: Z95.0

## 2022-01-12 HISTORY — DX: Other injury of unspecified body region, initial encounter: T14.8XXA

## 2022-01-12 HISTORY — DX: Hemorrhage due to cardiac prosthetic devices, implants and grafts, initial encounter: T82.837A

## 2022-01-12 LAB — CBC WITH DIFFERENTIAL/PLATELET
Abs Immature Granulocytes: 0.06 10*3/uL (ref 0.00–0.07)
Basophils Absolute: 0 10*3/uL (ref 0.0–0.1)
Basophils Relative: 1 %
Eosinophils Absolute: 0.2 10*3/uL (ref 0.0–0.5)
Eosinophils Relative: 5 %
HCT: 29.8 % — ABNORMAL LOW (ref 39.0–52.0)
Hemoglobin: 9.4 g/dL — ABNORMAL LOW (ref 13.0–17.0)
Immature Granulocytes: 1 %
Lymphocytes Relative: 25 %
Lymphs Abs: 1 10*3/uL (ref 0.7–4.0)
MCH: 26.9 pg (ref 26.0–34.0)
MCHC: 31.5 g/dL (ref 30.0–36.0)
MCV: 85.4 fL (ref 80.0–100.0)
Monocytes Absolute: 0.4 10*3/uL (ref 0.1–1.0)
Monocytes Relative: 10 %
Neutro Abs: 2.4 10*3/uL (ref 1.7–7.7)
Neutrophils Relative %: 58 %
Platelets: 233 10*3/uL (ref 150–400)
RBC: 3.49 MIL/uL — ABNORMAL LOW (ref 4.22–5.81)
RDW: 14.8 % (ref 11.5–15.5)
WBC: 4.2 10*3/uL (ref 4.0–10.5)
nRBC: 0 % (ref 0.0–0.2)

## 2022-01-12 LAB — BASIC METABOLIC PANEL
Anion gap: 9 (ref 5–15)
BUN: 9 mg/dL (ref 8–23)
CO2: 26 mmol/L (ref 22–32)
Calcium: 8.4 mg/dL — ABNORMAL LOW (ref 8.9–10.3)
Chloride: 104 mmol/L (ref 98–111)
Creatinine, Ser: 0.99 mg/dL (ref 0.61–1.24)
GFR, Estimated: >60 mL/min (ref >60)
Glucose, Bld: 161 mg/dL — ABNORMAL HIGH (ref 70–99)
Potassium: 3.7 mmol/L (ref 3.5–5.1)
Sodium: 139 mmol/L (ref 135–145)

## 2022-01-12 LAB — PROTIME-INR
INR: 1.2 (ref 0.8–1.2)
Prothrombin Time: 15 seconds (ref 11.4–15.2)

## 2022-01-12 LAB — TYPE AND SCREEN
ABO/RH(D): O NEG
Antibody Screen: NEGATIVE

## 2022-01-12 LAB — GLUCOSE, CAPILLARY
Glucose-Capillary: 173 mg/dL — ABNORMAL HIGH (ref 70–99)
Glucose-Capillary: 121 mg/dL — ABNORMAL HIGH (ref 70–99)

## 2022-01-12 MED ORDER — POTASSIUM CHLORIDE CRYS ER 10 MEQ PO TBCR
30.0000 meq | EXTENDED_RELEASE_TABLET | Freq: Every day | ORAL | Status: DC
  Administered 2022-01-13 – 2022-01-15 (×3): 30 meq via ORAL
  Filled 2022-01-12 (×4): qty 3

## 2022-01-12 MED ORDER — POLYSACCHARIDE IRON COMPLEX 150 MG PO CAPS
150.0000 mg | ORAL_CAPSULE | Freq: Every day | ORAL | Status: DC
Start: 2022-01-12 — End: 2022-01-15
  Administered 2022-01-12 – 2022-01-14 (×3): 150 mg via ORAL
  Filled 2022-01-12 (×4): qty 1

## 2022-01-12 MED ORDER — FUROSEMIDE 40 MG PO TABS
40.0000 mg | ORAL_TABLET | Freq: Every day | ORAL | Status: DC
  Administered 2022-01-13 – 2022-01-15 (×3): 40 mg via ORAL
  Filled 2022-01-12 (×3): qty 1

## 2022-01-12 MED ORDER — MOMETASONE FURO-FORMOTEROL FUM 200-5 MCG/ACT IN AERO
2.0000 | INHALATION_SPRAY | Freq: Two times a day (BID) | RESPIRATORY_TRACT | Status: DC
  Administered 2022-01-12 – 2022-01-15 (×6): 2 via RESPIRATORY_TRACT
  Filled 2022-01-12: qty 8.8

## 2022-01-12 MED ORDER — CEFTRIAXONE IV (FOR PTA / DISCHARGE USE ONLY): 2.0000 g | Freq: Two times a day (BID) | INTRAVENOUS | Status: DC

## 2022-01-12 MED ORDER — INSULIN ASPART 100 UNIT/ML IJ SOLN
0.0000 [IU] | Freq: Three times a day (TID) | INTRAMUSCULAR | Status: DC
  Administered 2022-01-12 – 2022-01-13 (×3): 2 [IU] via SUBCUTANEOUS
  Administered 2022-01-14: 1 [IU] via SUBCUTANEOUS
  Administered 2022-01-15: 2 [IU] via SUBCUTANEOUS

## 2022-01-12 MED ORDER — SODIUM CHLORIDE 0.9 % IV SOLN
2.0000 g | INTRAVENOUS | Status: DC
  Administered 2022-01-12 – 2022-01-15 (×18): 2 g via INTRAVENOUS
  Filled 2022-01-12 (×20): qty 2000

## 2022-01-12 MED ORDER — ATORVASTATIN CALCIUM 40 MG PO TABS
40.0000 mg | ORAL_TABLET | Freq: Every day | ORAL | Status: DC
  Administered 2022-01-12 – 2022-01-14 (×3): 40 mg via ORAL
  Filled 2022-01-12 (×3): qty 1

## 2022-01-12 MED ORDER — AMLODIPINE BESYLATE 2.5 MG PO TABS
2.5000 mg | ORAL_TABLET | Freq: Every day | ORAL | Status: DC
  Administered 2022-01-13 – 2022-01-15 (×3): 2.5 mg via ORAL
  Filled 2022-01-12 (×3): qty 1

## 2022-01-12 MED ORDER — AMPICILLIN IV (FOR PTA / DISCHARGE USE ONLY): 12.0000 g | INTRAVENOUS | Status: DC

## 2022-01-12 MED ORDER — LOSARTAN POTASSIUM 50 MG PO TABS
50.0000 mg | ORAL_TABLET | Freq: Every day | ORAL | Status: DC
  Administered 2022-01-13 – 2022-01-14 (×2): 50 mg via ORAL
  Filled 2022-01-12 (×2): qty 1

## 2022-01-12 MED ORDER — PANTOPRAZOLE SODIUM 40 MG PO TBEC
40.0000 mg | DELAYED_RELEASE_TABLET | Freq: Two times a day (BID) | ORAL | Status: DC
  Administered 2022-01-12 – 2022-01-15 (×6): 40 mg via ORAL
  Filled 2022-01-12 (×6): qty 1

## 2022-01-12 MED ORDER — SODIUM CHLORIDE 0.9 % IV SOLN
2.0000 g | Freq: Two times a day (BID) | INTRAVENOUS | Status: DC
  Administered 2022-01-12 – 2022-01-15 (×6): 2 g via INTRAVENOUS
  Filled 2022-01-12 (×7): qty 20

## 2022-01-12 MED ORDER — DOXAZOSIN MESYLATE 2 MG PO TABS
2.0000 mg | ORAL_TABLET | Freq: Every day | ORAL | Status: DC
  Administered 2022-01-12 – 2022-01-14 (×3): 2 mg via ORAL
  Filled 2022-01-12 (×4): qty 1

## 2022-01-12 MED ORDER — INSULIN GLARGINE-YFGN 100 UNIT/ML ~~LOC~~ SOLN
15.0000 [IU] | Freq: Every day | SUBCUTANEOUS | Status: DC
  Administered 2022-01-12 – 2022-01-15 (×4): 15 [IU] via SUBCUTANEOUS
  Filled 2022-01-12 (×4): qty 0.15

## 2022-01-12 NOTE — H&P (Signed)
I have interviewed and examined the patient.  I have discussed the case with Dr. Jeani Hawking.   I agree with their documentation and management in their note for today.  ? ?Principal Problem: ?  Wound disruption, post-op, skin ?              - Pacemaker pocket hematoma ?              - Bleeding from wound stated today ?Active Problems: ?  Hyperlipidemia associated with type 2 diabetes mellitus ([place]) ?  Essential hypertension ?  Diabetic polyneuropathy associated with type 2 diabetes mellitus ([place]) ?  SSS (sick sinus syndrome) ([place]) ?                 - Paroxysmal atrial fibrillation ([facility]) ?  Recent Bacteremia due to Enterococcus ?                  - Pacemaker infection ([place]) ?                              - Pacemaker removed 12/29/21 ?  Deep vein thrombosis (DVT) of distal vein of left lower extremity of indeterminate age [facility]) ?                              - diagnosis 12/21/21.  DOAC restarted 01/07/22.  B ?   ?Consultation with Cardiology ?Monitor Hgb and Hemodynamics ?EDP applied dermabond to surgical wound leak site along prior sutures ?Holding DOAC ?Repeat venous doppler with condideration of mere monitoring DVT for progression/regression rather than use DOAC or other anticoagulation while waiting for surgical pocket to heal. ? ? ? LOS: 0 days   ? ?                      ?              ?For questions or updates, please contact Family Medicine Teaching Service ?Resident On-call at pager [phone] or  [email or web address] - see pager "[place]" ? ?

## 2022-01-12 NOTE — Progress Notes (Signed)
Family medicine teaching service will be admitting this patient. Our pager information can be located in the physician sticky notes, treatment team sticky notes, and the headers of all our official daily progress notes.   FAMILY MEDICINE TEACHING SERVICE Patient - Please contact intern pager [phone] or text page via website [email or web address] ([email or web address]: [email or web address]) for questions regarding care. DO NOT page listed attending provider unless there is no answer from the number above.   Shaira Sova, MD PGY-2, [facility] pager [phone]   

## 2022-01-12 NOTE — ED Notes (Signed)
Dermabond given to provider

## 2022-01-12 NOTE — Consult Note (Addendum)
?Cardiology Consultation:  ? ?Patient ID: Jason Jarvis ?MRN: [id number]; DOB: 03/10/1951 ? ?Admit date: 01/12/2022 ?Date of Consult: 01/12/2022 ? ?PCP:  Jason Salles, MD ?  ?[facility] Providers ?Cardiologist:  None      ? ? ?Patient Profile:  ? ?71 year old community dwelling white male paroxysmal A-fib not on anticoagulation , Sick sinus syndrome + PPM-prior MRSA bacteremia 2019 s/p PPM removal, repeat PPM implantation on the contralateral side in 2019, + e faecallis s/p PPM removal  by Jason Jarvis 2/2 vegetation 1 x 0.7 cm seen on TEE 12/26/2021 who is being seen 01/12/2022 for the evaluation of pocket hematoma at the request of Jason Jarvis. ? ?History of Present Illness:  ? ?Jason Jarvis has pmhx above. He presents after PPM removal recently 12/29/2021. He has hx of SSS. Here his rates are in the high 40s. His blood pressures are normal. He recently restarted his eliquis after the PPM removal, for hx of afib and he started to develop a L pocket hematoma and oozing blood. Hgb 11-> 9. He is afebrile. Pocket site does not look infected. He is otherwise stable. ? ?Just recently he was admitted for sepsis secondary to enterococcal bacteremia. He had a TEE that showed 1 x 0.7 cm vegetation. He had PPM which was removed by Jason Jarvis. He is planned to remain on IV antibiotics until 5/21 with IV ampicillin and rocephin. Noted to have a pocket hematoma on discharge 01/01/2022. Plan was to hold eliquis and not restart for 5 days. Wife notes they restarted on Tuesday and hematoma progressed with oozing blood. She called in today and Jason Jarvis recommended admission. ? ? ?Past Medical History:  ?Diagnosis Date  ? Arm DVT (deep venous thromboembolism), acute ([place])   ? Chronic kidney disease   ? Coronary artery disease   ? Hypertension   ? Myocardial infarction [facility])   ? pt states that he had a "light heart attack about 25 yrs ago"  ? Pacemaker   ? Pneumonia   ? Stroke [facility])   ? approx 15 yrs ago  ? ? ?Past Surgical  History:  ?Procedure Laterality Date  ? BUBBLE STUDY  12/26/2021  ? Procedure: BUBBLE STUDY;  Surgeon: Jason Munroe, MD;  Location: [place];  Service: Cardiovascular;;  ? IR GENERIC HISTORICAL  04/22/2016  ? IR RADIOLOGIST EVAL & MGMT 04/22/2016 Jason Keen, MD [facility] INTERV RAD  ? IR RADIOLOGIST EVAL & MGMT  05/18/2019  ? IR RADIOLOGIST EVAL & MGMT  07/06/2019  ? IR RADIOLOGIST EVAL & MGMT  10/26/2019  ? IR RADIOLOGIST EVAL & MGMT  10/30/2020  ? IR RADIOLOGIST EVAL & MGMT  11/01/2021  ? PACEMAKER IMPLANT  05/2017  ? PACEMAKER LEAD REMOVAL N/A 12/29/2021  ? Procedure: PACEMAKER LEAD REMOVAL;  Surgeon: Jason Lance, MD;  Location: [place];  Service: Cardiovascular;  Laterality: N/A;  ? PACEMAKER REMOVAL  07/2017  ? TEE WITHOUT CARDIOVERSION N/A 12/26/2021  ? Procedure: TRANSESOPHAGEAL ECHOCARDIOGRAM (TEE);  Surgeon: Jason Munroe, MD;  Location: [place];  Service: Cardiovascular;  Laterality: N/A;  ?  ? ? ? ?Inpatient Medications: ?Scheduled Meds: ? ?Continuous Infusions: ? ?PRN Meds: ? ? ?Allergies:    ?Allergies  ?Allergen Reactions  ? Ativan [Lorazepam] Other (See Comments)  ?  Hallucinated / Confused / Combative  ? Keflex [Cephalexin] Hives  ? Ace Inhibitors Cough  ?   ?  ? Prednisone Rash  ? Sulfa Antibiotics Rash  ? ? ?Social History:   ?Social History  ? ?  Socioeconomic History  ? Marital status: Married  ?  Spouse name: Not on file  ? Number of children: Not on file  ? Years of education: Not on file  ? Highest education level: Not on file  ?Occupational History  ? Not on file  ?Tobacco Use  ? Smoking status: Never  ? Smokeless tobacco: Never  ?Substance and Sexual Activity  ? Alcohol use: No  ?  Alcohol/week: 0.0 standard drinks  ? Drug use: No  ? Sexual activity: Not on file  ?Other Topics Concern  ? Not on file  ?Social History Narrative  ? Not on file  ? ?Social Determinants of Health  ? ?Financial Resource Strain: Not on file  ?Food Insecurity: Not on file  ?Transportation Needs: Not on file   ?Physical Activity: Not on file  ?Stress: Not on file  ?Social Connections: Not on file  ?Intimate Partner Violence: Not on file  ?  ?Family History:   ?No pertinent ? ?ROS:  ?Please see the history of present illness.  ?All other ROS reviewed and negative.    ? ?Physical Exam/Data:  ? ?Vitals:  ? 01/12/22 1208 01/12/22 1245  ?BP: (!) 156/73 138/62  ?Pulse: (!) 55 (!) 49  ?Resp: 16 18  ?Temp: 98.3 ?F (36.8 ?C)   ?TempSrc: Oral   ?SpO2: 97% 97%  ? ?No intake or output data in the 24 hours ending 01/12/22 1415 ? ?  01/01/2022  ?  5:03 AM 12/31/2021  ?  4:24 AM 12/30/2021  ?  4:56 AM  ?Last 3 Weights  ?Weight (lbs) 194 lb 0.1 oz 203 lb 14.8 oz 199 lb 11.8 oz  ?Weight (kg) 88 kg 92.5 kg 90.6 kg  ?   ?There is no height or weight on file to calculate BMI.  ? ?General: mildly pale ?HEENT: normal ?Vascular: No carotid bruits; Distal pulses 2+ bilaterally ?Cardiac: regular rates ?Lungs: nl wob ?Abd: soft, nontender, no hepatomegaly  ?Ext: no edema ?Musculoskeletal:  No Jarvis edema ?Skin: warm and dry  ?Neuro:  CNs 2-12 intact, no focal abnormalities noted ?Psych:  Normal affect  ? ?EKG:  The EKG was personally reviewed and demonstrates:  Sinus bradycardia HR 51, 1st degree AV block, RBBB, LAFB ? ?Telemetry:  Telemetry was personally reviewed and demonstrates:  sinus bradycardia, PACs ? ?Relevant CV Studies: ?TTE 12/23/2021  ?1. Left ventricular ejection fraction, by estimation, is 50%. The left  ?ventricle has mildly decreased function. The left ventricle demonstrates  ?global hypokinesis with septal-lateral dyssynchrony due to RV pacing.  ?There is mild left ventricular  ?hypertrophy. Left ventricular diastolic parameters are consistent with  ?Grade I diastolic dysfunction (impaired relaxation).  ? 2. Right ventricular systolic function is normal. The right ventricular  ?size is normal. There is normal pulmonary artery systolic pressure. The  ?estimated right ventricular systolic pressure is 37.9 mmHg.  ? 3. The mitral valve is  normal in structure. No evidence of mitral valve  ?regurgitation. No evidence of mitral stenosis.  ? 4. The aortic valve is tricuspid. Aortic valve regurgitation is not  ?visualized. No aortic stenosis is present.  ? 5. The inferior vena cava is normal in size with greater than 50%  ?respiratory variability, suggesting right atrial pressure of 3 mmHg.  ? ?Laboratory Data: ? ?High Sensitivity Troponin:  No results for input(s): TROPONINIHS in the last 720 hours.   ?Chemistry ?Recent Labs  ?Lab 01/12/22 ?1239  ?NA 139  ?K 3.7  ?CL 104  ?CO2 26  ?GLUCOSE 161*  ?  BUN 9  ?CREATININE 0.99  ?CALCIUM 8.4*  ?GFRNONAA >60  ?ANIONGAP 9  ?  ?No results for input(s): PROT, ALBUMIN, AST, ALT, ALKPHOS, BILITOT in the last 168 hours. ?Lipids No results for input(s): CHOL, TRIG, HDL, LABVLDL, LDLCALC, CHOLHDL in the last 168 hours.  ?Hematology ?Recent Labs  ?Lab 01/12/22 ?1239  ?WBC 4.2  ?RBC 3.49*  ?HGB 9.4*  ?HCT 29.8*  ?MCV 85.4  ?MCH 26.9  ?MCHC 31.5  ?RDW 14.8  ?PLT 233  ? ?Thyroid No results for input(s): TSH, FREET4 in the last 168 hours.  ?BNPNo results for input(s): BNP, PROBNP in the last 168 hours.  ?DDimer No results for input(s): DDIMER in the last 168 hours. ? ? ?Radiology/Studies:  ?No results found. ? ? ?Assessment and Plan:  ? ?#Pocket Hematoma: patient s/p PPM removal. Has hx of SSS. He is tolerating sinus bradycardia currently. Will plan to continue to hold his eliquis. In the ED, they are using quick clot and holding pressure.Plan for him to be seen by EP, if bleeding does not cease, may benefit from a washout. Continue antibiotics 2/2 previous device vegetation. ? ?#SSS/Afib: tolerating bradycardia ?- avoid AV nodal blocking agents ?- hold eliquis per above  ? ? ? ?Risk Assessment/Risk Scores:  ?   ? ?For questions or updates, please contact [facility] ?Please consult [email or web address] for contact info under  ? ? ?Signed, ?Janina Mayo, MD  ?01/12/2022 2:15 PM ? ?

## 2022-01-12 NOTE — ED Provider Notes (Signed)
?[place] ?Provider Note ? ? ?CSN: [id number] ?Arrival date & time: 01/12/22  1202 ? ?  ? ?History ? ?Chief Complaint  ?Patient presents with  ? Post-op bleeding  ? ? ?Jason Jarvis is a 71 y.o. male with a past medical history of bradycardia S/P infection x3 who had his most recent pacemaker removed 4/23 presenting today with bleeding from the surgical site.  His wife reports that there were no problems with the removal and that he was restarted on his Eliquis on Tuesday 5/2.  This morning they started to notice bleeding from the pacemaker site and that the swelling to the area was increasing.  Patient reports the pain is a 8 out of 10 on palpation however 2 out of 10 when nobody is touching it.  Last Eliquis dose this morning. ? ?HPI ? ?  ? ?Home Medications ?Prior to Admission medications   ?Medication Sig Start Date End Date Taking? Authorizing Provider  ?acetaminophen (TYLENOL) 500 MG tablet Take 2 tablets (1,000 mg total) by mouth every 6 (six) hours as needed. 01/01/22 01/01/23  Nita Sells, MD  ?amLODipine (NORVASC) 2.5 MG tablet  01/23/16   [provider]  ?ampicillin IVPB Inject 12 g into the vein daily for 27 days. As a continuous infusion. Indication:  Enterococcal pacemaker infection  ?First Dose: Yes ?Last Day of Therapy:  01/26/22 ?Labs - Once weekly:  CBC/D and BMP, ?Labs - Every other week:  ESR and CRP ?Method of administration: Ambulatory Pump (Continuous Infusion) ?Method of administration may be changed at the discretion of home infusion pharmacist based upon assessment of the patient and/or caregiver's ability to self-administer the medication ordered. 12/30/21 01/26/22  Danford, Suann Larry, MD  ?apixaban (ELIQUIS) 5 MG TABS tablet Take 1 tablet (5 mg total) by mouth 2 (two) times daily. 01/06/22   Baldwin Jamaica, PA-C  ?atorvastatin (LIPITOR) 40 MG tablet Take 40 mg by mouth daily.    [provider]  ?carisoprodol (SOMA) 350 MG  tablet Take 350 mg by mouth at bedtime as needed for muscle spasms.    [provider]  ?cefTRIAXone (ROCEPHIN) IVPB Inject 2 g into the vein every 12 (twelve) hours for 27 days. Indication:  Enterococcal pacemaker infection  ?First Dose: Yes ?Last Day of Therapy:  01/26/22 ?Labs - Once weekly:  CBC/D and BMP, ?Labs - Every other week:  ESR and CRP ?Method of administration: IV Push ?Method of administration may be changed at the discretion of home infusion pharmacist based upon assessment of the patient and/or caregiver's ability to self-administer the medication ordered. 12/30/21 01/26/22  DanfordSuann Larry, MD  ?doxazosin (CARDURA) 2 MG tablet Take 2 mg by mouth at bedtime.    [provider]  ?furosemide (LASIX) 40 MG tablet Take 40 mg by mouth daily.    [provider]  ?IFEREX 150 150 MG capsule Take 150 mg by mouth 2 (two) times daily. 10/07/21   [provider]  ?Insulin Aspart Prot & Aspart (NOVOLOG MIX 70/30 [place]) Inject 30 Units into the skin every morning.    [provider]  ?losartan (COZAAR) 100 MG tablet Take 50 mg by mouth daily.    [provider]  ?Multiple Vitamins-Minerals (CENTRUM SILVER ADULT 50+ PO) Take 1 tablet by mouth daily.    [provider]  ?nitroGLYCERIN (NITROSTAT) 0.4 MG SL tablet Place 0.4 mg under the tongue every 5 (five) minutes as needed for chest pain.    [provider]  ?pantoprazole (PROTONIX) 40 MG tablet Take 40 mg by mouth 2 (two) times daily. 11/30/21   [provider]  ?potassium chloride (K-DUR) 10 MEQ tablet Take 10 mEq by mouth daily.    [provider]  ?SYMBICORT 160-4.5 MCG/ACT inhaler Inhale 2 puffs into the lungs in the morning and at bedtime. 12/19/21   [provider]  ?traMADol (ULTRAM) 50 MG tablet Take 1 tablet (50 mg total) by mouth 2 (two) times daily as needed for severe pain. 01/01/22   Nita Sells, MD  ?triamcinolone cream (KENALOG) 0.1 % Apply 1  application. topically 2 (two) times daily as needed (itching). 12/14/21   [provider]  ?   ? ?Allergies    ?Cephalexin, Lorazepam, Ace inhibitors, Prednisone, and Sulfa antibiotics   ? ?Review of Systems   ?Review of Systems ? ?Physical Exam ?Updated Vital Signs ?BP (!) 156/73 (BP Location: Left Arm)   Pulse (!) 55   Temp 98.3 ?F (36.8 ?C) (Oral)   Resp 16   SpO2 97%  ?Physical Exam ?Vitals and nursing note reviewed.  ?Constitutional:   ?   Appearance: Normal appearance.  ?HENT:  ?   Head: Normocephalic and atraumatic.  ?Eyes:  ?   General: No scleral icterus. ?   Conjunctiva/sclera: Conjunctivae normal.  ?Cardiovascular:  ?   Rate and Rhythm: Regular rhythm. Bradycardia present.  ?Pulmonary:  ?   Effort: Pulmonary effort is normal. No respiratory distress.  ?Skin: ?   Findings: No rash.  ?   Comments: 2.5 to 3 inch hematoma under horizontal surgical site.  Slow ooze of dark red blood from the medial portion of the suture line  ?Neurological:  ?   Mental Status: He is alert.  ?Psychiatric:     ?   Mood and Affect: Mood normal.  ? ? ?ED Results / Procedures / Treatments   ?Labs ?(all labs ordered are listed, but only abnormal results are displayed) ?Labs Reviewed  ?CBC WITH DIFFERENTIAL/PLATELET - Abnormal; Notable for the following components:  ?    Result Value  ? RBC 3.49 (*)   ? Hemoglobin 9.4 (*)   ? HCT 29.8 (*)   ? All other components within normal limits  ?BASIC METABOLIC PANEL - Abnormal; Notable for the following components:  ? Glucose, Bld 161 (*)   ? Calcium 8.4 (*)   ? All other components within normal limits  ?PROTIME-INR  ?TYPE AND SCREEN  ? ? ?EKG ?None ? ?Radiology ?No results found. ? ?Procedures ?Procedures  ? ?Medications Ordered in ED ?Medications - No data to display ? ?ED Course/ Medical Decision Making/ A&P ?  ?                        ?Medical Decision Making ?Amount and/or Complexity of Data Reviewed ?Labs: ordered. ? ?Risk ?Decision regarding hospitalization. ? ? ?This  patient presents to the ED for concern of bleeding surrounding his surgical site from 4/23.  ?  ?Past Medical History / Co-morbidities / Social History: ?On Eliquis for tibial DVT diagnosed during hospitalization 4/22 ?  ?Additional history: ?Additional history obtained from chart review.  Patient and his wife called their cardiologist this morning who told them to come to the emergency department for further evaluation ?  ?Physical Exam: ?Physical exam performed. The pertinent findings include: Oozing from the medial aspect of the surgical site to the anterior left chest. ? ?Lab Tests: ?I ordered, and personally interpreted labs.  The  pertinent results include:  ?-Hemoglobin 9.4, down from 10.812 days ago.  Hematocrit 29.8, down from 33.4 12 days ago ? ?  ?Cardiac Monitoring:  ?The patient was maintained on a cardiac monitor.  My attending physician Messick viewed and interpreted the cardiac monitored which showed an underlying rhythm of: Sinus with a rate between 45 and 65 ?  ?Medications: ?Patient denied the need for pain medications at this time ? ?Consultations Obtained: ?I requested consultation with the cardiologist, Dr. Harl Bowie,  and she says she will come see the patient and to admit him for overnight observation for his bleeding.  Also recommended holding patient's Eliquis. Will admit to [facility] resident service. ? ?I discussed this case with my attending physician Dr. Francia Greaves who cosigned this note including patient's presenting symptoms, physical exam, and planned diagnostics and interventions. Attending physician stated agreement with plan or made changes to plan which were implemented.    ? ?Final Clinical Impression(s) / ED Diagnoses ?Final diagnoses:  ?Wound disruption, post-op, skin, initial encounter  ? ? ?Rx / DC Orders ?Admit to Dr. Jeani Hawking with family medicine residency ? ?  ?Rhae Hammock, PA-C ?01/12/22 1333 ? ?  ?Valarie Merino, MD ?01/14/22 1310 ? ?

## 2022-01-12 NOTE — Telephone Encounter (Signed)
71 yo male with SSS s/p pacer, Afib who discharged 01/01/22 after admission with sepsis and pacemaker system infection.  His device was extracted.  Post op he had a hematoma and his Eliquis was held.  His wound check on 4/27 was ok.  Eliquis was just started back 5 days ago.  He called the answering service today due to bleeding from the pacer extraction site.  He gave me verbal permission to speak to her Roselind Messier).  He has some pain around the site but it is no worse.  He has not had any fever, weakness. The site still has steri strips applied.  She notes that the steri strips are now soaked with blood and his shirt was soaked this AM.  The bleeding appears to be oozing. ? ?PLAN:  ?Given recent events with pacer system extraction, I have advised her to take him to the closest ED for evaluation.  She agrees with this plan. ? ?[name], PA-C    ?01/12/2022 11:27 AM   ?

## 2022-01-12 NOTE — ED Triage Notes (Signed)
Pt had pacemaker removed on 4/23.  Taking blood thinner.  Reports bleeding at surgical site since this morning.  Dried blood noted on shirt and around dressing. ?

## 2022-01-12 NOTE — H&P (Signed)
Family Medicine Teaching Service ?Hospital Admission History and Physical ?Service Pager: [phone] ? ?Patient name: Jason Jarvis Medical record number: [id number] ?Date of birth: 08-19-1951 Age: 71 y.o. Gender: male ? ?Primary Care Provider: Jeni Salles, MD ?Consultants: Cardiology ?Code Status: DNR ?Preferred Emergency Contact:  Primary Emergency Contact: [place], Home Phone: [phone] home phone, [phone] cell phone. ? ?Chief Complaint: Post-op bleeding ? ?Assessment and Plan: ?Jason Jarvis is a 71 y.o. male with PMH SSS s/p PPM x3 due to infxn, CAD s/p cath, DM2, stroke, renal cancer, BLE DVT, PAF no AC, HLD, HTN, CKD**, COPD, BPH, MDD/GAD who presented to [facility] (01/12/2022) for postop bleed of PPM removal (12/29/2021) after vegetation on TEE (12/26/2021). ? ?Patient was recently started on Eliquis 01/07/2022 after surgery for A-fib and bilateral lower extremity DVT found incidentally. ? ?Postoperative bleeding s/p pacemaker removal 4/23 while on Eliquis for 5 days ?Sudden onset of acute bleed and hematoma at surgical site exacerbated by Eliquis.  Unclear reason for bleed, given patient was started on Eliquis 9 days after the surgery and was on Eliquis for 5 days before bleeding started.  Neurology has already seen the patient has been consulted, and requested the patient be admitted overnight for observation.  In the ED the wound was slowly oozing despite Dermabond, required dressing change and pressure to stop bleeding. 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 Afebrile, no leukocytosis, denied chills or any other signs of acute infection.  ?Baseline hemoglobin appears to be 11-12, presented to ED at Hb 9.4. Bradycardic HR 40s, mildly hypertensive BP 160s.  ?Last Eliquis this morning at 1000 (started on 01/12/2022 day #5). ?Cardiology Dr. Azucena Kuba to evaluate, appreciate  assistance recommendations ?Hold home Eliquis ?Continue IV ampicillin 12 g daily through 5/21 ?Continue ceftriaxone 2 g every 12 through 5/21 ?Patient admitted to med telemetry observation with attending Jason Jarvis, Jason Ohara, MD  ?Cardiology following, Vitals per unit routine ?Cardiac monitoring-history of pAfib ?PT/OT eval and treat ?Diet: Regular  ?Follow-up: CBC, BMP ? ?SSS s/p failed PPM x3 from infection 2/2 HFrEF (50%)  PAF  MV prolapse  HTN ?Most recent echo 12/23/2021 demonstrates EF 50%, global LV hypokinesis with septal lateral dyssynchrony from RV pacing.  ?Home: Furosemide 40 mg daily, losartan 50 mg daily, nitroglycerin SL tablet., K-Dur 30 mEq daily, amlodipine 2.5 mg daily  ?Restarted home Lasix, losartan and amlodipine  ? ?LLE DVT in the left posterior tib ?History of renal cancer per chart ?Was found during last hospitalization.  Started on Eliquis for it. ? ?T2DM, well-controlled ?A1c 6.5 (12/22/2021) ?Home: NovoLog 70/30 take 30 units a.m., 20 units nightly.  ?As NovoLog 70/30 is not on our hospital formulary, will start Lantus 15 units daily (~30% of 50) and sensitive sliding scale insulin.  Will titrate as necessary. ? ?CAD  HLD  CVA ?Residual minimal left sided weakness due to past stroke.  Patient has difficulty swallowing pills since childhood, not related. ?Home: Atorvastatin 40 mg daily ?Restarted home atorvastatin ? ?COPD ?Home: Symbicort ?Restarted Dulera-formulary equivalent ? ?Iron deficiency ?Home: Iferex (Iron polysaccharide) 150 mg nightly ?Restarted home iron supplement ? ?BPH ?Home: Doxazosin 2 mg qHS -restarted ? ?Severe pain  muscle spasm ?Home: Tramadol 50 mg twice daily as needed, carisoprodol (Soma) 350 mg nightly as needed ? ?GERD ?Protonix ? ?FEN/GI: Regular ?Prophylaxis:   Held due to bleeds ? ?Disposition:  ?Dispo: Pending PT/OT evaluation,      ?  DME: N/A   ( ) ?Barrier: Clinical improvement ? ? ?History of Present Illness:   ?Jason Jarvis is a 71 y.o. male presenting  with hematoma and associated postop bleed while on Eliquis.  Last dose of Eliquis was on day of admission 5/7 in the morning around 8-9 AM.  About 10 AM, patient went to the restroom afterwards noted bleeding on his shirt with associated dizziness, lightheadedness, unsteady gait.  Wife is witnessed, no falls or trauma.  Wife really called patient's doctor who suggested they present to the ED.  Prior to the acute dizziness and initial bleed around 10 AM, patient felt at baseline.  Denied weakness, SOB, confusion, nausea, vomiting, chest pain. ? ?Per wife, patient was started on Eliquis only a few days prior for bilateral lower extremity DVTs that she believes was there prior to his last hospitalization.  He is currently on no other blood thinners, but was on baby aspirin prior to his recent hospitalization. ? ?Patient reported medication adherence, denied EtOH or smoking. ? ?At baseline patient has mild residual left-sided weakness from past stroke, has dysarthria, and difficulty swallowing pills, requiring him to be crutch since childhood, not related. ? ?Review Of Systems:  ?Per HPI with the following additions: ?Review of Systems  ?Constitutional:  Positive for chills. Negative for diaphoresis and fatigue.  ?Respiratory:  Negative for shortness of breath.   ?Cardiovascular:  Negative for chest pain and palpitations.  ?Gastrointestinal:  Negative for abdominal pain, constipation, diarrhea, nausea and vomiting.  ?Genitourinary:  Negative for dysuria, flank pain, frequency and hematuria.  ?Musculoskeletal:  Positive for gait problem (Difficulty with balance).  ?Neurological:  Positive for weakness.  ?Hematological:  Bruises/bleeds easily.  ?Psychiatric/Behavioral:  Negative for confusion.    ? ?Patient Active Problem List  ? Diagnosis Date Noted  ? Pacemaker pocket hematoma 01/12/2022  ? Impaired functional mobility, balance, gait, and endurance 01/12/2022  ? Bleeding from wound 01/12/2022  ? Wound disruption,  post-op, skin, initial encounter   ? Chronic systolic CHF (congestive heart failure) ([place]) 12/29/2021  ? Bacteremia due to Enterococcus   ? Coronary artery disease 12/22/2021  ? Deep vein thrombosis (DVT) of distal vein of left lower extremity of indeterminate age [facility]) 12/22/2021  ? Chronic obstructive pulmonary disease ([place]) 10/14/2021  ? Hammertoes of both feet 03/20/2021  ? Paroxysmal atrial fibrillation ([place]) 08/25/2019  ? GERD (gastroesophageal reflux disease) 04/11/2018  ? 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 Plantar callosity 12/18/2016  ? Primary osteoarthritis of right knee 12/13/2016  ? Pre-ulcerative corn or callous 03/03/2016  ? Diabetic polyneuropathy associated with type 2 diabetes mellitus ([place]) 03/03/2016  ? Onychomycosis 08/21/2015  ? SSS (sick sinus syndrome) ([place]) 05/14/2015  ? Right brachial plexitis 05/12/2015  ? Hyperlipidemia associated with type 2 diabetes mellitus ([place]) 05/11/2013  ? Essential hypertension 05/11/2013  ? ? ?Past Medical History: ?Past Medical History:  ?Diagnosis Date  ? Acute metabolic encephalopathy 04/02/3663  ? Arm DVT (deep venous thromboembolism), acute ([place])   ? Bleeding from wound 01/12/2022  ? Brachial plexopathy 05/14/2015  ? Cellulitis of middle toe 02/25/2016  ? Cervical radiculopathy at C8 05/10/2015  ? Chronic kidney disease   ? Cord compression ([place]) 05/10/2015  ? Coronary artery disease   ? Deep vein thrombosis (DVT) of distal vein of left lower extremity of indeterminate age [facility]) 12/29/2021  ? Diabetic ulcer of toe of left foot associated with diabetes mellitus due to underlying condition, with fat layer exposed ([place]) 07/04/2021  ? DVT of axillary vein,  acute right ([place]) 10/25/2017  ? Empyema of gallbladder 10/24/2017  ? GIB (gastrointestinal bleeding) 10/20/2017  ? History of kidney cancer 02/10/2019  ? Hypercholesterolemia 04/22/2016  ? Hypertension   ? Iron deficiency anemia due to chronic blood loss 07/29/2017  ? Formatting of this note might be different from the  original. Added automatically from request for surgery [phone]  ? Metatarsalgia of left foot 08/21/2015  ? Metatarsalgia of right foot 07/03/2015  ? MRSA bacteremia 06/18/2017  ? MVP (mitral valve prolapse) 06/10/2013

## 2022-01-12 NOTE — ED Notes (Signed)
Quick clot bandage and occlusive dressing placed over bleeding site on left chest.  ?

## 2022-01-13 ENCOUNTER — Observation Stay (HOSPITAL_BASED_OUTPATIENT_CLINIC_OR_DEPARTMENT_OTHER): Payer: Medicare Other

## 2022-01-13 DIAGNOSIS — I10 Essential (primary) hypertension: Secondary | ICD-10-CM | POA: Diagnosis not present

## 2022-01-13 DIAGNOSIS — I8000 Phlebitis and thrombophlebitis of superficial vessels of unspecified lower extremity: Secondary | ICD-10-CM | POA: Diagnosis not present

## 2022-01-13 DIAGNOSIS — T8131XA Disruption of external operation (surgical) wound, not elsewhere classified, initial encounter: Secondary | ICD-10-CM | POA: Diagnosis not present

## 2022-01-13 LAB — CBC
HCT: 31 % — ABNORMAL LOW (ref 39.0–52.0)
Hemoglobin: 9.9 g/dL — ABNORMAL LOW (ref 13.0–17.0)
MCH: 26.7 pg (ref 26.0–34.0)
MCHC: 31.9 g/dL (ref 30.0–36.0)
MCV: 83.6 fL (ref 80.0–100.0)
Platelets: 228 10*3/uL (ref 150–400)
RBC: 3.71 MIL/uL — ABNORMAL LOW (ref 4.22–5.81)
RDW: 14.9 % (ref 11.5–15.5)
WBC: 4.8 10*3/uL (ref 4.0–10.5)
nRBC: 0 % (ref 0.0–0.2)

## 2022-01-13 LAB — GLUCOSE, CAPILLARY
Glucose-Capillary: 107 mg/dL — ABNORMAL HIGH (ref 70–99)
Glucose-Capillary: 167 mg/dL — ABNORMAL HIGH (ref 70–99)
Glucose-Capillary: 153 mg/dL — ABNORMAL HIGH (ref 70–99)
Glucose-Capillary: 152 mg/dL — ABNORMAL HIGH (ref 70–99)

## 2022-01-13 LAB — BASIC METABOLIC PANEL
Anion gap: 7 (ref 5–15)
BUN: 8 mg/dL (ref 8–23)
CO2: 24 mmol/L (ref 22–32)
Calcium: 7.9 mg/dL — ABNORMAL LOW (ref 8.9–10.3)
Chloride: 110 mmol/L (ref 98–111)
Creatinine, Ser: 0.86 mg/dL (ref 0.61–1.24)
GFR, Estimated: >60 mL/min (ref >60)
Glucose, Bld: 97 mg/dL (ref 70–99)
Potassium: 3.4 mmol/L — ABNORMAL LOW (ref 3.5–5.1)
Sodium: 141 mmol/L (ref 135–145)

## 2022-01-13 MED ORDER — CHLORHEXIDINE GLUCONATE CLOTH 2 % EX PADS
6.0000 | MEDICATED_PAD | Freq: Every day | CUTANEOUS | Status: DC
  Administered 2022-01-13 – 2022-01-15 (×3): 6 via TOPICAL

## 2022-01-13 NOTE — Care Management Obs Status (Signed)
MEDICARE OBSERVATION STATUS NOTIFICATION ? ? ?Patient Details  ?Name: Jason Jarvis ?MRN: [id number] ?Date of Birth: [birth year] ? ? ?Medicare Observation Status Notification Given:  Yes ? ? ? ?Bethena Roys, RN ?01/13/2022, 1:24 PM ?

## 2022-01-13 NOTE — Consult Note (Signed)
?Cardiology Consultation:  ? ?Patient ID: Jason Jarvis ?MRN: [id number]; DOB: 11/28/1950 ? ?Admit date: 01/12/2022 ?Date of Consult: 01/13/2022 ? ?PCP:  Jeni Salles, MD ?  ?[facility] Providers ?Cardiologist:   Dr. Gerarda Gunther ([place], [facility]) ? ? ? ?Patient Profile:  ? ?Jason Jarvis is a 71 y.o. male with a hx of CKD w/hx of RCC w.hx of cryoablation, HTN, stroke (remote), DM, COPD,  seizure d/o (off AED)w/PPM (initial implant 2018 with hx of prior bacteremia/endocarditis and extraction >> new PPM 2019)  ? ?He admitted recently with AMS, sepsis found with bacteremia > endocarditis >> PPM extraction 01/01/22 who is being seen 01/13/2022 for the evaluation of pocket hematoma/bleeding at the request of Dr. McDiarmid. ? ?History of Present Illness:  ? ?Jason Jarvis as discussed above had a RECURRENT bacteremia 12/22/21 and PPM lead endocarditis, eventually undergoing PPM system extraction by Dr. Lovena Le 12/29/21. ?P/o he was on lovenox for an age undetermined DVT and developed a hematoma. ?He was also carried a hx of AFib, confirmed by his device prior to extraction with low burden does carry stroke hx. ?He was sent home 01/01/22 with a pressure dressing in place with plans to start Eliquis POD #5 if site was stable ? ?No plans for re-implant of PPM ? ?01/03/22 pressure dressing removed by office RN, looked stable by chart image.  In d/w wife 5/1 reported as stable and started on Eliquis (POD # 9) ? ?He was admitted yesterday with development of bleeding/swelling at the site, found to have a hematoma, reported as sudden onset that day (5 days on Eliquis) ? ?Cardiology consulted, reported ER used quick-clot and holding pressure  ?BP/VSS with his baseline SB. ? ?LABS ?K+ 3.4 ?BUN/Creat 8/0.86 ?WBC 4.8 ?Hbg 9.9 (prior 10.8) ?Hct 31 ?Plts 228 ? ?Patient feels "fine", mod discomfort from pressure yesterday ?His wife said the site looked great until yesterday AM, just started bleeding. ?The pt denies any unusual activities  or trauma ?No CP, palpitations ?No weakness, dizzy spells, near syncope or syncope. ? ? ?Past Medical History:  ?Diagnosis Date  ? Acute metabolic encephalopathy 1/51/7616  ? Arm DVT (deep venous thromboembolism), acute ([place])   ? Bleeding from wound 01/12/2022  ? Brachial plexopathy 05/14/2015  ? Cellulitis of middle toe 02/25/2016  ? Cervical radiculopathy at C8 05/10/2015  ? Chronic kidney disease   ? Cord compression ([place]) 05/10/2015  ? Coronary artery disease   ? Deep vein thrombosis (DVT) of distal vein of left lower extremity of indeterminate age [facility]) 12/29/2021  ? Diabetic ulcer of toe of left foot associated with diabetes mellitus due to underlying condition, with fat layer exposed ([place]) 07/04/2021  ? DVT of axillary vein, acute right ([place]) 10/25/2017  ? Empyema of gallbladder 10/24/2017  ? GIB (gastrointestinal bleeding) 10/20/2017  ? History of kidney cancer 02/10/2019  ? Hypercholesterolemia 04/22/2016  ? Hypertension   ? Iron deficiency anemia due to chronic blood loss 07/29/2017  ? Formatting of this note might be different from the original. Added automatically from request for surgery [id number]  ? Metatarsalgia of left foot 08/21/2015  ? Metatarsalgia of right foot 07/03/2015  ? MRSA bacteremia 06/18/2017  ? MVP (mitral valve prolapse) 06/10/2013  ? Myocardial infarction [facility])   ? pt states that he had a "light heart attack about 25 yrs ago"  ? Pacemaker   ? Pacemaker pocket hematoma 01/12/2022  ? Pneumonia   ? Renal cell carcinoma of right kidney ([place]) 06/09/2019  ? Right renal mass 03/31/2016  ?  S/P placement of cardiac pacemaker   ? Scrotal pain 12/22/2021  ? Severe sepsis ([place]) 12/22/2021  ? Snoring 08/25/2019  ? Stroke [facility])   ? approx 15 yrs ago  ? ? ?Past Surgical History:  ?Procedure Laterality Date  ? BUBBLE STUDY  12/26/2021  ? Procedure: BUBBLE STUDY;  Surgeon: [name], MD;  Location: [place];  Service: Cardiovascular;;  ? IR GENERIC HISTORICAL  04/22/2016  ? IR RADIOLOGIST EVAL & MGMT 04/22/2016  Greggory Keen, MD [facility] INTERV RAD  ? IR RADIOLOGIST EVAL & MGMT  05/18/2019  ? IR RADIOLOGIST EVAL & MGMT  07/06/2019  ? IR RADIOLOGIST EVAL & MGMT  10/26/2019  ? IR RADIOLOGIST EVAL & MGMT  10/30/2020  ? IR RADIOLOGIST EVAL & MGMT  11/01/2021  ? PACEMAKER IMPLANT  05/2017  ? PACEMAKER LEAD REMOVAL N/A 12/29/2021  ? Procedure: PACEMAKER LEAD REMOVAL;  Surgeon: Evans Lance, MD;  Location: [place];  Service: Cardiovascular;  Laterality: N/A;  ? PACEMAKER REMOVAL  07/2017  ? TEE WITHOUT CARDIOVERSION N/A 12/26/2021  ? Procedure: TRANSESOPHAGEAL ECHOCARDIOGRAM (TEE);  Surgeon: [name], MD;  Location: [place];  Service: Cardiovascular;  Laterality: N/A;  ?  ? ?Home Medications:  ?Prior to Admission medications   ?Medication Sig Start Date End Date Taking? Authorizing Provider  ?acetaminophen (TYLENOL) 500 MG tablet Take 2 tablets (1,000 mg total) by mouth every 6 (six) hours as needed. ?Patient taking differently: Take 500 mg by mouth every 6 (six) hours as needed. 01/01/22 01/01/23 Yes Nita Sells, MD  ?amLODipine (NORVASC) 2.5 MG tablet  01/23/16  Yes [provider]  ?ampicillin IVPB Inject 12 g into the vein daily for 27 days. As a continuous infusion. Indication:  Enterococcal pacemaker infection  ?First Dose: Yes ?Last Day of Therapy:  01/26/22 ?Labs - Once weekly:  CBC/D and BMP, ?Labs - Every other week:  ESR and CRP ?Method of administration: Ambulatory Pump (Continuous Infusion) ?Method of administration may be changed at the discretion of home infusion pharmacist based upon assessment of the patient and/or caregiver's ability to self-administer the medication ordered. 12/30/21 01/26/22 Yes Danford, Suann Larry, MD  ?apixaban (ELIQUIS) 5 MG TABS tablet Take 1 tablet (5 mg total) by mouth 2 (two) times daily. 01/06/22  Yes Baldwin [place], PA-C  ?atorvastatin (LIPITOR) 40 MG tablet Take 40 mg by mouth daily.   Yes [provider]  ?carisoprodol (SOMA) 350 MG tablet Take 350 mg  by mouth at bedtime as needed for muscle spasms.   Yes [provider]  ?cefTRIAXone (ROCEPHIN) IVPB Inject 2 g into the vein every 12 (twelve) hours for 27 days. Indication:  Enterococcal pacemaker infection  ?First Dose: Yes ?Last Day of Therapy:  01/26/22 ?Labs - Once weekly:  CBC/D and BMP, ?Labs - Every other week:  ESR and CRP ?Method of administration: IV Push ?Method of administration may be changed at the discretion of home infusion pharmacist based upon assessment of the patient and/or caregiver's ability to self-administer the medication ordered. 12/30/21 01/26/22 Yes Danford, Suann Larry, MD  ?doxazosin (CARDURA) 2 MG tablet Take 2 mg by mouth at bedtime.   Yes [provider]  ?furosemide (LASIX) 40 MG tablet Take 40 mg by mouth daily.   Yes [provider]  ?IFEREX 150 150 MG capsule Take 150 mg by mouth at bedtime. 10/07/21  Yes [provider]  ?Insulin Aspart Prot & Aspart (NOVOLOG MIX 70/30 [place]) Inject 20-30 Units into the skin every morning. [place]  units in the morning, 20 units at bedtime   Yes [provider]  ?losartan (COZAAR) 100 MG tablet Take 50 mg by mouth daily.   Yes [provider]  ?Multiple Vitamins-Minerals (CENTRUM SILVER ADULT 50+ PO) Take 1 tablet by mouth daily.   Yes [provider]  ?nitroGLYCERIN (NITROSTAT) 0.4 MG SL tablet Place 0.4 mg under the tongue every 5 (five) minutes as needed for chest pain.   Yes [provider]  ?pantoprazole (PROTONIX) 40 MG tablet Take 40 mg by mouth 2 (two) times daily. 11/30/21  Yes [provider]  ?potassium chloride (K-DUR) 10 MEQ tablet Take 30 mEq by mouth daily.   Yes [provider]  ?SYMBICORT 160-4.5 MCG/ACT inhaler Inhale 2 puffs into the lungs in the morning and at bedtime. 12/19/21  Yes [provider]  ?traMADol (ULTRAM) 50 MG tablet Take 1 tablet (50 mg total) by mouth 2 (two) times daily as needed for severe pain. 01/01/22  Yes Nita Sells, MD  ?triamcinolone cream (KENALOG) 0.1 % Apply 1 application. topically 2 (two) times daily as needed (itching). 12/14/21  Yes [provider]  ? ? ?Inpatient Medications: ?Scheduled Meds:

## 2022-01-13 NOTE — Progress Notes (Signed)
Family Medicine Teaching Service ?Daily Progress Note ?Intern Pager: [phone] ? ?Patient name: Michelle Vanhise Medical record number: [id number] ?Date of birth: Sep 24, 1950 Age: 71 y.o. Gender: male ? ?Primary Care Provider: Jeni Salles, MD ?Consultants: Cardiology ?Code Status: DNR ? ?Pt Overview and Major Events to Date:  ?5/7: - Pt admitted ? ?Assessment and Plan: ? ?Derel Mcglasson is a 71 y.o. male with PMH SSS s/p PPM x3 due to infxn, CAD s/p cath, DM2, stroke, renal cancer, BLE DVT, PAF no AC, HLD, HTN, CKD**, COPD, BPH, MDD/GAD who presented to [facility] (01/12/2022) for postop bleed of PPM removal (12/29/2021) after vegetation on TEE (12/26/2021). ?  ?Patient was recently started on Eliquis 01/07/2022 after surgery for A-fib and bilateral lower extremity DVT found incidentally. ?  ?Postoperative bleeding s/p pacemaker removal 4/23 while on Eliquis for 5 days ?Patient hgb stable today at 9.9 (9.4), sgx site was clean with dried blood, no active signs of bleeding. Patient denied any symptoms of dyspnea, fatigue, light-headedness, bleeding in stool/urine. Patient POD # 15 at this time. ?-F/u CBC, BMP ?-Cardiology following, appreciate recs ?-Hold eliquis ?-Vitals per unit ?-Contin Cardiac monitor ?-PT/OT ?-AM CBC, BMP ? ?Sick Sinus Syndrome s/p failed pacemaker x3 2/2 infection  HFrEF (50%)  PAF  MV prolapse  HTN ?Echo 4/23 - EF 50%, global LV hypokinesis w/ septal lateral dyssynchrony from RV pacing. BP range overnight 140-160's/60-70's, UOP 1300, HR appears bradycardic on exam. ?-Continue IV ampicillin 12g daily through 5/21 ?-Continue CFTX 2 g BID through 5/21 ?-Lasix 40 mg daily, losartan 50 mg daily, amlodipine 2.5 mg daily ?-Continuous cardiac monitoring ? ? ?LLE DVT in the left posterior tib  ?Hx renal cancer per chart ?Last hospitalization in 4/26 found DVT, was on eliquis for PAF, now d/c 2/2 bleeding sgx site. DVT U/S today demonstrates no evidence of thrombosis in LE. ?-Continue to monitor ? ?T2DM,  well-controlled ?-sSSI, lantus 15 u daily ? ?CAD  HLD  CVA ?-Atorvastatin 40 mg daily ? ?COPD ?-Dulera  ? ?Iron deficiency ?-Iron sup ? ?BPH ?-Doxazosin 2 mg qhs ? ?Severe pain  muscle spasm ? -Tramadol 50 mg BID prn, carisoprodol 350 mg qhs prn ? ?GERD ?-Protonix  ? ?FEN/GI: Regular ?PPx: SCD's  ?Dispo:Home in 2-3 days.  ? ?Subjective:  ?Patient with no complaints today, says he is doing well and feels fine.  ? ?Objective: ?Temp:  [98 ?F (36.7 ?C)-98.7 ?F (37.1 ?C)] 98.4 ?F (36.9 ?C) (05/08 0408) ?Pulse Rate:  [47-60] 60 (05/08 0408) ?Resp:  [16-19] 17 (05/07 1946) ?BP: (138-158)/(62-78) 157/65 (05/08 0408) ?SpO2:  [96 %-100 %] 96 % (05/08 0408) ?Physical Exam: ?General: Well appearing, polite, NAD, White Male  ?Cardiovascular: RRR, NRMG ?Respiratory: CTABL, no wheezes or stridor ?Abdomen: Soft, NTTP, non-distended ?Extremities: No edema, pulses intact, cap refill < 2 sec ? ?Laboratory: ?Recent Labs  ?Lab 01/12/22 ?1239  ?WBC 4.2  ?HGB 9.4*  ?HCT 29.8*  ?PLT 233  ? ?Recent Labs  ?Lab 01/12/22 ?1239  ?NA 139  ?K 3.7  ?CL 104  ?CO2 26  ?BUN 9  ?CREATININE 0.99  ?CALCIUM 8.4*  ?GLUCOSE 161*  ? ? ? ? ?Imaging/Diagnostic Tests: ? ? ?Holley Bouche, MD ?01/13/2022, 5:29 AM ?PGY-1, [place] [facility] ?[place] Intern pager: [phone], text pages welcome ? ?

## 2022-01-13 NOTE — TOC Initial Note (Signed)
Transition of Care (TOC) - Initial/Assessment Note  ? ? ?Patient Details  ?Name: Lonza Steinhoff ?MRN: [id number] ?Date of Birth: 1951-04-15 ? ?Transition of Care (TOC) CM/SW Contact:    ?Graves-Bigelow, Ocie Cornfield, RN ?Phone Number: ?01/13/2022, 1:29 PM ? ?Clinical Narrative:  Patient is currently from home with spouse. Patient is active with [facility] for Nursing Services. [employer] provides the IV antibiotic infusions. Both companies aware that the patient is hospitalized. Patient will need resumption orders and F2F once stable to transition home. Case Manager will continue to follow for additional transition of care needs.               ? ? ?Expected Discharge Plan: [place] ?Barriers to Discharge: No Barriers Identified ? ? ?Patient Goals and CMS Choice ?Patient states their goals for this hospitalization and ongoing recovery are:: to return home with IV antibiotics ?  ?Choice offered to / list presented to : Patient, Spouse ? ?Expected Discharge Plan and Services ?Expected Discharge Plan: [place] ?In-house Referral: NA ?Discharge Planning Services: CM Consult ?Post Acute Care Choice: Durable Medical Equipment, Home Health, Resumption of Svcs/PTA Provider ?Living arrangements for the past 2 months: [place] ?                ?DME Arranged: IV pump/equipment ?DME Agency: Other - Comment ([employer]) ?Date DME Agency Contacted: 01/13/22 ?Time DME Agency Contacted: [phone] ?Representative spoke with at DME Agency: Pam ?[place] Arranged: RN, Disease Management, IV Antibiotics ?[place] Agency: [place] ?Date HH Agency Contacted: 01/13/22 ?Time [place]: [id number] ?Representative spoke with at [place]: Tommi Rumps ? ?Prior Living Arrangements/Services ?Living arrangements for the past 2 months: [place] ?Lives with:: Spouse ?Patient language and need for interpreter reviewed:: Yes ?Do you feel safe going back to the place where you live?: Yes      ?Need for  Family Participation in Patient Care: Yes (Comment) ?Care giver support system in place?: Yes (comment) ?  ?  ? ?Permission Sought/Granted ?Permission sought to share information with : Family Supports, Customer service manager, Case Manager ?Permission granted to share information with : Yes, Verbal Permission Granted ?   ? Permission granted to share info w AGENCY: [facility] ?   ?   ? ?Emotional Assessment ?Appearance:: Appears stated age ?Attitude/Demeanor/Rapport: Engaged ?Affect (typically observed): Appropriate ?Orientation: : Oriented to Self, Oriented to Place, Oriented to  Time, Oriented to Situation ?Alcohol / Substance Use: Not Applicable ?Psych Involvement: No (comment) ? ?Admission diagnosis:  Bradycardia [R00.1] ?Wound disruption, post-op, skin, initial encounter [T81.31XA] ?Patient Active Problem List  ? Diagnosis Date Noted  ? Pacemaker pocket hematoma 01/12/2022  ? Impaired functional mobility, balance, gait, and endurance 01/12/2022  ? Bleeding from wound 01/12/2022  ? Wound disruption, post-op, skin, initial encounter   ? Chronic systolic CHF (congestive heart failure) ([place]) 12/29/2021  ? Bacteremia due to Enterococcus   ? Coronary artery disease 12/22/2021  ? Deep vein thrombosis (DVT) of distal vein of left lower extremity of indeterminate age [facility]) 12/22/2021  ? Chronic obstructive pulmonary disease ([place]) 10/14/2021  ? Hammertoes of both feet 03/20/2021  ? Paroxysmal atrial fibrillation ([place]) 08/25/2019  ? GERD (gastroesophageal reflux disease) 04/11/2018  ? 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 Plantar callosity 12/18/2016  ? Primary osteoarthritis of right knee 12/13/2016  ? Pre-ulcerative corn or callous 03/03/2016  ? Diabetic polyneuropathy associated with type 2 diabetes mellitus ([place]) 03/03/2016  ? Onychomycosis 08/21/2015  ?  SSS (sick sinus syndrome) ([place]) 05/14/2015  ? Right brachial plexitis 05/12/2015  ? Hyperlipidemia associated with type 2 diabetes mellitus ([place])  05/11/2013  ? Essential hypertension 05/11/2013  ? ?PCP:  Jeni Salles, MD ?Pharmacy:   ?[facility], [place] ?[address] ?[place] [address] ?Phone: [phone] Fax: [phone] ? ?Readmission Risk Interventions ?   ? View : No data to display.  ?  ?  ?  ? ? ? ?

## 2022-01-13 NOTE — Progress Notes (Signed)
FPTS Interim Progress Note ? ?S:Patient sitting upright in chair, denies any acute issues ? ?O: ?BP (!) 148/68 (BP Location: Right Arm)   Pulse (!) 55   Temp 98 ?F (36.7 ?C) (Oral)   Resp 17   SpO2 98%   ? ?GEN: Laying in bed comfortably ?Resp: No increased work of breathing ? ?A/P: ?Labs ordered, no changes to plan ? ?Gerrit Heck, MD ?01/13/2022, 3:52 AM ?PGY-1, [place] ?Service pager [phone]  ?

## 2022-01-13 NOTE — Consult Note (Signed)
?Hospital Consult ? ? ? ?Reason for Consult: IVC filter placement ?Referring Physician: Dr. Posey Pronto ?MRN #:  [id number] ? ?History of Present Illness: This is a 71 y.o. male with a history of a right upper extremity DVT in 2019 at the time of PICC line placement.  He recently had a DVT study at [facility] long which demonstrated an indeterminate thrombus of the left lower extremity in the posterior tibial vein.  He tells me that he does not have any swelling at this time and was unsure why the ultrasound was performed.  He states that he does occasionally have bilateral ankle swelling when he does not take his diuretic but otherwise no swelling in his legs.  Recently had a pacemaker removed complicated by hematoma.  He does have paroxysmal atrial fibrillation with plans for anticoagulation in the future.  He has no complaints at this time. ? ?Past Medical History:  ?Diagnosis Date  ? Acute metabolic encephalopathy 2/29/7989  ? Arm DVT (deep venous thromboembolism), acute ([place])   ? Bleeding from wound 01/12/2022  ? Brachial plexopathy 05/14/2015  ? Cellulitis of middle toe 02/25/2016  ? Cervical radiculopathy at C8 05/10/2015  ? Chronic kidney disease   ? Cord compression ([place]) 05/10/2015  ? Coronary artery disease   ? Deep vein thrombosis (DVT) of distal vein of left lower extremity of indeterminate age [facility]) 12/29/2021  ? Diabetic ulcer of toe of left foot associated with diabetes mellitus due to underlying condition, with fat layer exposed ([place]) 07/04/2021  ? DVT of axillary vein, acute right ([place]) 10/25/2017  ? Empyema of gallbladder 10/24/2017  ? GIB (gastrointestinal bleeding) 10/20/2017  ? History of kidney cancer 02/10/2019  ? Hypercholesterolemia 04/22/2016  ? Hypertension   ? Iron deficiency anemia due to chronic blood loss 07/29/2017  ? Formatting of this note might be different from the original. Added automatically from request for surgery [id number] [id number]  ? Metatarsalgia of left foot 08/21/2015  ? Metatarsalgia of right foot  07/03/2015  ? MRSA bacteremia 06/18/2017  ? MVP (mitral valve prolapse) 06/10/2013  ? Myocardial infarction [facility])   ? pt states that he had a "light heart attack about 25 yrs ago"  ? Pacemaker   ? Pacemaker pocket hematoma 01/12/2022  ? Pneumonia   ? Renal cell carcinoma of right kidney ([place]) 06/09/2019  ? Right renal mass 03/31/2016  ? S/P placement of cardiac pacemaker   ? Scrotal pain 12/22/2021  ? Severe sepsis ([place]) 12/22/2021  ? Snoring 08/25/2019  ? Stroke [facility])   ? approx 15 yrs ago  ? ? ?Past Surgical History:  ?Procedure Laterality Date  ? BUBBLE STUDY  12/26/2021  ? Procedure: BUBBLE STUDY;  Surgeon: [name], MD;  Location: [place];  Service: Cardiovascular;;  ? IR GENERIC HISTORICAL  04/22/2016  ? IR RADIOLOGIST EVAL & MGMT 04/22/2016 Greggory Keen, MD [facility] INTERV RAD  ? IR RADIOLOGIST EVAL & MGMT  05/18/2019  ? IR RADIOLOGIST EVAL & MGMT  07/06/2019  ? IR RADIOLOGIST EVAL & MGMT  10/26/2019  ? IR RADIOLOGIST EVAL & MGMT  10/30/2020  ? IR RADIOLOGIST EVAL & MGMT  11/01/2021  ? PACEMAKER IMPLANT  05/2017  ? PACEMAKER LEAD REMOVAL N/A 12/29/2021  ? Procedure: PACEMAKER LEAD REMOVAL;  Surgeon: Evans Lance, MD;  Location: [place];  Service: Cardiovascular;  Laterality: N/A;  ? PACEMAKER REMOVAL  07/2017  ? TEE WITHOUT CARDIOVERSION N/A 12/26/2021  ? Procedure: TRANSESOPHAGEAL ECHOCARDIOGRAM (TEE);  Surgeon: [name], MD;  Location: [facility]  ENDOSCOPY;  Service: Cardiovascular;  Laterality: N/A;  ? ? ?Allergies  ?Allergen Reactions  ? Ativan [Lorazepam] Other (See Comments)  ?  Hallucinated / Confused / Combative  ? Keflex [Cephalexin] Hives  ? Ace Inhibitors Cough  ?   ?  ? Prednisone Rash  ? Sulfa Antibiotics Rash  ? ? ?Prior to Admission medications   ?Medication Sig Start Date End Date Taking? Authorizing Provider  ?acetaminophen (TYLENOL) 500 MG tablet Take 2 tablets (1,000 mg total) by mouth every 6 (six) hours as needed. ?Patient taking differently: Take 500 mg by mouth every 6 (six) hours as  needed. 01/01/22 01/01/23 Yes Nita Sells, MD  ?amLODipine (NORVASC) 2.5 MG tablet  01/23/16  Yes [provider]  ?ampicillin IVPB Inject 12 g into the vein daily for 27 days. As a continuous infusion. Indication:  Enterococcal pacemaker infection  ?First Dose: Yes ?Last Day of Therapy:  01/26/22 ?Labs - Once weekly:  CBC/D and BMP, ?Labs - Every other week:  ESR and CRP ?Method of administration: Ambulatory Pump (Continuous Infusion) ?Method of administration may be changed at the discretion of home infusion pharmacist based upon assessment of the patient and/or caregiver's ability to self-administer the medication ordered. 12/30/21 01/26/22 Yes Danford, Suann Larry, MD  ?apixaban (ELIQUIS) 5 MG TABS tablet Take 1 tablet (5 mg total) by mouth 2 (two) times daily. 01/06/22  Yes [place], PA-C  ?atorvastatin (LIPITOR) 40 MG tablet Take 40 mg by mouth daily.   Yes [provider]  ?carisoprodol (SOMA) 350 MG tablet Take 350 mg by mouth at bedtime as needed for muscle spasms.   Yes [provider]  ?cefTRIAXone (ROCEPHIN) IVPB Inject 2 g into the vein every 12 (twelve) hours for 27 days. Indication:  Enterococcal pacemaker infection  ?First Dose: Yes ?Last Day of Therapy:  01/26/22 ?Labs - Once weekly:  CBC/D and BMP, ?Labs - Every other week:  ESR and CRP ?Method of administration: IV Push ?Method of administration may be changed at the discretion of home infusion pharmacist based upon assessment of the patient and/or caregiver's ability to self-administer the medication ordered. 12/30/21 01/26/22 Yes Danford, Suann Larry, MD  ?doxazosin (CARDURA) 2 MG tablet Take 2 mg by mouth at bedtime.   Yes [provider]  ?furosemide (LASIX) 40 MG tablet Take 40 mg by mouth daily.   Yes [provider]  ?IFEREX 150 150 MG capsule Take 150 mg by mouth at bedtime. 10/07/21  Yes [provider]  ?Insulin Aspart Prot & Aspart (NOVOLOG MIX 70/30 [place]) Inject 20-30 Units  into the skin every morning. 30 units in the morning, 20 units at bedtime   Yes [provider]  ?losartan (COZAAR) 100 MG tablet Take 50 mg by mouth daily.   Yes [provider]  ?Multiple Vitamins-Minerals (CENTRUM SILVER ADULT 50+ PO) Take 1 tablet by mouth daily.   Yes [provider]  ?nitroGLYCERIN (NITROSTAT) 0.4 MG SL tablet Place 0.4 mg under the tongue every 5 (five) minutes as needed for chest pain.   Yes [provider]  ?pantoprazole (PROTONIX) 40 MG tablet Take 40 mg by mouth 2 (two) times daily. 11/30/21  Yes [provider]  ?potassium chloride (K-DUR) 10 MEQ tablet Take 30 mEq by mouth daily.   Yes [provider]  ?SYMBICORT 160-4.5 MCG/ACT inhaler Inhale 2 puffs into the lungs in the morning and at bedtime. 12/19/21  Yes [provider]  ?traMADol (ULTRAM) 50 MG tablet Take 1 tablet (50  mg total) by mouth 2 (two) times daily as needed for severe pain. 01/01/22  Yes Nita Sells, MD  ?triamcinolone cream (KENALOG) 0.1 % Apply 1 application. topically 2 (two) times daily as needed (itching). 12/14/21  Yes [provider]  ? ? ?Social History  ? ?Socioeconomic History  ? Marital status: Married  ?  Spouse name: Not on file  ? Number of children: Not on file  ? Years of education: Not on file  ? Highest education level: Not on file  ?Occupational History  ? Not on file  ?Tobacco Use  ? Smoking status: Never  ? Smokeless tobacco: Never  ?Substance and Sexual Activity  ? Alcohol use: No  ?  Alcohol/week: 0.0 standard drinks  ? Drug use: No  ? Sexual activity: Not on file  ?Other Topics Concern  ? Not on file  ?Social History Narrative  ? Not on file  ? ?Social Determinants of Health  ? ?Financial Resource Strain: Not on file  ?Food Insecurity: Not on file  ?Transportation Needs: Not on file  ?Physical Activity: Not on file  ?Stress: Not on file  ?Social Connections: Not on file  ?Intimate Partner Violence: Not on file  ? ? ? ?No  family history on file. ? ?Review of Systems  ?Constitutional: Negative.   ?HENT: Negative.    ?Eyes: Negative.   ?Respiratory: Negative.    ?Gastrointestinal: Negative.   ?Musculoskeletal: Negative.   ?Skin: N

## 2022-01-13 NOTE — Progress Notes (Signed)
Lower extremity venous has been completed.  ? ?Preliminary results in CV Proc.  ? ?Jason Jarvis [name] ?01/13/2022 2:28 PM    ?

## 2022-01-14 ENCOUNTER — Ambulatory Visit: Payer: Medicare HMO

## 2022-01-14 ENCOUNTER — Other Ambulatory Visit: Payer: Medicare HMO

## 2022-01-14 DIAGNOSIS — N400 Enlarged prostate without lower urinary tract symptoms: Secondary | ICD-10-CM | POA: Diagnosis not present

## 2022-01-14 DIAGNOSIS — Z85528 Personal history of other malignant neoplasm of kidney: Secondary | ICD-10-CM | POA: Diagnosis not present

## 2022-01-14 DIAGNOSIS — R7881 Bacteremia: Secondary | ICD-10-CM | POA: Diagnosis not present

## 2022-01-14 DIAGNOSIS — I341 Nonrheumatic mitral (valve) prolapse: Secondary | ICD-10-CM | POA: Diagnosis not present

## 2022-01-14 DIAGNOSIS — Z66 Do not resuscitate: Secondary | ICD-10-CM | POA: Diagnosis not present

## 2022-01-14 DIAGNOSIS — E1169 Type 2 diabetes mellitus with other specified complication: Secondary | ICD-10-CM | POA: Diagnosis not present

## 2022-01-14 DIAGNOSIS — I480 Paroxysmal atrial fibrillation: Secondary | ICD-10-CM | POA: Diagnosis not present

## 2022-01-14 DIAGNOSIS — I452 Bifascicular block: Secondary | ICD-10-CM | POA: Diagnosis not present

## 2022-01-14 DIAGNOSIS — Y838 Other surgical procedures as the cause of abnormal reaction of the patient, or of later complication, without mention of misadventure at the time of the procedure: Secondary | ICD-10-CM | POA: Diagnosis not present

## 2022-01-14 DIAGNOSIS — R001 Bradycardia, unspecified: Secondary | ICD-10-CM | POA: Diagnosis present

## 2022-01-14 DIAGNOSIS — M1711 Unilateral primary osteoarthritis, right knee: Secondary | ICD-10-CM | POA: Diagnosis not present

## 2022-01-14 DIAGNOSIS — I495 Sick sinus syndrome: Secondary | ICD-10-CM | POA: Diagnosis not present

## 2022-01-14 DIAGNOSIS — I69322 Dysarthria following cerebral infarction: Secondary | ICD-10-CM | POA: Diagnosis not present

## 2022-01-14 DIAGNOSIS — I97618 Postprocedural hemorrhage and hematoma of a circulatory system organ or structure following other circulatory system procedure: Secondary | ICD-10-CM | POA: Diagnosis not present

## 2022-01-14 DIAGNOSIS — I110 Hypertensive heart disease with heart failure: Secondary | ICD-10-CM | POA: Diagnosis not present

## 2022-01-14 DIAGNOSIS — E1142 Type 2 diabetes mellitus with diabetic polyneuropathy: Secondary | ICD-10-CM | POA: Diagnosis not present

## 2022-01-14 DIAGNOSIS — I5022 Chronic systolic (congestive) heart failure: Secondary | ICD-10-CM | POA: Diagnosis not present

## 2022-01-14 DIAGNOSIS — J449 Chronic obstructive pulmonary disease, unspecified: Secondary | ICD-10-CM | POA: Diagnosis not present

## 2022-01-14 DIAGNOSIS — E7800 Pure hypercholesterolemia, unspecified: Secondary | ICD-10-CM | POA: Diagnosis not present

## 2022-01-14 DIAGNOSIS — I2510 Atherosclerotic heart disease of native coronary artery without angina pectoris: Secondary | ICD-10-CM | POA: Diagnosis not present

## 2022-01-14 DIAGNOSIS — Y831 Surgical operation with implant of artificial internal device as the cause of abnormal reaction of the patient, or of later complication, without mention of misadventure at the time of the procedure: Secondary | ICD-10-CM | POA: Diagnosis not present

## 2022-01-14 DIAGNOSIS — B952 Enterococcus as the cause of diseases classified elsewhere: Secondary | ICD-10-CM | POA: Diagnosis not present

## 2022-01-14 DIAGNOSIS — D6832 Hemorrhagic disorder due to extrinsic circulating anticoagulants: Secondary | ICD-10-CM | POA: Diagnosis not present

## 2022-01-14 DIAGNOSIS — Y713 Surgical instruments, materials and cardiovascular devices (including sutures) associated with adverse incidents: Secondary | ICD-10-CM | POA: Diagnosis not present

## 2022-01-14 DIAGNOSIS — L7632 Postprocedural hematoma of skin and subcutaneous tissue following other procedure: Secondary | ICD-10-CM | POA: Diagnosis not present

## 2022-01-14 DIAGNOSIS — T8131XA Disruption of external operation (surgical) wound, not elsewhere classified, initial encounter: Secondary | ICD-10-CM | POA: Diagnosis not present

## 2022-01-14 DIAGNOSIS — K219 Gastro-esophageal reflux disease without esophagitis: Secondary | ICD-10-CM | POA: Diagnosis not present

## 2022-01-14 DIAGNOSIS — I69354 Hemiplegia and hemiparesis following cerebral infarction affecting left non-dominant side: Secondary | ICD-10-CM | POA: Diagnosis not present

## 2022-01-14 LAB — BASIC METABOLIC PANEL
Anion gap: 8 (ref 5–15)
BUN: 14 mg/dL (ref 8–23)
CO2: 25 mmol/L (ref 22–32)
Calcium: 9 mg/dL (ref 8.9–10.3)
Chloride: 106 mmol/L (ref 98–111)
Creatinine, Ser: 1.14 mg/dL (ref 0.61–1.24)
GFR, Estimated: >60 mL/min (ref >60)
Glucose, Bld: 121 mg/dL — ABNORMAL HIGH (ref 70–99)
Potassium: 4.3 mmol/L (ref 3.5–5.1)
Sodium: 139 mmol/L (ref 135–145)

## 2022-01-14 LAB — GLUCOSE, CAPILLARY
Glucose-Capillary: 114 mg/dL — ABNORMAL HIGH (ref 70–99)
Glucose-Capillary: 118 mg/dL — ABNORMAL HIGH (ref 70–99)
Glucose-Capillary: 140 mg/dL — ABNORMAL HIGH (ref 70–99)
Glucose-Capillary: 157 mg/dL — ABNORMAL HIGH (ref 70–99)

## 2022-01-14 LAB — CBC
HCT: 30.4 % — ABNORMAL LOW (ref 39.0–52.0)
Hemoglobin: 9.7 g/dL — ABNORMAL LOW (ref 13.0–17.0)
MCH: 27 pg (ref 26.0–34.0)
MCHC: 31.9 g/dL (ref 30.0–36.0)
MCV: 84.7 fL (ref 80.0–100.0)
Platelets: 232 10*3/uL (ref 150–400)
RBC: 3.59 MIL/uL — ABNORMAL LOW (ref 4.22–5.81)
RDW: 15.1 % (ref 11.5–15.5)
WBC: 5 10*3/uL (ref 4.0–10.5)
nRBC: 0 % (ref 0.0–0.2)

## 2022-01-14 MED ORDER — CEFTRIAXONE IV (FOR PTA / DISCHARGE USE ONLY): 2.0000 g | Freq: Two times a day (BID) | INTRAVENOUS | 0 refills | Status: AC

## 2022-01-14 MED ORDER — LOSARTAN POTASSIUM 50 MG PO TABS
100.0000 mg | ORAL_TABLET | Freq: Every day | ORAL | Status: DC
  Administered 2022-01-15: 100 mg via ORAL
  Filled 2022-01-14: qty 2

## 2022-01-14 MED ORDER — AMPICILLIN IV (FOR PTA / DISCHARGE USE ONLY): 12.0000 g | INTRAVENOUS | 0 refills | Status: AC

## 2022-01-14 MED ORDER — LOSARTAN POTASSIUM 100 MG PO TABS: 100.0000 mg | ORAL_TABLET | Freq: Every day | ORAL | Status: AC

## 2022-01-14 MED ORDER — LOSARTAN POTASSIUM 50 MG PO TABS
50.0000 mg | ORAL_TABLET | Freq: Once | ORAL | Status: AC
  Administered 2022-01-14: 50 mg via ORAL
  Filled 2022-01-14: qty 1

## 2022-01-14 NOTE — Discharge Instructions (Addendum)
Leave dressing in place, though OK to change though as needed if dirty as we discussed. ?Do not get wet ?Call Dr. Tanna Furry office if concerns ? ? ?Dear Jason Jarvis, ? ?Thank you for letting us participate in your care. You were hospitalized for a bleed from your operation site, coming from your use of Eliquis. We have stopped your Eliquis, because of your risk of bleeding. Cardiology recommends stopping Eliquis until your appointment with them.  ? ? ?POST-HOSPITAL & CARE INSTRUCTIONS ?Stop Eliquis ?Go to your follow up appointments (listed below) ?Return precautions: ?Seek medical care if begin to have uncontrollable bleeding ?Seek medical care if you notice warmth/tenderness/redness/swelling from operation site ?Seek medical care if you develop fever above 100.4 degrees farenheit ? ? ? ?DOCTOR'S APPOINTMENT   ?Future Appointments  ?Date Time Provider [place]  ?01/17/2022 11:00 AM [facility] DEVICE 1 [facility]  ?01/21/2022  9:30 AM Comer, Okey Regal, MD [facility]  ?01/27/2022 12:20 PM Shirley Friar, PA-C [facility]  ?01/28/2022  2:45 PM Evans Lance, MD [facility]  ? ? Follow-up Information   ? ? [place] Office Follow up.   ?Specialty: Cardiology ?Why: 01/17/22 @ 11:00AM, wound check ?Contact information: ?[address], [address] ?[place] ?[phone] ? ?  ?  ? ? Shirley Friar, PA-C Follow up.   ?Specialty: Physician Assistant ?Why: 01/27/22 @ 12:20PM, wound check ?Contact information: ?[address] ?[place] [address] ?[phone] ? ? ?  ?  ? ? Jeni Salles, MD. Schedule an appointment as soon as possible for a visit in 1 week(s).   ?Specialty: Internal Medicine ?Contact information: ?[address] ?[place] [address] ?[phone] ? ? ?  ?  ? ?  ?  ? ?  ? ? ?Take care and be well! ? ?Family Medicine Teaching Service Inpatient Team ?[place]  ?[facility]  ?[address] [place] [address] ?([phone] ? ? ? ? ?

## 2022-01-14 NOTE — Hospital Course (Addendum)
Jason Jarvis is a 71 y.o. male with PMH SSS s/p PPM x3 due to infxn, CAD s/p cath, DM2, stroke, renal cancer, BLE DVT, PAF no AC, HLD, HTN, CKD**, COPD, BPH, MDD/GAD who presented to [facility] (01/12/2022) for postop bleed of PPM removal (12/29/2021) after vegetation on TEE (12/26/2021). His hospital course is below. ? ? ?Postoperative bleeding s/p pacemaker removal 4/23 while on Eliquis for 5 days  ?Patient was recently started on Eliquis 01/07/2022, for A-fib and lower extremity DVT found incidentally. He was started on Eliquis 9 days after the surgery and was on Eliquis for 5 days before bleeding started. Patient had sudden onset of acute bleed and hematoma at surgical site exacerbated by Eliquis. Cardiology was consulted and requested that patient be admitted overnight for observation. Eliquis was stopped. In the ED the wound was slowly oozing despite Dermabond, required dressing change and pressure to stop bleeding. Bleeding was well controlled during admission, with stable hemoglobin. By discharge patient was asymptomatic for his anemia. Cardiology recommended holding anticoagulation for 2 weeks until follow-up appointment at which time anticoagulation would be reconsidered. ? 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 Currently, patient on IV ampicillin 12 g daily and IV ceftriaxone 2 g every 12 hours.  Antibiotics to go until 5/21.  Patient was continued on antibiotics during hospitalization and remained afebrile with no elevation in WBCs. ? ?All other conditions chronic and stable ? ?HFrEF (50%) PAF  MV prolapse  HTN ?T2DM, well-controlled ?CAD  HLD  CVA ?COPD ?Iron deficiency ?BPH ?Severe pain  muscle spasm ?GERD ? ? ?PCP follow-up items ?Increased Losartan to '100mg'$  daily at  discharge. PCP to recheck creatinine at follow up. ?

## 2022-01-14 NOTE — Progress Notes (Signed)
FPTS Brief Progress Note ? ?S: patient resting ? ? ?O: ?BP (!) 172/89 (BP Location: Left Arm)   Pulse 69   Temp 98.6 ?F (37 ?C) (Axillary)   Resp 16   SpO2 96%   ? ? ?A/P: ?- Orders reviewed. Labs for AM ordered, which was adjusted as needed.  ? ?Gladys Damme, MD ?01/14/2022, 6:31 AM ?PGY-3, [place] [facility] Night Resident  ?Please page [phone] with questions.  ? ? ?

## 2022-01-14 NOTE — Plan of Care (Signed)

## 2022-01-14 NOTE — Plan of Care (Signed)

## 2022-01-14 NOTE — Discharge Summary (Deleted)
Family Medicine Teaching Service ?Hospital Discharge Summary ? ?Patient name: Jason Jarvis Medical record number: [id number] ?Date of birth: [birth year] Age: 71 y.o. Gender: male ?Date of Admission: 01/12/2022  Date of Discharge: 01/14/22 ?Admitting Physician: Merrily Brittle, DO ? ?Primary Care Provider: Jeni Salles, MD ?Consultants: Cardiology ? ?Indication for Hospitalization: Postoperative bleeding ? ?Discharge Diagnoses/Problem List:  ?Postoperative bleeding ? ?Disposition: Home ? ?Discharge Condition: Improved ? ?Discharge Exam:  ?Temp:  [98 ?F (36.7 ?C)-98.9 ?F (37.2 ?C)] 98.6 ?F (37 ?C) (05/09 0500) ?Pulse Rate:  [53-69] 69 (05/09 0500) ?Resp:  [16-19] 16 (05/09 0500) ?BP: (147-191)/(62-89) 172/89 (05/09 0500) ?SpO2:  [96 %-99 %] 96 % (05/08 2025) ?Physical Exam: ?General: Well appearing, NAD, polite, white male ?Cardiovascular: RRR, NRMG ?Respiratory: CTAB ?Abdomen: Soft, nttp, non-distended ?Extremities: No edema, cap refill < 2 sec ? ?Brief Hospital Course:  ?Bernarr Longsworth is a 71 y.o. male with PMH SSS s/p PPM x3 due to infxn, CAD s/p cath, DM2, stroke, renal cancer, BLE DVT, PAF no AC, HLD, HTN, CKD**, COPD, BPH, MDD/GAD who presented to [facility] (01/12/2022) for postop bleed of PPM removal (12/29/2021) after vegetation on TEE (12/26/2021). His hospital course is below. ? ? ?Postoperative bleeding s/p pacemaker removal 4/23 while on Eliquis for 5 days  ?Patient was recently started on Eliquis 01/07/2022, for A-fib and lower extremity DVT found incidentally. He was started on Eliquis 9 days after the surgery and was on Eliquis for 5 days before bleeding started. Patient had sudden onset of acute bleed and hematoma at surgical site exacerbated by Eliquis. Cardiology was consulted and requested that patient be admitted overnight for observation. Eliquis was stopped. In the ED the wound was slowly oozing despite Dermabond, required dressing change and pressure to stop bleeding. Bleeding was well controlled during  admission, with stable hemoglobin. By discharge patient was asymptomatic for his anemia. Cardiology recommended holding anticoagulation for 2 weeks until follow-up appointment at which time anticoagulation would be reconsidered. ? 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 Currently, patient on IV ampicillin 12 g daily and IV ceftriaxone 2 g every 12 hours.  Antibiotics to go until 5/21.  Patient was continued on antibiotics during hospitalization and remained afebrile with no elevation in WBCs. ? ?All other conditions chronic and stable ? ?HFrEF (50%) PAF  MV prolapse  HTN ?T2DM, well-controlled ?CAD  HLD  CVA ?COPD ?Iron deficiency ?BPH ?Severe pain  muscle spasm ?GERD ? ? ?PCP follow-up items ?Increased Losartan to 125m daily at discharge. PCP to recheck creatinine at follow up. ? ? ?Significant Procedures: None ? ?Significant Labs and Imaging:  ?Recent Labs  ?Lab 01/12/22 ?1239 01/13/22 ?0536405/09/23 ?0149  ?WBC 4.2 4.8 5.0  ?HGB 9.4* 9.9* 9.7*  ?HCT 29.8* 31.0* 30.4*  ?PLT 233 228 232  ? ?Recent Labs  ?Lab 01/12/22 ?1239 01/13/22 ?0680305/09/23 ?0149  ?NA 139 141 139  ?K 3.7 3.4* 4.3  ?CL 104 110 106  ?CO2 _0 ?GLUCOSE 161* 97 121*  ?BUN _1 ?CREATININE 0.99 0.86 1.14  ?CALCIUM 8.4* 7.9* 9.0  ? ? ? ? ?Results/Tests Pending at Time of Discharge:  ? ?Discharge Medications:  ?Allergies as of 01/14/2022   ? ?   Reactions  ? Ativan [lorazepam] Other (See Comments)  ? Hallucinated /  Confused / Combative  ? Keflex [cephalexin] Hives  ? Ace Inhibitors Cough  ?   ? Prednisone Rash  ? Sulfa Antibiotics Rash  ? ?  ? ?  ?Medication List  ?  ? ?STOP taking these medications   ? ?apixaban 5 MG Tabs tablet ?Commonly known as: ELIQUIS ?  ?traMADol 50 MG tablet ?Commonly known as:  ULTRAM ?  ? ?  ? ?TAKE these medications   ? ?acetaminophen 500 MG tablet ?Commonly known as: TYLENOL ?Take 2 tablets (1,000 mg total) by mouth every 6 (six) hours as needed. ?What changed: how much to take ?  ?amLODipine 2.5 MG tablet ?Commonly known as: NORVASC ?  ?ampicillin  IVPB ?Inject 12 g into the vein daily for 12 days. As a continuous infusion. Indication:  Enterococcal pacemaker infection  ?First Dose: Yes ?Last Day of Therapy:  01/26/22 ?Labs - Once weekly:  CBC/D and BMP, ?Labs - Every other week:  ESR and CRP ?Method of administration: Ambulatory Pump (Continuous Infusion) ?Method of administration may be changed at the discretion of home infusion pharmacist based upon assessment of the patient and/or caregiver's ability to self-administer the medication ordered. ?  ?atorvastatin 40 MG tablet ?Commonly known as: LIPITOR ?Take 40 mg by mouth daily. ?  ?carisoprodol 350 MG tablet ?Commonly known as: SOMA ?Take 350 mg by mouth at bedtime as needed for muscle spasms. ?  ?cefTRIAXone  IVPB ?Commonly known as: ROCEPHIN ?Inject 2 g into the vein every 12 (twelve) hours for 12 days. Indication:  Enterococcal pacemaker infection  ?First Dose: Yes ?Last Day of Therapy:  01/26/22 ?Labs - Once weekly:  CBC/D and BMP, ?Labs - Every other week:  ESR and CRP ?Method of administration: IV Push ?Method of administration may be changed at the discretion of home infusion pharmacist based upon assessment of the patient and/or caregiver's ability to self-administer the medication ordered. ?  ?CENTRUM SILVER ADULT 50+ PO ?Take 1 tablet by mouth daily. ?  ?doxazosin 2 MG tablet ?Commonly known as: CARDURA ?Take 2 mg by mouth at bedtime. ?  ?furosemide 40 MG tablet ?Commonly known as: LASIX ?Take 40 mg by mouth daily. ?  ?IFerex 150 150 MG capsule ?Generic drug: iron polysaccharides ?Take 150 mg by mouth at bedtime. ?  ?losartan 100 MG tablet ?Commonly known as: COZAAR ?Take 1 tablet (100 mg total) by mouth daily. ?What  changed: how much to take ?  ?nitroGLYCERIN 0.4 MG SL tablet ?Commonly known as: NITROSTAT ?Place 0.4 mg under the tongue every 5 (five) minutes as needed for chest pain. ?  ?NOVOLOG MIX 70/30 [place] ?Inject 20-30 Units into the skin every morning. 30 units in the morning, 20 units at bedtime ?  ?pantoprazole 40 MG tablet ?Commonly known as: PROTONIX ?Take 40 mg by mouth 2 (two) times daily. ?  ?potassium chloride 10 MEQ tablet ?Commonly known as: KLOR-CON ?Take 30 mEq by mouth daily. ?  ?Symbicort 160-4.5 MCG/ACT inhaler ?Generic drug: budesonide-formoterol ?Inhale 2 puffs into the lungs in the morning and at bedtime. ?  ?triamcinolone cream 0.1 % ?Commonly known as: KENALOG ?Apply 1 application. topically 2 (two) times daily as needed (itching). ?  ? ?  ? ?  ?  ? ? ?  ?Discharge Care Instructions  ?(From admission, onward)  ?  ? ? ?  ? ?  Start     Ordered  ? 01/14/22 0000  Change dressing on IV access line weekly and PRN  (Home infusion instructions - [facility] )       ?  01/14/22 1526  ? ?  ?  ? ?  ? ? ?Discharge Instructions: Please refer to Patient Instructions section of EMR for full details.  Patient was counseled important signs and symptoms that should prompt return to medical care, changes in medications, dietary instructions, activity restrictions, and follow up appointments.  ? ?Follow-Up Appointments: ? Follow-up Information   ? ? [facility] Follow up.   ?Specialty: Cardiology ?Why: 01/17/22 @ 11:00AM, wound check ?Contact information: ?[address], [address] ?[place] ?[phone] ? ?  ?  ? ? Shirley Friar, PA-C Follow up.   ?Specialty: Physician Assistant ?Why: 01/27/22 @ 12:20PM, wound check ?Contact information: ?[address] ?[place] [address] ?[phone] ? ? ?  ?  ? ? Jeni Salles, MD. Schedule an appointment as soon as possible for a visit in 1 week(s).   ?Specialty: Internal Medicine ?Contact  information: ?[address] ?[place] [address] ?[phone] ? ? ?  ?  ? ?  ?  ? ?  ? ? ?Holley Bouche, MD ?01/14/2022, 3:46 PM ?PGY-1, [place] ? ?

## 2022-01-14 NOTE — Progress Notes (Signed)
Family Medicine Teaching Service ?Daily Progress Note ?Intern Pager: [phone] ? ?Patient name: Jason Jarvis Medical record number: [id number] ?Date of birth: [birth year] Age: 71 y.o. Gender: male ? ?Primary Care Provider: Jeni Salles, MD ?Consultants: Cardiology, VVS ?Code Status: DNR ? ?Pt Overview and Major Events to Date:  ?5/7: - Pt admitted ? ?Assessment and Plan: ? ?Jason Jarvis is a 71 y.o. male with PMH SSS s/p PPM x3 due to infxn, CAD s/p cath, DM2, stroke, renal cancer, BLE DVT, PAF no AC, HLD, HTN, CKD**, COPD, BPH, MDD/GAD who presented to [facility] (01/12/2022) for postop bleed of PPM removal (12/29/2021) after vegetation on TEE (12/26/2021). ?  ?Patient was recently started on Eliquis 01/07/2022 after surgery for A-fib and bilateral lower extremity DVT found incidentally. ? ?Postoperative bleeding s/p pacemaker removal 4/23 while on Eliquis for 5 days ?Hgb stable today at 9.7, patient remains asymptomatic with stable vitals. DVT study yesterday negative for thrombus, cardiology recommending holding anticoagulation for 2 weeks, to see patient in clinic and consider restarting anticoagulation. Patient stable for d/c ? 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 Patient received all anti hypertensive medication yesterday, may need further augmentation, recommend increasing losartan to 100 mg. Patient remains afebrile without signs of acute illness.  ?-Continue IV ampicillin 12g daily through 5/21 ?-Continue CFTX 2g Bid through 5/21 ?-Lasix 40 mg daily, losartan 100 mg daily, amlodipine 2.5 mg daily ? ?LLE DVT in the left posterior tib (resolved) ?Hx renal cancer per chart ?No DVT on U/S yesterday, normal leg exam. ? ?T2DM, well-controlled ?Gluc well controlled 120-160's ?-sSSI ?-Lantus 15 u daily ? ?All other conditions chronic and stable ?CAD  HLD  CVA -Atorvastatin 40 mg daily ?COPD  -Dulera ?Iron deficiency - Iron supp ?BPH -Doxazosin 2 mg qhs ?Severe pain  muscle spasm -Tramadol 50 mg Bid prn, carisoprodol 350 mg qhs prn ?GERD - protonix ? ?FEN/GI: Regular diet ?PPx: SCD's ?Dispo:Home today.  ? ?Subjective:  ?Patient doing well, with no complaints this morning ? ?Objective: ?Temp:  [98 ?F (36.7 ?C)-98.9 ?F (37.2 ?C)] 98.6 ?F (37 ?C) (05/09 0500) ?Pulse Rate:  [53-69] 69 (05/09 0500) ?Resp:  [16-19] 16 (05/09 0500) ?BP: (147-191)/(62-89) 172/89 (05/09 0500) ?SpO2:  [96 %-99 %] 96 % (05/08 2025) ?Physical Exam: ?General: Well appearing, NAD, polite, white male ?Cardiovascular: RRR, NRMG ?Respiratory: CTAB ?Abdomen: Soft, nttp, non-distended ?Extremities: No edema, cap refill < 2 sec ? ?Laboratory: ?Recent Labs  ?Lab 01/12/22 ?1239 01/13/22 ?3710 01/14/22 ?0149  ?WBC 4.2 4.8 5.0  ?HGB 9.4* 9.9* 9.7*  ?HCT 29.8* 31.0* 30.4*  ?PLT 233 228 232  ? ?Recent Labs  ?Lab 01/12/22 ?1239 01/13/22 ?6269 01/14/22 ?0149  ?NA 139 141 139  ?K 3.7 3.4* 4.3  ?CL 104 110 106  ?CO2 '26 24 25  '$ ?BUN '9 8 14  '$ ?CREATININE 0.99 0.86 1.14  ?CALCIUM 8.4* 7.9* 9.0  ?GLUCOSE 161* 97 121*  ? ? ? ? ?Imaging/Diagnostic Tests: ? ? ?Holley Bouche, MD ?01/14/2022, 5:15 AM ?PGY-1, [facility] ?[place] Intern pager: [phone], text pages welcome ? ?

## 2022-01-14 NOTE — Care Management (Signed)
01-14-22 1052 Case Manager reached out to [place] to make them aware of possible transition home.  ?

## 2022-01-14 NOTE — Progress Notes (Addendum)
? ?Progress Note ? ?Patient Name: Jason Jarvis ?Date of Encounter: 01/14/2022 ? ?[facility] Cardiologist: Dr. Gerarda Gunther ([place], [facility]) ? ?Subjective  ? ?No complaints, no pain at site, no CP, no SOB ? ?Inpatient Medications  ?  ?Scheduled Meds: ? amLODipine  2.5 mg Oral Daily  ? atorvastatin  40 mg Oral QHS  ? Chlorhexidine Gluconate Cloth  6 each Topical Q0600  ? doxazosin  2 mg Oral QHS  ? furosemide  40 mg Oral Daily  ? insulin aspart  0-9 Units Subcutaneous TID WC  ? insulin glargine-yfgn  15 Units Subcutaneous Daily  ? iron polysaccharides  150 mg Oral QHS  ? losartan  50 mg Oral Daily  ? mometasone-formoterol  2 puff Inhalation BID  ? pantoprazole  40 mg Oral BID  ? potassium chloride  30 mEq Oral Daily  ? ?Continuous Infusions: ? ampicillin (OMNIPEN) IV 2 g (01/14/22 0734)  ? cefTRIAXone (ROCEPHIN)  IV Stopped (01/13/22 2202)  ? ?PRN Meds: ?  ? ?Vital Signs  ?  ?Vitals:  ? 01/14/22 0134 01/14/22 0500 01/14/22 0748 01/14/22 0806  ?BP: (!) 191/71 (!) 172/89 (!) 181/65   ?Pulse: 65 69 (!) 50   ?Resp: '17 16 15   '$ ?Temp: 98.3 ?F (36.8 ?C) 98.6 ?F (37 ?C) 98.9 ?F (37.2 ?C)   ?TempSrc: Axillary Axillary Oral   ?SpO2:   99% 100%  ? ? ?Intake/Output Summary (Last 24 hours) at 01/14/2022 0814 ?Last data filed at 01/14/2022 0556 ?Gross per 24 hour  ?Intake 200 ml  ?Output --  ?Net 200 ml  ? ? ?  01/01/2022  ?  5:03 AM 12/31/2021  ?  4:24 AM 12/30/2021  ?  4:56 AM  ?Last 3 Weights  ?Weight (lbs) 194 lb 0.1 oz 203 lb 14.8 oz 199 lb 11.8 oz  ?Weight (kg) 88 kg 92.5 kg 90.6 kg  ?   ? ?Telemetry  ?  ?SB/SR, largelt 50's, raging 40's-80's, qst degree AVblock - Personally Reviewed ? ?ECG  ?  ?No new EKGs - Personally Reviewed ? ?Physical Exam  ? ?GEN: No acute distress.   ?Neck: No JVD ?Cardiac: RRR, no murmurs, rubs, or gallops.  ?Respiratory: CTA b/l. ?GI: Soft, nontender, non-distended  ?MS: No edema; No deformity. ?Neuro:  Nonfocal  ?Psych: Normal affect  ? ?Old bruising at extraction site ?Active oozing yesterday through the  day, dark, old blood. ? ?Labs  ?  ?High Sensitivity Troponin:  No results for input(s): TROPONINIHS in the last 720 hours.   ?Chemistry ?Recent Labs  ?Lab 01/12/22 ?1239 01/13/22 ?4696 01/14/22 ?0149  ?NA 139 141 139  ?K 3.7 3.4* 4.3  ?CL 104 110 106  ?CO2 '26 24 25  '$ ?GLUCOSE 161* 97 121*  ?BUN '9 8 14  '$ ?CREATININE 0.99 0.86 1.14  ?CALCIUM 8.4* 7.9* 9.0  ?GFRNONAA >60 >60 >60  ?ANIONGAP '9 7 8  '$ ?  ?Lipids No results for input(s): CHOL, TRIG, HDL, LABVLDL, LDLCALC, CHOLHDL in the last 168 hours.  ?Hematology ?Recent Labs  ?Lab 01/12/22 ?1239 01/13/22 ?2952 01/14/22 ?0149  ?WBC 4.2 4.8 5.0  ?RBC 3.49* 3.71* 3.59*  ?HGB 9.4* 9.9* 9.7*  ?HCT 29.8* 31.0* 30.4*  ?MCV 85.4 83.6 84.7  ?MCH 26.9 26.7 27.0  ?MCHC 31.5 31.9 31.9  ?RDW 14.8 14.9 15.1  ?PLT 233 228 232  ? ?Thyroid No results for input(s): TSH, FREET4 in the last 168 hours.  ?BNPNo results for input(s): BNP, PROBNP in the last 168 hours.  ?DDimer No results for input(s):  DDIMER in the last 168 hours.  ? ?Radiology  ?  ?VAS [place] LOWER EXTREMITY VENOUS (DVT) ? ?Result Date: 01/13/2022 ? Lower Venous DVT Study Patient Name:  Jason Jarvis  Date of Exam:   01/13/2022 Medical Rec #: [id number]      Accession #:    [id number] Date of Birth: 1950-09-09      Patient Gender: M Patient Age:   71 years Exam Location:  [facility] Procedure:      VAS [place] LOWER EXTREMITY VENOUS (DVT) Referring Phys: TODD MCDIARMID --------------------------------------------------------------------------------  Indications: Phlebitis/thrombophlebitis.  Comparison Study: 12/22/21 prior Performing Technologist: Archie Patten RVS  Examination Guidelines: A complete evaluation includes B-mode imaging, spectral Doppler, color Doppler, and power Doppler as needed of all accessible portions of each vessel. Bilateral testing is considered an integral part of a complete examination. Limited examinations for reoccurring indications may be performed as noted. The reflux portion of the exam is performed  with the patient in reverse Trendelenburg.  +---------+---------------+---------+-----------+----------+--------------+ RIGHT    CompressibilityPhasicitySpontaneityPropertiesThrombus Aging +---------+---------------+---------+-----------+----------+--------------+ CFV      Full           Yes      Yes                                 +---------+---------------+---------+-----------+----------+--------------+ SFJ      Full                                                        +---------+---------------+---------+-----------+----------+--------------+ FV Prox  Full                                                        +---------+---------------+---------+-----------+----------+--------------+ FV Mid   Full                                                        +---------+---------------+---------+-----------+----------+--------------+ FV DistalFull                                                        +---------+---------------+---------+-----------+----------+--------------+ PFV      Full                                                        +---------+---------------+---------+-----------+----------+--------------+ POP      Full           Yes      Yes                                 +---------+---------------+---------+-----------+----------+--------------+ PTV  Full                                                        +---------+---------------+---------+-----------+----------+--------------+ PERO     Full                                                        +---------+---------------+---------+-----------+----------+--------------+   +---------+---------------+---------+-----------+----------+--------------+ LEFT     CompressibilityPhasicitySpontaneityPropertiesThrombus Aging +---------+---------------+---------+-----------+----------+--------------+ CFV      Full           Yes      Yes                                  +---------+---------------+---------+-----------+----------+--------------+ SFJ      Full                                                        +---------+---------------+---------+-----------+----------+--------------+ FV Prox  Full                                                        +---------+---------------+---------+-----------+----------+--------------+ FV Mid   Full                                                        +---------+---------------+---------+-----------+----------+--------------+ FV DistalFull                                                        +---------+---------------+---------+-----------+----------+--------------+ PFV      Full                                                        +---------+---------------+---------+-----------+----------+--------------+ POP      Full           Yes      Yes                                 +---------+---------------+---------+-----------+----------+--------------+ PTV      Full                                                        +---------+---------------+---------+-----------+----------+--------------+  PERO     Full                                                        +---------+---------------+---------+-----------+----------+--------------+     Summary: RIGHT: - There is no evidence of deep vein thrombosis in the lower extremity.  - No cystic structure found in the popliteal fossa.  LEFT: - Findings appear improved from previous examination. - There is no evidence of deep vein thrombosis in the lower extremity.  - No cystic structure found in the popliteal fossa.  *See table(s) above for measurements and observations. Electronically signed by Servando Snare MD on 01/13/2022 at 7:53:49 PM.    Final    ? ?Cardiac Studies  ? ?12/26/2021: TEE ? 1. Dual chamber pacemaker seen. Possible tiny vegetation on RA lead. At  ?the distal aspect of the RV lead, there is an independently mobile,   ?heterogeneous echo density that may be partially calcified. 1 x 0.7 cm. In  ?the setting of bacteremia, this is  ?most consistent with pacemaker lead vegetation. Right ventricular systolic  ?function is normal. The right ventricular size is normal.  ? 2. Left ventricular ejection fraction, by estimation, is 60 to 65%. The  ?left ventricle has normal f

## 2022-01-15 DIAGNOSIS — T8131XA Disruption of external operation (surgical) wound, not elsewhere classified, initial encounter: Secondary | ICD-10-CM | POA: Diagnosis not present

## 2022-01-15 DIAGNOSIS — R001 Bradycardia, unspecified: Secondary | ICD-10-CM | POA: Diagnosis not present

## 2022-01-15 DIAGNOSIS — I97618 Postprocedural hemorrhage and hematoma of a circulatory system organ or structure following other circulatory system procedure: Secondary | ICD-10-CM | POA: Diagnosis not present

## 2022-01-15 LAB — GLUCOSE, CAPILLARY
Glucose-Capillary: 102 mg/dL — ABNORMAL HIGH (ref 70–99)
Glucose-Capillary: 173 mg/dL — ABNORMAL HIGH (ref 70–99)

## 2022-01-15 NOTE — TOC Transition Note (Addendum)
Transition of Care (TOC) - CM/SW Discharge Note ? ? ?Patient Details  ?Name: Jason Jarvis ?MRN: [id number] ?Date of Birth: 1951-08-31 ? ?Transition of Care (TOC) CM/SW Contact:  ?Carles Collet, RN ?Phone Number: ?01/15/2022, 9:02 AM ? ? ?Clinical Narrative:    ?Received call from resident asking if patient's home iv abx are set up. Reached out to [place]. ?Patient will need 12 oclock dose here and wife will assume home administration for 4pm dose per Pam w [facility]. Bedside nurse aware of plan ?[facility] agreeable with plan. ?No other TOC needs identified.  ? ? ? ?Final next level of care: [facility] ?Barriers to Discharge: No Barriers Identified ? ? ?Patient Goals and CMS Choice ?Patient states their goals for this hospitalization and ongoing recovery are:: to return home with IV antibiotics ?  ?Choice offered to / list presented to : Patient, Spouse ? ?Discharge Placement ?  ?           ?  ?  ?  ?  ? ?Discharge Plan and Services ?In-house Referral: NA ?Discharge Planning Services: CM Consult ?Post Acute Care Choice: Durable Medical Equipment, Home Health, Resumption of Svcs/PTA Provider          ?DME Arranged: IV pump/equipment ?DME Agency: Other - Comment ([facility]) ?Date DME Agency Contacted: 01/13/22 ?Time DME Agency Contacted: [phone] ?Representative spoke with at DME Agency: Pam ?[place] Arranged: RN, Disease Management, IV Antibiotics ?[place] Agency: [place] ?Date HH Agency Contacted: 01/13/22 ?Time [place]: 4132 ?Representative spoke with at [place]: Tommi Rumps ? ?Social Determinants of Health (SDOH) Interventions ?  ? ? ?Readmission Risk Interventions ?   ? View : No data to display.  ?  ?  ?  ? ? ? ? ? ?

## 2022-01-15 NOTE — Discharge Summary (Signed)
Family Medicine Teaching Service ?Hospital Discharge Summary ? ?Patient name: Jason Jarvis Medical record number: [id number] ?Date of birth: [birth year] Age: 71 y.o. Gender: male ?Date of Admission: 01/12/2022  Date of Discharge: 01/15/22 ?Admitting Physician: Merrily Brittle, DO ? ?Primary Care Provider: Jeni Salles, MD ?Consultants: Cardiology, VVS ? ?Indication for Hospitalization: Postoperative bleeding ? 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 ?Disposition: Home ? ?Discharge Condition: Improved ? ?Discharge Exam:  ?Temp:  [98.4 ?F (36.9 ?C)-98.9 ?F (37.2 ?C)] 98.4 ?F (36.9 ?C) (05/10 0507) ?Pulse Rate:  [50-60] 58 (05/10 0507) ?Resp:  [15-18] 18 (05/10 0507) ?BP: (136-181)/(65-70) 151/70 (05/10 0507) ?SpO2:  [97 %-100 %] 97 % (05/10 0507) ?Physical Exam: ?General: Well appearing, NAD, polite, white male ?Cardiovascular: RRR, NRMG ?Respiratory: CTABL ?Abdomen: Soft, nttp, non-distended ?Extremities: no edema, cap refill < 2 sec ? ?Brief Hospital Course:  ?Jason Jarvis is a 71 y.o. male with PMH SSS s/p PPM x3 due to infxn, CAD s/p cath, DM2, stroke, renal cancer, BLE DVT, PAF no AC, HLD, HTN, CKD**, COPD, BPH, MDD/GAD who presented to [facility] (01/12/2022) for postop bleed of PPM removal (12/29/2021) after vegetation on TEE (12/26/2021). His hospital course is below. ? ? ?Postoperative bleeding s/p pacemaker removal 4/23 while on Eliquis for 5 days  ?Patient was recently started on Eliquis 01/07/2022, for A-fib and lower extremity DVT found incidentally. He was started on Eliquis 9 days after the surgery and was on Eliquis for 5 days before bleeding started. Patient had sudden onset of acute bleed and hematoma at surgical site exacerbated by  Eliquis. Cardiology was consulted and requested that patient be admitted overnight for observation. Eliquis was stopped. In the ED the wound was slowly oozing despite Dermabond, required dressing change and pressure to stop bleeding. Bleeding was well controlled during admission, with stable hemoglobin. By discharge patient was asymptomatic for his anemia. Cardiology recommended holding anticoagulation for 2 weeks until follow-up appointment at which time anticoagulation would be reconsidered. ? 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 Currently, patient on IV ampicillin 12 g daily and IV ceftriaxone 2 g every 12 hours.  Antibiotics to go until 5/21.  Patient was continued on antibiotics during hospitalization and remained afebrile with no elevation in WBCs. ? ?All other conditions chronic and stable ? ?HFrEF (50%) PAF  MV prolapse  HTN ?T2DM, well-controlled ?CAD  HLD  CVA ?COPD ?Iron deficiency ?BPH ?Severe pain  muscle spasm ?GERD ? ? ?PCP follow-up items ?Increased Losartan to 15m daily at discharge. PCP to recheck creatinine at follow up. ? ? ?Significant Procedures: None ? ?Significant Labs and Imaging:  ?Recent Labs  ?Lab 01/12/22 ?1239 01/13/22 ?0109305/09/23 ?0149  ?WBC 4.2 4.8 5.0  ?HGB 9.4* 9.9* 9.7*  ?HCT 29.8* 31.0* 30.4*  ?PLT 233 228 232  ? ?Recent Labs  ?Lab 01/12/22 ?1239 01/13/22 ?0235505/09/23 ?0149  ?NA 139 141 139  ?K 3.7 3.4* 4.3  ?CL 104 110 106  ?CO2 26 24 25   ?GLUCOSE 161* 97 121*  ?BUN 9 8 14   ?  CREATININE 0.99 0.86 1.14  ?CALCIUM 8.4* 7.9* 9.0  ? ? ? ? ?Results/Tests Pending at Time of Discharge: None ? ?Discharge Medications:  ?Allergies as of 01/15/2022   ? ?   Reactions  ? Ativan [lorazepam] Other (See Comments)  ? Hallucinated / Confused / Combative   ? Keflex [cephalexin] Hives  ? Ace Inhibitors Cough  ?   ? Prednisone Rash  ? Sulfa Antibiotics Rash  ? ?  ? ?  ?Medication List  ?  ? ?STOP taking these medications   ? ?apixaban 5 MG Tabs tablet ?Commonly known as: ELIQUIS ?  ?traMADol 50 MG tablet ?Commonly known as: ULTRAM ?  ? ?  ? ?TAKE these medications   ? ?acetaminophen 500 MG tablet ?Commonly known as: TYLENOL ?Take 2 tablets (1,000 mg total) by mouth every 6 (six) hours as needed. ?What changed: how much to take ?  ?amLODipine 2.5 MG tablet ?Commonly known as: NORVASC ?  ?ampicillin  IVPB ?Inject 12 g into the vein daily for 12 days. As a continuous infusion. Indication:  Enterococcal pacemaker infection  ?First Dose: Yes ?Last Day of Therapy:  01/26/22 ?Labs - Once weekly:  CBC/D and BMP, ?Labs - Every other week:  ESR and CRP ?Method of administration: Ambulatory Pump (Continuous Infusion) ?Method of administration may be changed at the discretion of home infusion pharmacist based upon assessment of the patient and/or caregiver's ability to self-administer the medication ordered. ?  ?atorvastatin 40 MG tablet ?Commonly known as: LIPITOR ?Take 40 mg by mouth daily. ?  ?carisoprodol 350 MG tablet ?Commonly known as: SOMA ?Take 350 mg by mouth at bedtime as needed for muscle spasms. ?  ?cefTRIAXone  IVPB ?Commonly known as: ROCEPHIN ?Inject 2 g into the vein every 12 (twelve) hours for 12 days. Indication:  Enterococcal pacemaker infection  ?First Dose: Yes ?Last Day of Therapy:  01/26/22 ?Labs - Once weekly:  CBC/D and BMP, ?Labs - Every other week:  ESR and CRP ?Method of administration: IV Push ?Method of administration may be changed at the discretion of home infusion pharmacist based upon assessment of the patient and/or caregiver's ability to self-administer the medication ordered. ?  ?CENTRUM SILVER ADULT 50+ PO ?Take 1 tablet by mouth daily. ?  ?doxazosin 2 MG tablet ?Commonly known as: CARDURA ?Take 2 mg by mouth at bedtime. ?  ?furosemide 40 MG  tablet ?Commonly known as: LASIX ?Take 40 mg by mouth daily. ?  ?IFerex 150 150 MG capsule ?Generic drug: iron polysaccharides ?Take 150 mg by mouth at bedtime. ?  ?losartan 100 MG tablet ?Commonly known as: COZAAR ?Take 1 tablet (100 mg total) by mouth daily. ?What changed: how much to take ?  ?nitroGLYCERIN 0.4 MG SL tablet ?Commonly known as: NITROSTAT ?Place 0.4 mg under the tongue every 5 (five) minutes as needed for chest pain. ?  ?NOVOLOG MIX 70/30 [place] ?Inject 20-30 Units into the skin every morning. 30 units in the morning, 20 units at bedtime ?  ?pantoprazole 40 MG tablet ?Commonly known as: PROTONIX ?Take 40 mg by mouth 2 (two) times daily. ?  ?potassium chloride 10 MEQ tablet ?Commonly known as: KLOR-CON ?Take 30 mEq by mouth daily. ?  ?Symbicort 160-4.5 MCG/ACT inhaler ?Generic drug: budesonide-formoterol ?Inhale 2 puffs into the lungs in the morning and at bedtime. ?  ?triamcinolone cream 0.1 % ?Commonly known as: KENALOG ?Apply 1 application. topically 2 (two) times daily as needed (itching). ?  ? ?  ? ?  ?  ? ? ?  ?  Discharge Care Instructions  ?(From admission, onward)  ?  ? ? ?  ? ?  Start     Ordered  ? 01/14/22 0000  Change dressing on IV access line weekly and PRN  (Home infusion instructions - [facility] )       ? 01/14/22 1526  ? ?  ?  ? ?  ? ? ?Discharge Instructions: Please refer to Patient Instructions section of EMR for full details.  Patient was counseled important signs and symptoms that should prompt return to medical care, changes in medications, dietary instructions, activity restrictions, and follow up appointments.  ? ?Follow-Up Appointments: ? Follow-up Information   ? ? [place] [facility] Follow up.   ?Specialty: Cardiology ?Why: 01/17/22 @ 11:00AM, wound check ?Contact information: ?[address], [address] ?[place] ?[phone] ? ?  ?  ? ? Shirley Friar, PA-C Follow up.   ?Specialty: Physician Assistant ?Why: 01/27/22  @ 12:20PM, wound check ?Contact information: ?[address] ?[place] [address] ?[phone] ? ? ?  ?  ? ? Jeni Salles, MD. Schedule an appointment as soon as possible for a visit in 1 w

## 2022-01-15 NOTE — Progress Notes (Signed)
FPTS Brief Note ?Reviewed patient's vitals, recent notes.  ?Vitals:  ? 01/14/22 2051 01/15/22 0000  ?BP: (!) 161/65 140/70  ?Pulse: (!) 55 60  ?Resp: 18 18  ?Temp: 98.6 ?F (37 ?C) 98.6 ?F (37 ?C)  ?SpO2: 97% 98%  ? ?At this time, no change in plan from day progress note.  ?Gladys Damme, MD ?Page [phone] with questions about this patient.  ? ? ?

## 2022-01-15 NOTE — Progress Notes (Addendum)
Slight oozing last night, dressed by RN, looks good. ?Dr. Lovena Le evaluated, did not recommend dressing change to leave in place. ?He has early wound check in place at the office. ? ?BP has been up ?ARB was titrated by medicine team. ? ?Reminder please, no nodal blocking agents, no clonidine. ? ?OK for discharge from [place]. ?Will address Eliquis out patient, probably resume in a couple weeks ? ?NO Eliquis at discharge ? ?Tommye Standard, PA-C ? ?EP Attending ? ?Patient seen and examined. Minimal ooze. Agree with DC recs. Hold all blood thinners. ? ?[name], MD ? ? ?

## 2022-01-17 ENCOUNTER — Ambulatory Visit: Payer: Medicare HMO

## 2022-01-17 DIAGNOSIS — T82837A Hemorrhage of cardiac prosthetic devices, implants and grafts, initial encounter: Secondary | ICD-10-CM

## 2022-01-17 NOTE — Patient Instructions (Addendum)
Please continue to take all medications as prescribed and to follow up with all your scheduled medical appointments. Please call the device clinic if you have any additional questions or concerns. Your wound may continue to minimally drain dark brick red old blood.  ?

## 2022-01-17 NOTE — Progress Notes (Signed)
Wound check appointment s/p PPM extraction 12/29/21 with development of hematoma and site bleeding 01/12/22 after eliquis restarted. Discharged 01/15/22 with follow up today in device clinic. Moderate soft hematoma noted. Steristrips intact. AChalmers Cater, PA in to assess. Order received to gently express hematoma as patient tolerates. Copious amt of thick dark brick red blood expressed. Hematoma mostly resolved. Clean dressing applied over steristrips. Patient and wife aware small amt of oozing my occur. Follow up appointment with Dr. Lovena Le 01/28/22. ? ? ?

## 2022-01-21 ENCOUNTER — Encounter: Payer: Self-pay | Admitting: Internal Medicine

## 2022-01-21 ENCOUNTER — Ambulatory Visit: Payer: Medicare HMO | Admitting: Internal Medicine

## 2022-01-21 ENCOUNTER — Other Ambulatory Visit: Payer: Self-pay

## 2022-01-21 ENCOUNTER — Other Ambulatory Visit: Payer: Self-pay | Admitting: Physician Assistant

## 2022-01-21 VITALS — BP 179/80 | HR 59 | Temp 97.5°F | Wt 192.0 lb

## 2022-01-21 DIAGNOSIS — T827XXD Infection and inflammatory reaction due to other cardiac and vascular devices, implants and grafts, subsequent encounter: Secondary | ICD-10-CM

## 2022-01-21 DIAGNOSIS — Z5181 Encounter for therapeutic drug level monitoring: Secondary | ICD-10-CM | POA: Diagnosis not present

## 2022-01-21 DIAGNOSIS — Z452 Encounter for adjustment and management of vascular access device: Secondary | ICD-10-CM | POA: Diagnosis not present

## 2022-01-21 DIAGNOSIS — Z79899 Other long term (current) drug therapy: Secondary | ICD-10-CM

## 2022-01-21 NOTE — Assessment & Plan Note (Signed)
Reviewed labs with him and no issues with WBC, creat wnl.   ?

## 2022-01-21 NOTE — Progress Notes (Signed)
? ?  Subjective:  ? ? Patient ID: Jason Jarvis, male    DOB: 1950/09/16, 71 y.o.   MRN: [id number] ? ?HPI ?Here for hsfu for pacemaker infection ?He had a pacemaker for sick sinus syndrome with previous issues with the pacemaker and found to have a lead vegatation and bacteremia with Enterococcus faecalis.  The pacemaker was removed and no plan for reimplantation.  TEE was negative for valvular vegetation.  Plan for 4 weeks of antibiotics with the last day planned for 01/26/22.  He is doing well, no rash, no diarrhea.  No fever or chills.  Labs reviewed with him and WBC normal, creat normal.   ? ? ?Review of Systems  ?Constitutional:  Negative for chills and fever.  ?Gastrointestinal:  Negative for diarrhea and nausea.  ?Skin:  Negative for rash.  ? ?   ?Objective:  ? Physical Exam ?Eyes:  ?   General: No scleral icterus. ?Pulmonary:  ?   Effort: Pulmonary effort is normal.  ?Neurological:  ?   General: No focal deficit present.  ?   Mental Status: He is alert.  ?Psychiatric:     ?   Mood and Affect: Mood normal.  ? ?SH: no tobacco ? ? ? ?   ?Assessment & Plan:  ? ? ?

## 2022-01-21 NOTE — Addendum Note (Signed)
Addended by: Caffie Pinto on: 01/21/2022 10:33 AM ? ? Modules accepted: Orders ? ?

## 2022-01-21 NOTE — Assessment & Plan Note (Signed)
picc working well, no issues.  Home health to remove after the last dose on 5/21.   ?

## 2022-01-21 NOTE — Assessment & Plan Note (Signed)
Now removed and no further plans for a pacemaker again.  Repeat blood cultures remained negative.   ?No changes to plan for completing the antibiotic course on 5/21 then stop and observe off of antibiotics.   ?He can follow up as needed.  ?

## 2022-01-21 NOTE — Progress Notes (Signed)
Per Dr. Linus Salmons no changes to OPAT orders ?

## 2022-01-27 ENCOUNTER — Telehealth: Payer: Self-pay

## 2022-01-27 ENCOUNTER — Encounter: Payer: Medicare HMO | Admitting: Student

## 2022-01-27 NOTE — Telephone Encounter (Signed)
Received call from Lauree Chandler, with [facility], to confirm PICC orders.  Per Dr. Henreitta Leber OPAT orders and most recent office note on 01/21/22, communicated ok to pull PICC after last dose on 5/21.  RN confirmed and read back. No additional questions.  Binnie Kand, RN

## 2022-01-27 NOTE — Telephone Encounter (Signed)
Community message sent to [facility] and [facility] clinical pharmacists

## 2022-01-27 NOTE — Telephone Encounter (Signed)
Thank you Jason Jarvis.

## 2022-01-28 ENCOUNTER — Encounter: Payer: Self-pay | Admitting: Internal Medicine

## 2022-01-28 ENCOUNTER — Ambulatory Visit (INDEPENDENT_AMBULATORY_CARE_PROVIDER_SITE_OTHER): Payer: Medicare HMO | Admitting: Internal Medicine

## 2022-01-28 VITALS — BP 120/92 | HR 65 | Ht 69.0 in | Wt 193.0 lb

## 2022-01-28 DIAGNOSIS — I495 Sick sinus syndrome: Secondary | ICD-10-CM | POA: Diagnosis not present

## 2022-01-28 NOTE — Progress Notes (Signed)
HPI Jason Jarvis returns today for followup. He is a pleasant 71 yo man with a h/o sinus node dysfunction s/p PPM insertion twice. He underwent extraction by me a month ago. He has done well in the interim except for some bleeding in his pocket. He has not had syncope. He feels well. No additional bleeding since he came to our office last week. He has been off of his anti-coagulation. He has not showered. Allergies  Allergen Reactions   Ativan [Lorazepam] Other (See Comments)    Hallucinated / Confused / Combative   Keflex [Cephalexin] Hives   Ace Inhibitors Cough        Prednisone Rash   Sulfa Antibiotics Rash     Current Outpatient Medications  Medication Sig Dispense Refill   acetaminophen (TYLENOL) 500 MG tablet Take 2 tablets (1,000 mg total) by mouth every 6 (six) hours as needed. (Patient taking differently: Take 500 mg by mouth every 6 (six) hours as needed.) 100 tablet 2   amLODipine (NORVASC) 2.5 MG tablet      atorvastatin (LIPITOR) 40 MG tablet Take 40 mg by mouth daily.     carisoprodol (SOMA) 350 MG tablet Take 350 mg by mouth at bedtime as needed for muscle spasms.     doxazosin (CARDURA) 2 MG tablet Take 2 mg by mouth at bedtime.     furosemide (LASIX) 40 MG tablet Take 40 mg by mouth daily.     IFEREX 150 150 MG capsule Take 150 mg by mouth at bedtime.     Insulin Aspart Prot & Aspart (NOVOLOG MIX 70/30 [place]) Inject 20-30 Units into the skin every morning. 30 units in the morning, 20 units at bedtime     losartan (COZAAR) 100 MG tablet Take 1 tablet (100 mg total) by mouth daily.     Multiple Vitamins-Minerals (CENTRUM SILVER ADULT 50+ PO) Take 1 tablet by mouth daily.     nitroGLYCERIN (NITROSTAT) 0.4 MG SL tablet Place 0.4 mg under the tongue every 5 (five) minutes as needed for chest pain.     pantoprazole (PROTONIX) 40 MG tablet Take 40 mg by mouth 2 (two) times daily.     potassium chloride (K-DUR) 10 MEQ tablet Take 30 mEq by mouth daily.     SYMBICORT  160-4.5 MCG/ACT inhaler Inhale 2 puffs into the lungs in the morning and at bedtime.     triamcinolone cream (KENALOG) 0.1 % Apply 1 application. topically 2 (two) times daily as needed (itching).     No current facility-administered medications for this visit.     Past Medical History:  Diagnosis Date   Acute metabolic encephalopathy 3/71/6967   Arm DVT (deep venous thromboembolism), acute (HCC)    Bleeding from wound 01/12/2022   Brachial plexopathy 05/14/2015   Cellulitis of middle toe 02/25/2016   Cervical radiculopathy at C8 05/10/2015   Chronic kidney disease    Cord compression ([place]) 05/10/2015   Coronary artery disease    Deep vein thrombosis (DVT) of distal vein of left lower extremity of indeterminate age [facility]) 12/29/2021   Diabetic ulcer of toe of left foot associated with diabetes mellitus due to underlying condition, with fat layer exposed ([place]) 07/04/2021   DVT of axillary vein, acute right ([place]) 10/25/2017   Empyema of gallbladder 10/24/2017   GIB (gastrointestinal bleeding) 10/20/2017   History of kidney cancer 02/10/2019   Hypercholesterolemia 04/22/2016   Hypertension    Iron deficiency anemia due to chronic blood loss 07/29/2017  Formatting of this note might be different from the original. Added automatically from request for surgery [phone]   Metatarsalgia of left foot 08/21/2015   Metatarsalgia of right foot 07/03/2015   MRSA bacteremia 06/18/2017   MVP (mitral valve prolapse) 06/10/2013   Myocardial infarction [facility])    pt states that he had a "light heart attack about 25 yrs ago"   Pacemaker    Pacemaker pocket hematoma 01/12/2022   Pneumonia    Renal cell carcinoma of right kidney ([place]) 06/09/2019   Right renal mass 03/31/2016   S/P placement of cardiac pacemaker    Scrotal pain 12/22/2021   Severe sepsis ([place]) 12/22/2021   Snoring 08/25/2019   Stroke ([place])    approx 15 yrs ago    ROS:   All systems reviewed and negative except as noted in the HPI.   Past Surgical  History:  Procedure Laterality Date   BUBBLE STUDY  12/26/2021   Procedure: BUBBLE STUDY;  Surgeon: [name], MD;  Location: [facility];  Service: Cardiovascular;;   IR GENERIC HISTORICAL  04/22/2016   IR RADIOLOGIST EVAL & MGMT 04/22/2016 Greggory Keen, MD [facility] INTERV RAD   IR RADIOLOGIST EVAL & MGMT  05/18/2019   IR RADIOLOGIST EVAL & MGMT  07/06/2019   IR RADIOLOGIST EVAL & MGMT  10/26/2019   IR RADIOLOGIST EVAL & MGMT  10/30/2020   IR RADIOLOGIST EVAL & MGMT  11/01/2021   PACEMAKER IMPLANT  05/2017   PACEMAKER LEAD REMOVAL N/A 12/29/2021   Procedure: PACEMAKER LEAD REMOVAL;  Surgeon: Evans Lance, MD;  Location: [place];  Service: Cardiovascular;  Laterality: N/A;   PACEMAKER REMOVAL  07/2017   TEE WITHOUT CARDIOVERSION N/A 12/26/2021   Procedure: TRANSESOPHAGEAL ECHOCARDIOGRAM (TEE);  Surgeon: [name], MD;  Location: [facility] ENDOSCOPY;  Service: Cardiovascular;  Laterality: N/A;     History reviewed. No pertinent family history.   Social History   Socioeconomic History   Marital status: Married    Spouse name: Not on file   Number of children: Not on file   Years of education: Not on file   Highest education level: Not on file  Occupational History   Not on file  Tobacco Use   Smoking status: Never   Smokeless tobacco: Never  Substance and Sexual Activity   Alcohol use: No    Alcohol/week: 0.0 standard drinks   Drug use: No   Sexual activity: Not on file  Other Topics Concern   Not on file  Social History Narrative   Not on file   Social Determinants of Health   Financial Resource Strain: Not on file  Food Insecurity: Not on file  Transportation Needs: Not on file  Physical Activity: Not on file  Stress: Not on file  Social Connections: Not on file  Intimate Partner Violence: Not on file     BP (!) 120/92   Pulse 65   Ht '5\' 9"'$  (1.753 m)   Wt 193 lb (87.5 kg)   SpO2 99%   BMI 28.50 kg/m   Physical Exam:  Well appearing NAD HEENT:  Unremarkable Neck:  No JVD, no thyromegally Lymphatics:  No adenopathy Back:  No CVA tenderness Lungs:  Clear; small area of fluctuance but no drainage or erythema.  HEART:  Regular rate rhythm, no murmurs, no rubs, no clicks Abd:  soft, positive bowel sounds, no organomegally, no rebound, no guarding Ext:  2 plus pulses, no edema, no cyanosis, no clubbing Skin:  No rashes no  nodules Neuro:  CN II through XII intact, motor grossly intact   DEVICE  Normal device function.  See PaceArt for details.   Assess/Plan:  PPM infection - he is s/p infection. He appears to be doing well. I'll see him back in a month to see how the pocket is healing. Sinus node dysfunction - this is mostly while he sleeps.  PAF - He is maintaining NSR. I will ask him to hold the blood thinner for another month with plans to restart in a month.  Carleene Overlie Tiwanna Tuch,MD

## 2022-01-28 NOTE — Patient Instructions (Addendum)
Medication Instructions:  Your physician recommends that you continue on your current medications as directed. Please refer to the Current Medication list given to you today.  Do NOT restart your blood thinner until you see Dr. Lovena Le back in office.  Labwork: None ordered.  Testing/Procedures: None ordered.  Follow-Up: Your physician wants you to follow-up in: one month with [name], MD   February 24, 2022 at 11:15 am at the [facility] office  Any Other Special Instructions Will Be Listed Below (If Applicable).  If you need a refill on your cardiac medications before your next appointment, please call your pharmacy.   Important Information About Sugar

## 2022-02-10 ENCOUNTER — Encounter (HOSPITAL_COMMUNITY): Payer: Self-pay | Admitting: Emergency Medicine

## 2022-02-10 ENCOUNTER — Emergency Department (HOSPITAL_COMMUNITY)
Admission: EM | Admit: 2022-02-10 | Discharge: 2022-02-10 | Disposition: A | Discharge status: Home / Self Care | Payer: Medicare HMO | Attending: Emergency Medicine | Admitting: Emergency Medicine

## 2022-02-10 ENCOUNTER — Telehealth: Payer: Self-pay | Admitting: Internal Medicine

## 2022-02-10 DIAGNOSIS — Z4801 Encounter for change or removal of surgical wound dressing: Secondary | ICD-10-CM | POA: Diagnosis not present

## 2022-02-10 DIAGNOSIS — Z794 Long term (current) use of insulin: Secondary | ICD-10-CM | POA: Insufficient documentation

## 2022-02-10 DIAGNOSIS — R739 Hyperglycemia, unspecified: Secondary | ICD-10-CM | POA: Diagnosis not present

## 2022-02-10 DIAGNOSIS — X58XXXA Exposure to other specified factors, initial encounter: Secondary | ICD-10-CM | POA: Insufficient documentation

## 2022-02-10 DIAGNOSIS — S20219A Contusion of unspecified front wall of thorax, initial encounter: Secondary | ICD-10-CM | POA: Diagnosis not present

## 2022-02-10 DIAGNOSIS — T827XXS Infection and inflammatory reaction due to other cardiac and vascular devices, implants and grafts, sequela: Secondary | ICD-10-CM

## 2022-02-10 DIAGNOSIS — S299XXA Unspecified injury of thorax, initial encounter: Secondary | ICD-10-CM | POA: Diagnosis present

## 2022-02-10 LAB — COMPREHENSIVE METABOLIC PANEL
ALT: 21 U/L (ref 0–44)
AST: 19 U/L (ref 15–41)
Albumin: 3.5 g/dL (ref 3.5–5.0)
Alkaline Phosphatase: 95 U/L (ref 38–126)
Anion gap: 6 (ref 5–15)
BUN: 13 mg/dL (ref 8–23)
CO2: 26 mmol/L (ref 22–32)
Calcium: 9 mg/dL (ref 8.9–10.3)
Chloride: 109 mmol/L (ref 98–111)
Creatinine, Ser: 0.97 mg/dL (ref 0.61–1.24)
GFR, Estimated: >60 mL/min (ref >60)
Glucose, Bld: 141 mg/dL — ABNORMAL HIGH (ref 70–99)
Potassium: 3.8 mmol/L (ref 3.5–5.1)
Sodium: 141 mmol/L (ref 135–145)
Total Bilirubin: 0.6 mg/dL (ref 0.3–1.2)
Total Protein: 6.3 g/dL — ABNORMAL LOW (ref 6.5–8.1)

## 2022-02-10 LAB — CBC WITH DIFFERENTIAL/PLATELET
Abs Immature Granulocytes: 0.07 10*3/uL (ref 0.00–0.07)
Basophils Absolute: 0.1 10*3/uL (ref 0.0–0.1)
Basophils Relative: 1 %
Eosinophils Absolute: 0.2 10*3/uL (ref 0.0–0.5)
Eosinophils Relative: 2 %
HCT: 37.1 % — ABNORMAL LOW (ref 39.0–52.0)
Hemoglobin: 11.5 g/dL — ABNORMAL LOW (ref 13.0–17.0)
Immature Granulocytes: 1 %
Lymphocytes Relative: 21 %
Lymphs Abs: 1.8 10*3/uL (ref 0.7–4.0)
MCH: 26.6 pg (ref 26.0–34.0)
MCHC: 31 g/dL (ref 30.0–36.0)
MCV: 85.7 fL (ref 80.0–100.0)
Monocytes Absolute: 0.5 10*3/uL (ref 0.1–1.0)
Monocytes Relative: 6 %
Neutro Abs: 5.9 10*3/uL (ref 1.7–7.7)
Neutrophils Relative %: 69 %
Platelets: 197 10*3/uL (ref 150–400)
RBC: 4.33 MIL/uL (ref 4.22–5.81)
RDW: 14.8 % (ref 11.5–15.5)
WBC: 8.4 10*3/uL (ref 4.0–10.5)
nRBC: 0 % (ref 0.0–0.2)

## 2022-02-10 LAB — LACTIC ACID, PLASMA: Lactic Acid, Venous: 0.8 mmol/L (ref 0.5–1.9)

## 2022-02-10 NOTE — Discharge Instructions (Signed)
Follow up with cardiology as instructed

## 2022-02-10 NOTE — ED Triage Notes (Signed)
Patient here for evaluation of continued drainage and pain at site of pacemaker removal that was done several weeks ago. Patient is alert, oriented, ambulatory, and in no apparent distress at this time.

## 2022-02-10 NOTE — ED Provider Notes (Signed)
[facility] EMERGENCY DEPARTMENT Provider Note   CSN: [id number] Arrival date & time: 02/10/22  1308     History  Chief Complaint  Patient presents with   Wound Check    Jason Ducking Liebert Sr. is a 71 y.o. male.  Patient complains of swelling and pain at the site that he had a pacemaker removed.  Patient's wife provides history she states that patient became septic and had to have his pacemaker removed because of infection.  He reports that the patient walked his dog and that area has been swelling since.  His wife spoke to cardiology who advised him to come to the emergency department for evaluation for possible infection.  The history is provided by the patient. No language interpreter was used.  Wound Check This is a new problem. The problem has not changed since onset.Nothing aggravates the symptoms. Nothing relieves the symptoms. He has tried nothing for the symptoms.      Home Medications Prior to Admission medications   Medication Sig Start Date End Date Taking? Authorizing Provider  acetaminophen (TYLENOL) 500 MG tablet Take 2 tablets (1,000 mg total) by mouth every 6 (six) hours as needed. Patient taking differently: Take 500 mg by mouth every 6 (six) hours as needed. 01/01/22 01/01/23  Nita Sells, MD  amLODipine (NORVASC) 2.5 MG tablet  01/23/16   [provider]  atorvastatin (LIPITOR) 40 MG tablet Take 40 mg by mouth daily.    [provider]  carisoprodol (SOMA) 350 MG tablet Take 350 mg by mouth at bedtime as needed for muscle spasms.    [provider]  doxazosin (CARDURA) 2 MG tablet Take 2 mg by mouth at bedtime.    [provider]  furosemide (LASIX) 40 MG tablet Take 40 mg by mouth daily.    [provider]  IFEREX 150 150 MG capsule Take 150 mg by mouth at bedtime. 10/07/21   [provider]  Insulin Aspart Prot & Aspart (NOVOLOG MIX 70/30 [place]) Inject 20-30 Units into the skin every  morning. 30 units in the morning, 20 units at bedtime    [provider]  losartan (COZAAR) 100 MG tablet Take 1 tablet (100 mg total) by mouth daily. 01/14/22   Ezequiel Essex, MD  Multiple Vitamins-Minerals (CENTRUM SILVER ADULT 50+ PO) Take 1 tablet by mouth daily.    [provider]  nitroGLYCERIN (NITROSTAT) 0.4 MG SL tablet Place 0.4 mg under the tongue every 5 (five) minutes as needed for chest pain.    [provider]  pantoprazole (PROTONIX) 40 MG tablet Take 40 mg by mouth 2 (two) times daily. 11/30/21   [provider]  potassium chloride (K-DUR) 10 MEQ tablet Take 30 mEq by mouth daily.    [provider]  SYMBICORT 160-4.5 MCG/ACT inhaler Inhale 2 puffs into the lungs in the morning and at bedtime. 12/19/21   [provider]  triamcinolone cream (KENALOG) 0.1 % Apply 1 application. topically 2 (two) times daily as needed (itching). 12/14/21   [provider]      Allergies    Ativan [lorazepam], Keflex [cephalexin], Ace inhibitors, Prednisone, and Sulfa antibiotics    Review of Systems   Review of Systems  Constitutional:  Positive for fever.  All other systems reviewed and are negative.  Physical Exam Updated Vital Signs BP (!) 152/78 (BP Location: Left Arm)   Pulse (!) 52   Temp 98 F (36.7 C) (Oral)   Resp 13  SpO2 95%  Physical Exam Vitals and nursing note reviewed.  Constitutional:      General: He is not in acute distress.    Appearance: He is well-developed.  HENT:     Head: Normocephalic and atraumatic.  Cardiovascular:     Rate and Rhythm: Normal rate and regular rhythm.     Heart sounds: No murmur heard.    Comments: Bruising and swelling chest no erythema Pulmonary:     Effort: Pulmonary effort is normal. No respiratory distress.     Breath sounds: Normal breath sounds.  Abdominal:     Palpations: Abdomen is soft.     Tenderness: There is no abdominal tenderness.  Musculoskeletal:         General: No swelling.  Skin:    General: Skin is warm and dry.     Capillary Refill: Capillary refill takes less than 2 seconds.  Neurological:     Mental Status: He is alert.  Psychiatric:        Mood and Affect: Mood normal.    ED Results / Procedures / Treatments   Labs (all labs ordered are listed, but only abnormal results are displayed) Labs Reviewed  COMPREHENSIVE METABOLIC PANEL - Abnormal; Notable for the following components:      Result Value   Glucose, Bld 141 (*)    Total Protein 6.3 (*)    All other components within normal limits  CBC WITH DIFFERENTIAL/PLATELET - Abnormal; Notable for the following components:   Hemoglobin 11.5 (*)    HCT 37.1 (*)    All other components within normal limits  LACTIC ACID, PLASMA  LACTIC ACID, PLASMA  URINALYSIS, ROUTINE W REFLEX MICROSCOPIC    EKG None  Radiology No results found.  Procedures Procedures    Medications Ordered in ED Medications - No data to display  ED Course/ Medical Decision Making/ A&P                           Medical Decision Making Patient here for evaluation of possible surgical incision  Amount and/or Complexity of Data Reviewed Independent Historian: spouse External Data Reviewed: labs and notes.    Details: Cardiology notes reviewed Labs: ordered. Decision-making details documented in ED Course.    Details: Labs are ordered reviewed and interpreted glucose is 141 patient has a normal white blood cell count hemoglobin is 11.5 ECG/medicine tests: ordered and independent interpretation performed. Decision-making details documented in ED Course.    Details: EKG shows no acute abnormality Discussion of management or test interpretation with external provider(s): Dr. Caryl Comes saw here in the emergency department he evaluated patient and feels like he is stable for discharge  Risk Decision regarding hospitalization. Risk Details: Dr. Caryl Comes saw and evaluated patient he felt like he was stable  for discharge and he feels like patient has a small hematoma he does not feel like there is any infection           Final Clinical Impression(s) / ED Diagnoses Final diagnoses:  Hematoma of chest wall, unspecified laterality, initial encounter    Rx / DC Orders ED Discharge Orders     None      An After Visit Summary was printed and given to the patient.    Sidney Ace 02/10/22 1555    Carmin Muskrat, MD 02/13/22 2312

## 2022-02-10 NOTE — Telephone Encounter (Signed)
  1. Has your device fired? no  2. Is you device beeping? No   3. Are you experiencing draining or swelling at device site? Yes. Pain and discharge  4. Are you calling to see if we received your device transmission? no  5. Have you passed out? no  Wife of patient wanted to have the incision looked at. Patient is scheduled to see Oda Kilts 02/13/22 at 10:00 am. Wife just wanted to make sure there is nothing else she needs to do   Please route to [place]

## 2022-02-10 NOTE — Consult Note (Addendum)
Cardiology Consultation:   Patient ID: Jason Januszewski Llerenas Sr. MRN: [id number]; DOB: Sep 13, 1950  Admit date: 02/10/2022 Date of Consult: 02/10/2022  PCP:  Jeni Salles, MD   [place] Prov[facility] Cardiologist:  Dr. Gerarda Gunther ([place], [place])  of late >>Dr. Lovena Le   Patient Profile:   Jason Stankey Rothlisberger Sr. is a 71 y.o. male with a hx of of CKD w/hx of RCC w.hx of cryoablation, HTN, stroke (remote), DM, COPD,  seizure d/o (off AED)w/PPM (initial implant 2018 with hx of prior bacteremia/endocarditis and extraction >> new PPM 2019) >> recurrent bacteremia/sepsis April 2023 with E  faecalis and underwent PPM extraction 12/29/21 with no plans for re-implant who is being seen 02/10/2022 for the evaluation of wound drainage.  History of Present Illness:   Jason Jarvis post extraction had a hematoma that required pressure to be held and pressure dressing placed Planned for Eliquis once stable Eliquis was eventually started and he readmitted for bleeding from the pocket, this was noted and appeared to be old blood from his hematoma leaking, not actively bleeding, regardless, his Eliquis held He saw a device RN 01/17/22, had some oozing, hematoma was held with pressure and the hematoma expressed. He saw Dr. Lovena Le 01/28/22 with no additional bleeding and sounds like healing well.  The patient's wife called the office today noting what she thought maybe ?pus or some kind of drainage and they were referred to the ER for EP service to evaluate for possible need of pocket management.  The patient is quite hard of hearing, and his batteries are dead on his hearing aids, though can hear some, his wife does most of the talking. He denies feeling sick or febrile, though has been a long couple months, his wife hopes to get this  behind him.  Labs note normal WBC, he is afebrile Lactic acid also normal    Past Medical History:  Diagnosis Date   Acute metabolic encephalopathy 3/66/4403   Arm DVT (deep  venous thromboembolism), acute (HCC)    Bleeding from wound 01/12/2022   Brachial plexopathy 05/14/2015   Cellulitis of middle toe 02/25/2016   Cervical radiculopathy at C8 05/10/2015   Chronic kidney disease    Cord compression ([place]) 05/10/2015   Coronary artery disease    Deep vein thrombosis (DVT) of distal vein of left lower extremity of indeterminate age [facility]) 12/29/2021   Diabetic ulcer of toe of left foot associated with diabetes mellitus due to underlying condition, with fat layer exposed ([place]) 07/04/2021   DVT of axillary vein, acute right ([place]) 10/25/2017   Empyema of gallbladder 10/24/2017   GIB (gastrointestinal bleeding) 10/20/2017   History of kidney cancer 02/10/2019   Hypercholesterolemia 04/22/2016   Hypertension    Iron deficiency anemia due to chronic blood loss 07/29/2017   Formatting of this note might be different from the original. Added automatically from request for surgery [id number]   Metatarsalgia of left foot 08/21/2015   Metatarsalgia of right foot 07/03/2015   MRSA bacteremia 06/18/2017   MVP (mitral valve prolapse) 06/10/2013   Myocardial infarction [facility])    pt states that he had a "light heart attack about 25 yrs ago"   Pacemaker    Pacemaker pocket hematoma 01/12/2022   Pneumonia    Renal cell carcinoma of right kidney ([place]) 06/09/2019   Right renal mass 03/31/2016   S/P placement of cardiac pacemaker    Scrotal pain 12/22/2021   Severe sepsis ([place]) 12/22/2021   Snoring 08/25/2019   Stroke ([place])  approx 15 yrs ago    Past Surgical History:  Procedure Laterality Date   BUBBLE STUDY  12/26/2021   Procedure: BUBBLE STUDY;  Surgeon: [name], MD;  Location: [place];  Service: Cardiovascular;;   IR GENERIC HISTORICAL  04/22/2016   IR RADIOLOGIST EVAL & MGMT 04/22/2016 Greggory Keen, MD [facility] INTERV RAD   IR RADIOLOGIST EVAL & MGMT  05/18/2019   IR RADIOLOGIST EVAL & MGMT  07/06/2019   IR RADIOLOGIST EVAL & MGMT  10/26/2019   IR RADIOLOGIST EVAL & MGMT   10/30/2020   IR RADIOLOGIST EVAL & MGMT  11/01/2021   PACEMAKER IMPLANT  05/2017   PACEMAKER LEAD REMOVAL N/A 12/29/2021   Procedure: PACEMAKER LEAD REMOVAL;  Surgeon: Evans Lance, MD;  Location: [place];  Service: Cardiovascular;  Laterality: N/A;   PACEMAKER REMOVAL  07/2017   TEE WITHOUT CARDIOVERSION N/A 12/26/2021   Procedure: TRANSESOPHAGEAL ECHOCARDIOGRAM (TEE);  Surgeon: [name], MD;  Location: [facility] ENDOSCOPY;  Service: Cardiovascular;  Laterality: N/A;     Home Medications:  Prior to Admission medications   Medication Sig Start Date End Date Taking? Authorizing Provider  acetaminophen (TYLENOL) 500 MG tablet Take 2 tablets (1,000 mg total) by mouth every 6 (six) hours as needed. Patient taking differently: Take 500 mg by mouth every 6 (six) hours as needed. 01/01/22 01/01/23  Nita Sells, MD  amLODipine (NORVASC) 2.5 MG tablet  01/23/16   [provider]  atorvastatin (LIPITOR) 40 MG tablet Take 40 mg by mouth daily.    [provider]  carisoprodol (SOMA) 350 MG tablet Take 350 mg by mouth at bedtime as needed for muscle spasms.    [provider]  doxazosin (CARDURA) 2 MG tablet Take 2 mg by mouth at bedtime.    [provider]  furosemide (LASIX) 40 MG tablet Take 40 mg by mouth daily.    [provider]  IFEREX 150 150 MG capsule Take 150 mg by mouth at bedtime. 10/07/21   [provider]  Insulin Aspart Prot & Aspart (NOVOLOG MIX 70/30 [place]) Inject 20-30 Units into the skin every morning. 30 units in the morning, 20 units at bedtime    [provider]  losartan (COZAAR) 100 MG tablet Take 1 tablet (100 mg total) by mouth daily. 01/14/22   Ezequiel Essex, MD  Multiple Vitamins-Minerals (CENTRUM SILVER ADULT 50+ PO) Take 1 tablet by mouth daily.    [provider]  nitroGLYCERIN (NITROSTAT) 0.4 MG SL tablet Place 0.4 mg under the tongue every 5 (five) minutes as needed for chest pain.    [provider]  pantoprazole (PROTONIX) 40 MG tablet Take 40 mg by mouth 2 (two) times daily. 11/30/21   [provider]  potassium chloride (K-DUR) 10 MEQ tablet Take 30 mEq by mouth daily.    [provider]  SYMBICORT 160-4.5 MCG/ACT inhaler Inhale 2 puffs into the lungs in the morning and at bedtime. 12/19/21   [provider]  triamcinolone cream (KENALOG) 0.1 % Apply 1 application. topically 2 (two) times daily as needed (itching). 12/14/21   [provider]    Inpatient Medications: Scheduled Meds:  Continuous Infusions:  PRN Meds:   Allergies:    Allergies  Allergen Reactions   Ativan [Lorazepam] Other (See Comments)    Hallucinated / Confused / Combative   Keflex [Cephalexin] Hives   Ace Inhibitors Cough        Prednisone Rash   Sulfa Antibiotics Rash  Social History:   Social History   Socioeconomic History   Marital status: Married    Spouse name: Not on file   Number of children: Not on file   Years of education: Not on file   Highest education level: Not on file  Occupational History   Not on file  Tobacco Use   Smoking status: Never   Smokeless tobacco: Never  Substance and Sexual Activity   Alcohol use: No    Alcohol/week: 0.0 standard drinks   Drug use: No   Sexual activity: Not on file  Other Topics Concern   Not on file  Social History Narrative   Not on file   Social Determinants of Health   Financial Resource Strain: Not on file  Food Insecurity: Not on file  Transportation Needs: Not on file  Physical Activity: Not on file  Stress: Not on file  Social Connections: Not on file  Intimate Partner Violence: Not on file    Family History:   No contributing or petinent family hx  ROS:  Please see the history of present illness.  All other ROS reviewed and negative.     Physical Exam/Data:   Vitals:   02/10/22 1318  BP: (!) 152/78  Pulse: (!) 52  Resp: 13  Temp: 98 F (36.7 C)  TempSrc: Oral   SpO2: 95%   No intake or output data in the 24 hours ending 02/10/22 1800    01/28/2022    2:40 PM 01/21/2022    8:50 AM 01/01/2022    5:03 AM  Last 3 Weights  Weight (lbs) 193 lb 192 lb 194 lb 0.1 oz  Weight (kg) 87.544 kg 87.091 kg 88 kg     There is no height or weight on file to calculate BMI.  General:  Well nourished, well developed, in no acute distress HEENT: normal Neck: no JVD Vascular: No carotid bruits; Distal pulses 2+ bilaterally Cardiac:  RRR; no murmurs, gallops or rubs Lungs:  CTA b/l, no wheezing, rhonchi or rales  Abd: soft, nontender Ext: no edema Musculoskeletal:  No deformities Skin: warm and dry  Neuro: hard of hearing otherwise no focal abnormalities noted Psych:  Normal affect   L chest, extraction site: pocket has fluid collection, mild old ecchymosis, no erythema, not warm. Initial exam notes healed wound edges, though Dr. Caryl Comes with more vigorous evaluation and manipulation a very small (68m) open area at the medial point of the incision was noted and with marked pressure a small amount of serosanguinous fluid noted  EKG:  The EKG was personally reviewed and demonstrates:  not done Telemetry:  Telemetry was personally reviewed and demonstrates:  not on telemetry  Relevant CV Studies:  12/26/2021: TEE  1. Dual chamber pacemaker seen. Possible tiny vegetation on RA lead. At  the distal aspect of the RV lead, there is an independently mobile,  heterogeneous echo density that may be partially calcified. 1 x 0.7 cm. In  the setting of bacteremia, this is  most consistent with pacemaker lead vegetation. Right ventricular systolic  function is normal. The right ventricular size is normal.   2. Left ventricular ejection fraction, by estimation, is 60 to 65%. The  left ventricle has normal function.   3. No left atrial/left atrial appendage thrombus was detected. The LAA  emptying velocity was 98 cm/s.   4. A small pericardial effusion is present.   5.  The mitral valve is grossly normal. Trivial mitral valve  regurgitation.   6. The  aortic valve is tricuspid. Aortic valve regurgitation is trivial.   7. Aortic dilatation noted. There is borderline dilatation of the aortic  root, measuring 38 mm. There is borderline dilatation of the ascending  aorta, measuring 39 mm.   8. Agitated saline contrast bubble study was negative, with no evidence  of any interatrial shunt.      09/28/2019: [facility] CONCLUSIONS:  1. Nonobstructive coronary artery plaque  2. Preserved left ventricular ejection fraction  3. Nonischemic chest discomfort  4. Dyspnea likely multifactorial and not secondary to obstructive coronary  disease   RECOMMENDATIONS:  1. Guideline directed medical management of hypertension, nonobstructive  coronary artery disease, cardiac risk factors  2. Follow-up with Drs. Wahid and [employer]    Laboratory Data:  High Sensitivity Troponin:  No results for input(s): TROPONINIHS in the last 720 hours.   Chemistry Recent Labs  Lab 02/10/22 1427  NA 141  K 3.8  CL 109  CO2 26  GLUCOSE 141*  BUN 13  CREATININE 0.97  CALCIUM 9.0  GFRNONAA >60  ANIONGAP 6    Recent Labs  Lab 02/10/22 1427  PROT 6.3*  ALBUMIN 3.5  AST 19  ALT 21  ALKPHOS 95  BILITOT 0.6   Lipids No results for input(s): CHOL, TRIG, HDL, LABVLDL, LDLCALC, CHOLHDL in the last 168 hours.  Hematology Recent Labs  Lab 02/10/22 1427  WBC 8.4  RBC 4.33  HGB 11.5*  HCT 37.1*  MCV 85.7  MCH 26.6  MCHC 31.0  RDW 14.8  PLT 197   Thyroid No results for input(s): TSH, FREET4 in the last 168 hours.  BNPNo results for input(s): BNP, PROBNP in the last 168 hours.  DDimer No results for input(s): DDIMER in the last 168 hours.   Radiology/Studies:  No results found.   Assessment and Plan:   Persistent pocket re-accumulation and drainage Does not appear infected Very slight  serosanguinous fluid noted with significant pressure/pocket manipulation No skin  changes No leukocytosis No fever Completed his course of home IV antibiotics  Dr. Caryl Comes place a small amount of packing in the space and covered with a bandage to try and allow drainage of the fluid. D/w the patient's wife and patient that we will expect/look for some drainage Follow up next week is in place with Dr. Lovena Le   Risk Assessment/Risk Scores:     For questions or updates, please contact [place] Please consult [email or web address] for contact info under    Signed, Baldwin Jamaica, PA-C  02/10/2022 6:00 PM   As noted there was a sinus drainage from the extracted pocket, with no evidence of local or systemic infection  I places a small amount of packing and we will have to see if it heals,  it may require resection and will have patient followup with dr Elliot Cousin

## 2022-02-10 NOTE — ED Provider Triage Note (Signed)
Emergency Medicine Provider Triage Evaluation Note  Jason Broce Lipsitz Sr. , a 71 y.o. male  was evaluated in triage.  Pt complains of swelling left chest.  Pt had pacemaker removed due to infection  Review of Systems  Positive: chills Negative: fever  Physical Exam  BP (!) 152/78 (BP Location: Left Arm)   Pulse (!) 52   Temp 98 F (36.7 C) (Oral)   Resp 13   SpO2 95%  Gen:   Awake, no distress   Resp:  Normal effort  MSK:   Moves extremities without difficulty  Other:    Medical Decision Making  Medically screening exam initiated at 2:34 PM.  Appropriate orders placed.  Jason Portner Dobbins Sr. was informed that the remainder of the evaluation will be completed by another provider, this initial triage assessment does not replace that evaluation, and the importance of remaining in the ED until their evaluation is complete.     Fransico Meadow, [place] 02/10/22 1435

## 2022-02-10 NOTE — Progress Notes (Signed)
Patient referred t the ER via the office with reports of ongoing/new drainage from his extraction site. Pt without symptoms of illness or noted fever. Afebrile here in the ER. Initially wound appears intact, though with more manipulation and pressure to the pocket a very small perhaps millimeter on the medial edge did open, with strong pressure on the pocket only slight serosanguinous fluid noted. No erythema, there is pocket fluid, is soft.  Dr. Caryl Comes place a very small amount of packing to the site to alow drainage of the pocket. He did not think admission or antibiotics were indication, nor did he think pocket evacuatin was needed.  Will plan to see him in a week in the office.  Full consult note to follow OK to discharge to home from the ER  Tommye Standard, PA-C

## 2022-02-10 NOTE — Telephone Encounter (Signed)
Spoke with patients wife advised her that Jason Jarvis had spoken with Dr. Quentin Ore and that Dr. Quentin Ore advised that patient come to the emergency room to be evaluated as he might need IV antibiotics patients wife voiced understanding and stated that she would bring patient to [facility] emergency room

## 2022-02-11 ENCOUNTER — Ambulatory Visit: Payer: Medicare HMO | Admitting: Student

## 2022-02-13 ENCOUNTER — Ambulatory Visit: Payer: Medicare HMO | Admitting: Student

## 2022-02-19 ENCOUNTER — Encounter: Payer: Self-pay | Admitting: Internal Medicine

## 2022-02-19 ENCOUNTER — Ambulatory Visit (INDEPENDENT_AMBULATORY_CARE_PROVIDER_SITE_OTHER): Payer: Medicare HMO | Admitting: Internal Medicine

## 2022-02-19 VITALS — BP 150/80 | HR 55 | Ht 69.0 in | Wt 203.2 lb

## 2022-02-19 DIAGNOSIS — I495 Sick sinus syndrome: Secondary | ICD-10-CM

## 2022-02-19 DIAGNOSIS — I480 Paroxysmal atrial fibrillation: Secondary | ICD-10-CM

## 2022-02-19 MED ORDER — APIXABAN 5 MG PO TABS: 5.0000 mg | ORAL_TABLET | Freq: Two times a day (BID) | ORAL | 11 refills | Status: DC

## 2022-02-19 NOTE — Progress Notes (Signed)
HPI [name] returns today for followup. He is a pleasant 71 yo man with a h/o PM infection s/p extraction. He has done well in the interim but did have some drainage. He denies fever/chills/or other infectious symptoms. He was treated with IV anti-biotics for 4 weeks. We initially held his eliquis due to pocket hematoma.  Allergies  Allergen Reactions   Ativan [Lorazepam] Other (See Comments)    Hallucinated / Confused / Combative   Keflex [Cephalexin] Hives   Ace Inhibitors Cough        Prednisone Rash   Sulfa Antibiotics Rash     Current Outpatient Medications  Medication Sig Dispense Refill   acetaminophen (TYLENOL) 500 MG tablet Take 2 tablets (1,000 mg total) by mouth every 6 (six) hours as needed. (Patient taking differently: Take 500 mg by mouth every 6 (six) hours as needed.) 100 tablet 2   amLODipine (NORVASC) 2.5 MG tablet      atorvastatin (LIPITOR) 40 MG tablet Take 40 mg by mouth daily.     carisoprodol (SOMA) 350 MG tablet Take 350 mg by mouth at bedtime as needed for muscle spasms.     doxazosin (CARDURA) 2 MG tablet Take 2 mg by mouth at bedtime.     furosemide (LASIX) 40 MG tablet Take 40 mg by mouth daily.     IFEREX 150 150 MG capsule Take 150 mg by mouth at bedtime.     Insulin Aspart Prot & Aspart (NOVOLOG MIX 70/30 [place]) Inject 20-30 Units into the skin every morning. 30 units in the morning, 20 units at bedtime     losartan (COZAAR) 100 MG tablet Take 1 tablet (100 mg total) by mouth daily.     Multiple Vitamins-Minerals (CENTRUM SILVER ADULT 50+ PO) Take 1 tablet by mouth daily.     nitroGLYCERIN (NITROSTAT) 0.4 MG SL tablet Place 0.4 mg under the tongue every 5 (five) minutes as needed for chest pain.     pantoprazole (PROTONIX) 40 MG tablet Take 40 mg by mouth 2 (two) times daily.     potassium chloride (K-DUR) 10 MEQ tablet Take 30 mEq by mouth daily.     SYMBICORT 160-4.5 MCG/ACT inhaler Inhale 2 puffs into the lungs in the morning and at bedtime.      triamcinolone cream (KENALOG) 0.1 % Apply 1 application. topically 2 (two) times daily as needed (itching).     No current facility-administered medications for this visit.     Past Medical History:  Diagnosis Date   Acute metabolic encephalopathy 5/64/3329   Arm DVT (deep venous thromboembolism), acute (HCC)    Bleeding from wound 01/12/2022   Brachial plexopathy 05/14/2015   Cellulitis of middle toe 02/25/2016   Cervical radiculopathy at C8 05/10/2015   Chronic kidney disease    Cord compression ([place]) 05/10/2015   Coronary artery disease    Deep vein thrombosis (DVT) of distal vein of left lower extremity of indeterminate age [facility]) 12/29/2021   Diabetic ulcer of toe of left foot associated with diabetes mellitus due to underlying condition, with fat layer exposed ([place]) 07/04/2021   DVT of axillary vein, acute right ([place]) 10/25/2017   Empyema of gallbladder 10/24/2017   GIB (gastrointestinal bleeding) 10/20/2017   History of kidney cancer 02/10/2019   Hypercholesterolemia 04/22/2016   Hypertension    Iron deficiency anemia due to chronic blood loss 07/29/2017   Formatting of this note might be different from the original. Added automatically from request for surgery [phone]  Metatarsalgia of left foot 08/21/2015   Metatarsalgia of right foot 07/03/2015   MRSA bacteremia 06/18/2017   MVP (mitral valve prolapse) 06/10/2013   Myocardial infarction [facility])    pt states that he had a "light heart attack about 25 yrs ago"   Pacemaker    Pacemaker pocket hematoma 01/12/2022   Pneumonia    Renal cell carcinoma of right kidney ([place]) 06/09/2019   Right renal mass 03/31/2016   S/P placement of cardiac pacemaker    Scrotal pain 12/22/2021   Severe sepsis ([place]) 12/22/2021   Snoring 08/25/2019   Stroke ([place])    approx 15 yrs ago    ROS:   All systems reviewed and negative except as noted in the HPI.   Past Surgical History:  Procedure Laterality Date   BUBBLE STUDY  12/26/2021   Procedure: BUBBLE  STUDY;  Surgeon: [name], MD;  Location: [place];  Service: Cardiovascular;;   IR GENERIC HISTORICAL  04/22/2016   IR RADIOLOGIST EVAL & MGMT 04/22/2016 Greggory Keen, MD [facility] INTERV RAD   IR RADIOLOGIST EVAL & MGMT  05/18/2019   IR RADIOLOGIST EVAL & MGMT  07/06/2019   IR RADIOLOGIST EVAL & MGMT  10/26/2019   IR RADIOLOGIST EVAL & MGMT  10/30/2020   IR RADIOLOGIST EVAL & MGMT  11/01/2021   PACEMAKER IMPLANT  05/2017   PACEMAKER LEAD REMOVAL N/A 12/29/2021   Procedure: PACEMAKER LEAD REMOVAL;  Surgeon: Evans Lance, MD;  Location: [place];  Service: Cardiovascular;  Laterality: N/A;   PACEMAKER REMOVAL  07/2017   TEE WITHOUT CARDIOVERSION N/A 12/26/2021   Procedure: TRANSESOPHAGEAL ECHOCARDIOGRAM (TEE);  Surgeon: [name], MD;  Location: [facility] ENDOSCOPY;  Service: Cardiovascular;  Laterality: N/A;     History reviewed. No pertinent family history.   Social History   Socioeconomic History   Marital status: Married    Spouse name: Not on file   Number of children: Not on file   Years of education: Not on file   Highest education level: Not on file  Occupational History   Not on file  Tobacco Use   Smoking status: Never   Smokeless tobacco: Never  Substance and Sexual Activity   Alcohol use: No    Alcohol/week: 0.0 standard drinks of alcohol   Drug use: No   Sexual activity: Not on file  Other Topics Concern   Not on file  Social History Narrative   Not on file   Social Determinants of Health   Financial Resource Strain: Not on file  Food Insecurity: Not on file  Transportation Needs: Not on file  Physical Activity: Not on file  Stress: Not on file  Social Connections: Not on file  Intimate Partner Violence: Not on file     BP (!) 150/80   Pulse (!) 55   Ht '5\' 9"'$  (1.753 m)   Wt 203 lb 3.2 oz (92.2 kg)   SpO2 96%   BMI 30.01 kg/m   Physical Exam:  Well appearing 71 yo man, NAD HEENT: Unremarkable Neck:  No JVD, no  thyromegally Lymphatics:  No adenopathy Back:  No CVA tenderness Lungs:  Clear with no wheezes; small hematoma and 3 mm stitch abscess with minimal bleeding. HEART:  Regular rate rhythm, no murmurs, no rubs, no clicks Abd:  soft, positive bowel sounds, no organomegally, no rebound, no guarding Ext:  2 plus pulses, no edema, no cyanosis, no clubbing Skin:  No rashes no nodules Neuro:  CN II through XII intact,  motor grossly intact   Assess/Plan:  Sinus node dysfunction - he is s/p extraction of his PPM and has not had symptoms. I do not plan additional PM incision. PAF - he will restart eliquis. His rates are controlled. HTN - his sbp is up today but he did not take his meds.  4. PM pocket infection - he appears to be improved. I cautioned that he could still have some pocket infection and if he starts to drain, we will need to plan more debridement.  Carleene Overlie Beth Spackman,MD

## 2022-02-19 NOTE — Patient Instructions (Signed)
Medication Instructions:  Your physician has recommended you make the following change in your medication:  Start taking Eliquis 5 mg - Take one tablet by mouth twice day.   Labwork: None ordered.  Testing/Procedures: None ordered.  Follow-Up: Your physician wants you to follow-up in: August with [name], MD   Any Other Special Instructions Will Be Listed Below (If Applicable).  If you need a refill on your cardiac medications before your next appointment, please call your pharmacy.   Wound care:    You will wash your wound site with hot soapy water twice a day.  Leave the site open to air, during the day.   At night, put a band-aid over the site.   If you notice any drainage, redness, or have a fever, call the office. . ASAP.

## 2022-02-24 ENCOUNTER — Ambulatory Visit: Payer: Medicare HMO | Admitting: Internal Medicine

## 2022-03-04 ENCOUNTER — Emergency Department (HOSPITAL_BASED_OUTPATIENT_CLINIC_OR_DEPARTMENT_OTHER)
Admission: EM | Admit: 2022-03-04 | Discharge: 2022-03-04 | Disposition: A | Discharge status: Home / Self Care | Payer: Medicare HMO | Attending: Emergency Medicine | Admitting: Emergency Medicine

## 2022-03-04 ENCOUNTER — Emergency Department (HOSPITAL_BASED_OUTPATIENT_CLINIC_OR_DEPARTMENT_OTHER): Payer: Medicare HMO

## 2022-03-04 ENCOUNTER — Encounter (HOSPITAL_BASED_OUTPATIENT_CLINIC_OR_DEPARTMENT_OTHER): Payer: Self-pay

## 2022-03-04 DIAGNOSIS — Z86718 Personal history of other venous thrombosis and embolism: Secondary | ICD-10-CM | POA: Insufficient documentation

## 2022-03-04 DIAGNOSIS — Z8673 Personal history of transient ischemic attack (TIA), and cerebral infarction without residual deficits: Secondary | ICD-10-CM | POA: Insufficient documentation

## 2022-03-04 DIAGNOSIS — J449 Chronic obstructive pulmonary disease, unspecified: Secondary | ICD-10-CM | POA: Diagnosis not present

## 2022-03-04 DIAGNOSIS — E1122 Type 2 diabetes mellitus with diabetic chronic kidney disease: Secondary | ICD-10-CM | POA: Diagnosis not present

## 2022-03-04 DIAGNOSIS — Z85528 Personal history of other malignant neoplasm of kidney: Secondary | ICD-10-CM | POA: Diagnosis not present

## 2022-03-04 DIAGNOSIS — Z950 Presence of cardiac pacemaker: Secondary | ICD-10-CM | POA: Insufficient documentation

## 2022-03-04 DIAGNOSIS — N5089 Other specified disorders of the male genital organs: Secondary | ICD-10-CM | POA: Diagnosis not present

## 2022-03-04 DIAGNOSIS — Z7901 Long term (current) use of anticoagulants: Secondary | ICD-10-CM | POA: Diagnosis not present

## 2022-03-04 DIAGNOSIS — M545 Low back pain, unspecified: Secondary | ICD-10-CM | POA: Insufficient documentation

## 2022-03-04 DIAGNOSIS — N50811 Right testicular pain: Secondary | ICD-10-CM | POA: Diagnosis present

## 2022-03-04 DIAGNOSIS — N189 Chronic kidney disease, unspecified: Secondary | ICD-10-CM | POA: Insufficient documentation

## 2022-03-04 DIAGNOSIS — I129 Hypertensive chronic kidney disease with stage 1 through stage 4 chronic kidney disease, or unspecified chronic kidney disease: Secondary | ICD-10-CM | POA: Insufficient documentation

## 2022-03-04 LAB — URINALYSIS, ROUTINE W REFLEX MICROSCOPIC
Bilirubin Urine: NEGATIVE
Glucose, UA: NEGATIVE mg/dL
Hgb urine dipstick: NEGATIVE
Ketones, ur: NEGATIVE mg/dL
Leukocytes,Ua: NEGATIVE
Nitrite: NEGATIVE
Protein, ur: NEGATIVE mg/dL
Specific Gravity, Urine: 1.025 (ref 1.005–1.030)
pH: 5.5 (ref 5.0–8.0)

## 2022-03-04 LAB — CBC WITH DIFFERENTIAL/PLATELET
Abs Immature Granulocytes: 0.04 10*3/uL (ref 0.00–0.07)
Basophils Absolute: 0 10*3/uL (ref 0.0–0.1)
Basophils Relative: 1 %
Eosinophils Absolute: 0.1 10*3/uL (ref 0.0–0.5)
Eosinophils Relative: 2 %
HCT: 33.2 % — ABNORMAL LOW (ref 39.0–52.0)
Hemoglobin: 10.8 g/dL — ABNORMAL LOW (ref 13.0–17.0)
Immature Granulocytes: 1 %
Lymphocytes Relative: 24 %
Lymphs Abs: 1.4 10*3/uL (ref 0.7–4.0)
MCH: 27.1 pg (ref 26.0–34.0)
MCHC: 32.5 g/dL (ref 30.0–36.0)
MCV: 83.4 fL (ref 80.0–100.0)
Monocytes Absolute: 0.5 10*3/uL (ref 0.1–1.0)
Monocytes Relative: 9 %
Neutro Abs: 3.8 10*3/uL (ref 1.7–7.7)
Neutrophils Relative %: 63 %
Platelets: 164 10*3/uL (ref 150–400)
RBC: 3.98 MIL/uL — ABNORMAL LOW (ref 4.22–5.81)
RDW: 15.3 % (ref 11.5–15.5)
WBC: 5.9 10*3/uL (ref 4.0–10.5)
nRBC: 0 % (ref 0.0–0.2)

## 2022-03-04 LAB — COMPREHENSIVE METABOLIC PANEL
ALT: 15 U/L (ref 0–44)
AST: 17 U/L (ref 15–41)
Albumin: 3.3 g/dL — ABNORMAL LOW (ref 3.5–5.0)
Alkaline Phosphatase: 89 U/L (ref 38–126)
Anion gap: 6 (ref 5–15)
BUN: 15 mg/dL (ref 8–23)
CO2: 22 mmol/L (ref 22–32)
Calcium: 8.7 mg/dL — ABNORMAL LOW (ref 8.9–10.3)
Chloride: 109 mmol/L (ref 98–111)
Creatinine, Ser: 1.07 mg/dL (ref 0.61–1.24)
GFR, Estimated: >60 mL/min (ref >60)
Glucose, Bld: 145 mg/dL — ABNORMAL HIGH (ref 70–99)
Potassium: 3.6 mmol/L (ref 3.5–5.1)
Sodium: 137 mmol/L (ref 135–145)
Total Bilirubin: 0.5 mg/dL (ref 0.3–1.2)
Total Protein: 6 g/dL — ABNORMAL LOW (ref 6.5–8.1)

## 2022-03-04 MED ORDER — OXYCODONE HCL 5 MG PO TABS: 10.0000 mg | ORAL_TABLET | Freq: Three times a day (TID) | ORAL | 0 refills | Status: DC | PRN

## 2022-03-04 MED ORDER — OXYCODONE-ACETAMINOPHEN 5-325 MG PO TABS
2.0000 | ORAL_TABLET | Freq: Once | ORAL | Status: AC
  Administered 2022-03-04: 2 via ORAL
  Filled 2022-03-04: qty 2

## 2022-03-07 ENCOUNTER — Emergency Department (HOSPITAL_COMMUNITY)
Admission: EM | Admit: 2022-03-07 | Discharge: 2022-03-08 | Disposition: A | Discharge status: Home / Self Care | Payer: Medicare HMO | Attending: Emergency Medicine | Admitting: Emergency Medicine

## 2022-03-07 ENCOUNTER — Emergency Department (HOSPITAL_COMMUNITY): Payer: Medicare HMO

## 2022-03-07 ENCOUNTER — Encounter (HOSPITAL_COMMUNITY): Payer: Self-pay

## 2022-03-07 DIAGNOSIS — M79604 Pain in right leg: Secondary | ICD-10-CM | POA: Insufficient documentation

## 2022-03-07 DIAGNOSIS — E876 Hypokalemia: Secondary | ICD-10-CM | POA: Diagnosis not present

## 2022-03-07 DIAGNOSIS — M5440 Lumbago with sciatica, unspecified side: Secondary | ICD-10-CM

## 2022-03-07 DIAGNOSIS — R1031 Right lower quadrant pain: Secondary | ICD-10-CM | POA: Insufficient documentation

## 2022-03-07 DIAGNOSIS — M545 Low back pain, unspecified: Secondary | ICD-10-CM | POA: Diagnosis present

## 2022-03-07 DIAGNOSIS — M5126 Other intervertebral disc displacement, lumbar region: Secondary | ICD-10-CM

## 2022-03-07 DIAGNOSIS — N433 Hydrocele, unspecified: Secondary | ICD-10-CM | POA: Insufficient documentation

## 2022-03-07 DIAGNOSIS — Z7901 Long term (current) use of anticoagulants: Secondary | ICD-10-CM | POA: Diagnosis not present

## 2022-03-07 DIAGNOSIS — Z794 Long term (current) use of insulin: Secondary | ICD-10-CM | POA: Diagnosis not present

## 2022-03-07 DIAGNOSIS — D649 Anemia, unspecified: Secondary | ICD-10-CM | POA: Insufficient documentation

## 2022-03-07 DIAGNOSIS — Z79899 Other long term (current) drug therapy: Secondary | ICD-10-CM | POA: Insufficient documentation

## 2022-03-07 LAB — URINALYSIS, ROUTINE W REFLEX MICROSCOPIC
Bilirubin Urine: NEGATIVE
Glucose, UA: 50 mg/dL — AB
Hgb urine dipstick: NEGATIVE
Ketones, ur: NEGATIVE mg/dL
Leukocytes,Ua: NEGATIVE
Nitrite: NEGATIVE
Protein, ur: NEGATIVE mg/dL
Specific Gravity, Urine: 1.023 (ref 1.005–1.030)
pH: 5 (ref 5.0–8.0)

## 2022-03-07 LAB — COMPREHENSIVE METABOLIC PANEL
ALT: 15 U/L (ref 0–44)
AST: 18 U/L (ref 15–41)
Albumin: 3.5 g/dL (ref 3.5–5.0)
Alkaline Phosphatase: 91 U/L (ref 38–126)
Anion gap: 8 (ref 5–15)
BUN: 14 mg/dL (ref 8–23)
CO2: 25 mmol/L (ref 22–32)
Calcium: 8.8 mg/dL — ABNORMAL LOW (ref 8.9–10.3)
Chloride: 107 mmol/L (ref 98–111)
Creatinine, Ser: 1.19 mg/dL (ref 0.61–1.24)
GFR, Estimated: >60 mL/min (ref >60)
Glucose, Bld: 216 mg/dL — ABNORMAL HIGH (ref 70–99)
Potassium: 3.3 mmol/L — ABNORMAL LOW (ref 3.5–5.1)
Sodium: 140 mmol/L (ref 135–145)
Total Bilirubin: 0.6 mg/dL (ref 0.3–1.2)
Total Protein: 6 g/dL — ABNORMAL LOW (ref 6.5–8.1)

## 2022-03-07 LAB — CBC
HCT: 37 % — ABNORMAL LOW (ref 39.0–52.0)
Hemoglobin: 11.7 g/dL — ABNORMAL LOW (ref 13.0–17.0)
MCH: 27.3 pg (ref 26.0–34.0)
MCHC: 31.6 g/dL (ref 30.0–36.0)
MCV: 86.2 fL (ref 80.0–100.0)
Platelets: 179 10*3/uL (ref 150–400)
RBC: 4.29 MIL/uL (ref 4.22–5.81)
RDW: 15.5 % (ref 11.5–15.5)
WBC: 6.1 10*3/uL (ref 4.0–10.5)
nRBC: 0 % (ref 0.0–0.2)

## 2022-03-07 MED ORDER — HYDROMORPHONE HCL 1 MG/ML IJ SOLN
1.0000 mg | Freq: Once | INTRAMUSCULAR | Status: AC
  Administered 2022-03-07: 1 mg via INTRAVENOUS
  Filled 2022-03-07: qty 1

## 2022-03-07 MED ORDER — FENTANYL CITRATE PF 50 MCG/ML IJ SOSY
50.0000 ug | PREFILLED_SYRINGE | Freq: Once | INTRAMUSCULAR | Status: AC
  Administered 2022-03-07: 50 ug via INTRAVENOUS
  Filled 2022-03-07: qty 1

## 2022-03-07 MED ORDER — ONDANSETRON HCL 4 MG/2ML IJ SOLN
4.0000 mg | Freq: Once | INTRAMUSCULAR | Status: AC
  Administered 2022-03-07: 4 mg via INTRAVENOUS
  Filled 2022-03-07: qty 2

## 2022-03-07 MED ORDER — IOHEXOL 300 MG/ML  SOLN
100.0000 mL | Freq: Once | INTRAMUSCULAR | Status: AC | PRN
  Administered 2022-03-07: 100 mL via INTRAVENOUS

## 2022-03-07 MED ORDER — LORAZEPAM 2 MG/ML IJ SOLN
0.5000 mg | Freq: Once | INTRAMUSCULAR | Status: DC
  Filled 2022-03-07: qty 1

## 2022-03-07 NOTE — ED Notes (Signed)
Ordering provider notified that MRI called and reported pt has pacemaker, disqualifying him from being able to receive MRI at this time.

## 2022-03-07 NOTE — ED Provider Notes (Signed)
[facility] EMERGENCY DEPARTMENT Provider Note   CSN: [id number] Arrival date & time: 03/07/22  1832     History  Chief Complaint  Patient presents with   Testicle Pain   Leg Pain    Jason Zwart Holness Sr. is a 71 y.o. 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  Presents to the emergency department with a chief complaint of testicular pain and right sided back/abdomen/leg pain.  Patient reports that he has been dealing with this pain over the last 10 weeks.  Pain has gotten worse over the last several days.  Patient reports that pain is located to his right lumbar back, wraps around to his right lower abdomen and then radiates into his right leg.  Patient also has pain to his testicles.  Pain is primarily worse with any movement or ambulation.  Patient has been taking prescribed tramadol and Percocet with no improvement in his symptoms.  Patient denies any recent falls or injuries.  Patient denies any illicit drug or tobacco use.  Denies any fever, chills, numbness, weakness, saddle anesthesia, bowel/bladder dysfunction, swelling to genitals, dysuria, hematuria, urinary urgency, urinary frequency, penile discharge.   Testicle Pain Associated symptoms include abdominal pain. Pertinent negatives include no chest pain, no headaches and no shortness of breath.  Leg Pain Associated symptoms: back pain   Associated symptoms: no fever and no neck pain        Home Medications Prior to Admission medications   Medication Sig Start Date End Date Taking? Authorizing Provider  acetaminophen (TYLENOL) 500 MG tablet Take 2 tablets (1,000 mg total) by mouth every 6 (six) hours as needed. Patient taking differently: Take 500 mg by mouth every 6 (six) hours as needed. 01/01/22 01/01/23  Nita Sells, MD   amLODipine (NORVASC) 2.5 MG tablet  01/23/16   [provider]  apixaban (ELIQUIS) 5 MG TABS tablet Take 1 tablet (5 mg total) by mouth 2 (two) times daily. 02/19/22   Evans Lance, MD  atorvastatin (LIPITOR) 40 MG tablet Take 40 mg by mouth daily.    [provider]  carisoprodol (SOMA) 350 MG tablet Take 350 mg by mouth at bedtime as needed for muscle spasms.    [provider]  doxazosin (CARDURA) 2 MG tablet Take 2 mg by mouth at bedtime.    [provider]  furosemide (LASIX) 40 MG tablet Take 40 mg by mouth daily.    [provider]  IFEREX 150 150 MG capsule Take 150 mg by mouth at bedtime. 10/07/21   [provider]  Insulin Aspart Prot & Aspart (NOVOLOG MIX 70/30 [place]) Inject 20-30 Units into the skin every morning. 30 units in the morning, 20 units at bedtime    [provider]  losartan (COZAAR) 100 MG tablet Take 1 tablet (100 mg total) by mouth daily. 01/14/22   Ezequiel Essex, MD  Multiple Vitamins-Minerals (CENTRUM SILVER ADULT 50+ PO) Take 1 tablet by mouth daily.    [provider]  nitroGLYCERIN (NITROSTAT) 0.4 MG SL tablet Place 0.4 mg under the tongue every 5 (five) minutes as needed for chest pain.    [provider]  oxyCODONE (ROXICODONE) 5 MG immediate release tablet Take 2 tablets (10 mg total) by mouth every 8 (eight) hours as needed for up to 12 doses for severe pain. 03/04/22   Wyvonnia Dusky,  MD  pantoprazole (PROTONIX) 40 MG tablet Take 40 mg by mouth 2 (two) times daily. 11/30/21   [provider]  potassium chloride (K-DUR) 10 MEQ tablet Take 30 mEq by mouth daily.    [provider]  SYMBICORT 160-4.5 MCG/ACT inhaler Inhale 2 puffs into the lungs in the morning and at bedtime. 12/19/21   [provider]  triamcinolone cream (KENALOG) 0.1 % Apply 1 application. topically 2 (two) times daily as needed (itching). 12/14/21   [provider]      Allergies     Ativan [lorazepam], Keflex [cephalexin], Ace inhibitors, Prednisone, and Sulfa antibiotics    Review of Systems   Review of Systems  Constitutional:  Negative for chills and fever.  Eyes:  Negative for visual disturbance.  Respiratory:  Negative for shortness of breath.   Cardiovascular:  Negative for chest pain.  Gastrointestinal:  Positive for abdominal pain. Negative for abdominal distention, anal bleeding, blood in stool, constipation, diarrhea, nausea, rectal pain and vomiting.  Genitourinary:  Positive for flank pain and testicular pain. Negative for difficulty urinating, dysuria, frequency, genital sores, hematuria, penile discharge, penile pain, penile swelling, scrotal swelling and urgency.  Musculoskeletal:  Positive for back pain. Negative for neck pain.  Skin:  Negative for color change, pallor, rash and wound.  Neurological:  Negative for dizziness, syncope, light-headedness and headaches.  Psychiatric/Behavioral:  Negative for confusion.     Physical Exam Updated Vital Signs BP (!) 184/85 (BP Location: Right Arm)   Pulse (!) 56   Temp 98.3 F (36.8 C) (Oral)   Resp 18   Ht '5\' 9"'$  (1.753 m)   Wt 93 kg   SpO2 98%   BMI 30.28 kg/m  Physical Exam Vitals and nursing note reviewed.  Constitutional:      General: He is not in acute distress.    Appearance: He is not ill-appearing, toxic-appearing or diaphoretic.  Eyes:     General:        Right eye: No discharge.        Left eye: No discharge.     Extraocular Movements: Extraocular movements intact.     Conjunctiva/sclera: Conjunctivae normal.     Pupils: Pupils are equal, round, and reactive to light.  Cardiovascular:     Rate and Rhythm: Normal rate.  Pulmonary:     Effort: Pulmonary effort is normal.  Abdominal:     General: Abdomen is protuberant. There is no distension. There are no signs of injury.     Palpations: Abdomen is soft. There is no mass or pulsatile mass.     Tenderness: There is no abdominal  tenderness. There is no guarding or rebound.  Musculoskeletal:     Cervical back: Normal range of motion and neck supple. No swelling, edema, deformity, erythema, signs of trauma, lacerations, rigidity, spasms, torticollis, tenderness, bony tenderness or crepitus. No pain with movement. Normal range of motion.     Thoracic back: No swelling, edema, deformity, signs of trauma, lacerations, spasms, tenderness or bony tenderness.     Lumbar back: No swelling, edema, deformity, signs of trauma, lacerations, spasms, tenderness or bony tenderness. Negative right straight leg raise test and negative left straight leg raise test.     Comments: No midline tenderness or deformity to cervical, thoracic, or lumbar spine.  Skin:    General: Skin is warm and dry.  Neurological:     General: No focal deficit present.     Mental Status: He is alert.  GCS: GCS eye subscore is 4. GCS verbal subscore is 5. GCS motor subscore is 6.     Cranial Nerves: No cranial nerve deficit or facial asymmetry.     Sensory: Sensation is intact.     Motor: No weakness, tremor, seizure activity or pronator drift.     Coordination: Finger-Nose-Finger Test normal.     Comments: CN II-XII intact; performed in supine position, +5 strength to bilateral upper extremities, +5 strength to dorsiflexion and plantarflexion, patient able to lift both legs against gravity and hold each there without difficulty.  Sensation to light touch grossly intact to bilateral upper and lower extremities  Psychiatric:        Behavior: Behavior is cooperative.     ED Results / Procedures / Treatments   Labs (all labs ordered are listed, but only abnormal results are displayed) Labs Reviewed  CBC - Abnormal; Notable for the following components:      Result Value   Hemoglobin 11.7 (*)    HCT 37.0 (*)    All other components within normal limits  COMPREHENSIVE METABOLIC PANEL - Abnormal; Notable for the following components:   Potassium 3.3 (*)     Glucose, Bld 216 (*)    Calcium 8.8 (*)    Total Protein 6.0 (*)    All other components within normal limits  URINALYSIS, ROUTINE W REFLEX MICROSCOPIC - Abnormal; Notable for the following components:   APPearance HAZY (*)    Glucose, UA 50 (*)    All other components within normal limits    EKG None  Radiology No results found.  Procedures Procedures    Medications Ordered in ED Medications - No data to display  ED Course/ Medical Decision Making/ A&P                           Medical Decision Making Amount and/or Complexity of Data Reviewed Radiology: ordered.  Risk Prescription drug management.   Alert 70 year old male in no acute distress, nontoxic-appearing.  Presents emerged part with a complaint of testicular pain and right-sided abdomen/back/leg pain.  Information is obtained from patient patient's wife at bedside.  I reviewed patient's past medical records including previous pulm notes, notes from admission/discharge, labs, and imaging.  Patient has medical history as outlined in HPI was complicates his care.  Per chart review patient was seen on 03/04/2022 for similar complaints.  Ultrasound imaging showed no signs of DVT.  Scrotal ultrasound showed small bilateral hydroceles.  Laboratory testing was reassuring.  Patient was discharged with prescription for Percocet.  Patient presents today with worsening of his pain.  Will obtain CT abdomen pelvis with no charge lumbar spine as well as MRI of lumbar spine to evaluate for further causes of patient's pain.  Patient given Dilaudid for pain management.  I personally viewed interpret patient's lab results.  Pertinent findings include: -Urinalysis shows no signs of infection -Potassium 3.3 -Hemoglobin 11.7, improved from previous  I personally viewed and interpreted patient's CT imaging.  Agree with radiology interpretation of: -No acute intra-abdominal or pelvic abnormality.  Cryoablation changes in the right  kidney without evidence for recurrent mass -No acute osseous abnormality of lumbar spine.  Multilevel degenerative changes most significant at L5-S1  Patient had worsening pain after receiving from CT.  Additional Dilaudid placed for patient.  At handoff MRI imaging is pending.  Disposition pending imaging.  Patient care transferred to PA Sanders at the end of my shift. Patient presentation,  ED course, and plan of care discussed with review of all pertinent labs and imaging. Please see his/her note for further details regarding further ED course and disposition.         Final Clinical Impression(s) / ED Diagnoses Final diagnoses:  None    Rx / DC Orders ED Discharge Orders     None         Dyann Ruddle 03/07/22 2359    Dorie Rank, MD 03/09/22 [phone]

## 2022-03-07 NOTE — ED Triage Notes (Signed)
Pt arrives POV for eval of scrotum, R sided leg, lower back and groin pain x 1.5 weeks. Pt reports "every time I walk it feels like someone is kicking me right in the testicles". Pt was seen at [facility] for same on 6/27 w/ no remarkable findings. Pt reports he was advised to f/u w/ PCP for MRI.

## 2022-03-07 NOTE — ED Provider Triage Note (Cosign Needed Addendum)
Emergency Medicine Provider Triage Evaluation Note  Jason Muckey Bensman Sr. , a 71 y.o. male  was evaluated in triage.  Pt complains of ongoing right back, flank, radiation into right testicle pain.  Patient was seen evaluated on 6/27 by Dr. Langston Masker who performed a extensive work-up including DVT study, ultrasound of the scrotum which noted bilateral hydroceles without evidence of torsion or other abnormality.  Patient had CT of the abdomen in April for prior symptoms which were similar to today's presentation which showed some scarring related to renal cell carcinoma without any other acute finding.  Patient presents today with ongoing severe pain that seems like it is worsening.  Dr. Langston Masker had recommended a MRI of the lumbar spine as next evaluation, patient reports that his PCP cannot perform this test. He denies numbness, tingling, fever, chills.  Review of Systems  Positive: Testicle pain, flank pain, back pain Negative: Fever, chills, nausea, vomiting  Physical Exam  BP (!) 184/85 (BP Location: Right Arm)   Pulse (!) 56   Temp 98.3 F (36.8 C) (Oral)   Resp 18   Ht '5\' 9"'$  (1.753 m)   Wt 93 kg   SpO2 98%   BMI 30.28 kg/m  Gen:   Awake, no distress   Resp:  Normal effort  MSK:   Moves extremities without difficulty  Other:  Normal appearance of testicles, no hernia noted. TTP in right flank, rlq, right paraspinous spine  Medical Decision Making  Medically screening exam initiated at 6:50 PM.  Appropriate orders placed.  Jason Hashimi Fournier Sr. was informed that the remainder of the evaluation will be completed by another provider, this initial triage assessment does not replace that evaluation, and the importance of remaining in the ED until their evaluation is complete.  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  He is receiving a CT abdomen pelvis,  we will try to visualize his lumbar spine in CT abdomen pelvis.    [name], [place] 03/07/22 2047

## 2022-03-08 MED ORDER — METHOCARBAMOL 500 MG PO TABS
500.0000 mg | ORAL_TABLET | Freq: Two times a day (BID) | ORAL | 0 refills | Status: DC
Start: 2022-03-08 — End: 2024-02-21

## 2022-03-08 MED ORDER — METOCLOPRAMIDE HCL 5 MG/ML IJ SOLN
10.0000 mg | INTRAMUSCULAR | Status: AC
  Administered 2022-03-08: 10 mg via INTRAVENOUS
  Filled 2022-03-08: qty 2

## 2022-03-08 MED ORDER — LIDOCAINE 5 % EX PTCH: 1.0000 | MEDICATED_PATCH | CUTANEOUS | 0 refills | Status: DC

## 2022-03-08 NOTE — ED Notes (Signed)
Per wife's request, security called to walk with wife to car

## 2022-03-08 NOTE — Discharge Instructions (Signed)
Take the prescribed medication as directed.  Can continue the oxycodone as directed. Follow-up with neurosurgery-- call for appt. Return to the ED for new or worsening symptoms--- cannot walk, cant control bladder/bowels, high fevers, etc.

## 2022-03-08 NOTE — ED Provider Notes (Signed)
Results for orders placed or performed during the hospital encounter of 03/07/22  CBC  Result Value Ref Range   WBC 6.1 4.0 - 10.5 K/uL   RBC 4.29 4.22 - 5.81 MIL/uL   Hemoglobin 11.7 (L) 13.0 - 17.0 g/dL   HCT 78.2 (L) 95.6 - 21.3 %   MCV 86.2 80.0 - 100.0 fL   MCH 27.3 26.0 - 34.0 pg   MCHC 31.6 30.0 - 36.0 g/dL   RDW 08.6 57.8 - 46.9 %   Platelets 179 150 - 400 K/uL   nRBC 0.0 0.0 - 0.2 %  Comprehensive metabolic panel  Result Value Ref Range   Sodium 140 135 - 145 mmol/L   Potassium 3.3 (L) 3.5 - 5.1 mmol/L   Chloride 107 98 - 111 mmol/L   CO2 25 22 - 32 mmol/L   Glucose, Bld 216 (H) 70 - 99 mg/dL   BUN 14 8 - 23 mg/dL   Creatinine, Ser 6.29 0.61 - 1.24 mg/dL   Calcium 8.8 (L) 8.9 - 10.3 mg/dL   Total Protein 6.0 (L) 6.5 - 8.1 g/dL   Albumin 3.5 3.5 - 5.0 g/dL   AST 18 15 - 41 U/L   ALT 15 0 - 44 U/L   Alkaline Phosphatase 91 38 - 126 U/L   Total Bilirubin 0.6 0.3 - 1.2 mg/dL   GFR, Estimated >52 >84 mL/min   Anion gap 8 5 - 15  Urinalysis, Routine w reflex microscopic  Result Value Ref Range   Color, Urine YELLOW YELLOW   APPearance HAZY (A) CLEAR   Specific Gravity, Urine 1.023 1.005 - 1.030   pH 5.0 5.0 - 8.0   Glucose, UA 50 (A) NEGATIVE mg/dL   Hgb urine dipstick NEGATIVE NEGATIVE   Bilirubin Urine NEGATIVE NEGATIVE   Ketones, ur NEGATIVE NEGATIVE mg/dL   Protein, ur NEGATIVE NEGATIVE mg/dL   Nitrite NEGATIVE NEGATIVE   Leukocytes,Ua NEGATIVE NEGATIVE   MR LUMBAR SPINE WO CONTRAST  Result Date: 03/08/2022 CLINICAL DATA:  Initial evaluation for low back pain, right groin and leg pain. EXAM: MRI LUMBAR SPINE WITHOUT CONTRAST TECHNIQUE: Multiplanar, multisequence MR imaging of the lumbar spine was performed. No intravenous contrast was administered. COMPARISON:  Prior CT from 03/07/2022. FINDINGS: Segmentation: Standard. Lowest well-formed disc space labeled the L5-S1 level. Alignment: 4 mm retrolisthesis of L5 on S1. Additional trace degenerative  retrolisthesis of L3 on L4 noted. Alignment otherwise normal preservation of the normal lumbar lordosis. Vertebrae: Vertebral body height maintained without acute or chronic fracture. Bone marrow signal intensity diffusely heterogeneous with multiple scattered benign hemangiomata noted. No worrisome osseous lesions. No abnormal marrow edema. Conus medullaris and cauda equina: Conus extends to the L2 level. Conus within normal limits. Nerve roots of the cauda equina are irregular due to compressive stenosis. Paraspinal and other soft tissues: Paraspinous soft tissues demonstrate no acute finding. Exophytic heterogeneous lesion measuring 1.6 cm seen extending from the posterior right kidney, consistent with known renal cell carcinoma, status post cryoablation. Finding incompletely assessed on this exam. Multiple additional scattered benign appearing cyst noted about the kidneys bilaterally. Disc levels: T12-L1: Mild disc bulge with disc desiccation. Mild facet hypertrophy. No significant spinal stenosis. Foramina remain patent. L1-2: Degenerative intervertebral disc space narrowing with diffuse disc bulge and disc desiccation. Reactive endplate spurring. Mild to moderate right greater left facet hypertrophy. Resultant mild narrowing of the right lateral recess. Central canal remains patent. No significant foraminal stenosis. L2-3: Disc bulge with disc desiccation. Associated reactive endplate spurring. Superimposed  left subarticular disc extrusion with inferior migration. Moderate facet and ligament flavum hypertrophy. Resultant severe canal with left lateral recess stenosis. Additional moderate narrowing of the contralateral right lateral recess. Mild left L2 foraminal stenosis. Right neural foramen remains patent. L3-4: Disc bulge with disc desiccation and intervertebral disc space narrowing. Reactive endplate spurring. Mild to moderate facet hypertrophy. Resultant moderate canal with mild to moderate left greater  than right lateral recess stenosis possible superimposed tiny synovial cyst measuring 2 mm at the anterior margin of the left L3-4 facet (series 8, image 22). Resultant moderate canal with bilateral lateral recess stenosis. Mild right with moderate left L3 foraminal narrowing. L4-5: Disc desiccation with mild disc bulge. Mild reactive endplate spurring. Superimposed right foraminal to extraforaminal disc protrusion contacts the exiting right L4 nerve root (series 9, image 28). Moderate right worse than left facet hypertrophy. Resultant mild bilateral subarticular stenosis. Mild bilateral L4 foraminal narrowing. L5-S1: Retrolisthesis. Advanced degenerative intervertebral disc space narrowing with disc desiccation and diffuse disc bulge. Reactive endplate spurring. Mild to moderate facet hypertrophy. No significant spinal stenosis. Moderate bilateral L5 foraminal narrowing. IMPRESSION: 1. No acute abnormality within the lumbar spine. 2. Moderate multilevel degenerative spondylosis with resultant moderate to severe spinal stenosis at L2-3 and L3-4, most pronounced at L2-3. 3. Right foraminal to extraforaminal disc protrusion at L4-5, potentially affecting the exiting right L4 nerve root. 4. Moderate bilateral L5 foraminal stenosis related to disc bulge, reactive endplate spurring, and facet hypertrophy. 5. 1.6 cm exophytic heterogeneous lesion extending from the posterior right kidney, consistent with prior ablation site related to known renal cell carcinoma. This is incompletely assessed on this exam. Attention at follow-up recommended. Electronically Signed   By: Rise Mu M.D.   On: 03/08/2022 02:36   CT L-SPINE NO CHARGE  Result Date: 03/07/2022 CLINICAL DATA:  Right lower quadrant pain EXAM: CT  Lumbar spine without contrast TECHNIQUE: Multiplanar CT images of the thoracic and lumbar spine were reconstructed from contemporary CT of the Chest, Abdomen, and Pelvis. RADIATION DOSE REDUCTION: This exam  was performed according to the departmental dose-optimization program which includes automated exposure control, adjustment of the mA and/or kV according to patient size and/or use of iterative reconstruction technique. CONTRAST:  None or No additional COMPARISON:  None Available. FINDINGS: CT LUMBAR SPINE FINDINGS Segmentation: 5 lumbar type vertebrae. Alignment: Normal. Vertebrae: No fracture. Sclerosis within the right iliac bone most likely represents a bone island. Paraspinal and other soft tissues: Negative. Disc levels: At L1-L2, mild disc space narrowing. No high-grade canal stenosis. Mild facet degenerative changes. No high-grade foraminal narrowing. At L2-L3, mild disc space narrowing. Mild diffuse disc bulge. No high-grade canal stenosis. Mild facet degenerative changes. No high-grade foraminal narrowing. At L3-L4, mild disc space narrowing. Diffuse disc bulge. Canal appears mildly narrowed. Mild facet degenerative changes. No high-grade foraminal narrowing. At L4-L5, maintained disc space. Moderate facet degenerative changes. No high-grade canal stenosis. No high-grade foraminal narrowing. At L5-S1, advanced disc space narrowing. No canal stenosis. Moderate facet degenerative changes. Moderate bilateral foraminal narrowing. IMPRESSION: 1. No acute osseous abnormality 2. Multilevel degenerative changes, most significant at L5-S1. Electronically Signed   By: Jasmine Pang M.D.   On: 03/07/2022 23:54   CT ABDOMEN PELVIS W CONTRAST  Result Date: 03/07/2022 CLINICAL DATA:  Right lower quadrant pain EXAM: CT ABDOMEN AND PELVIS WITH CONTRAST TECHNIQUE: Multidetector CT imaging of the abdomen and pelvis was performed using the standard protocol following bolus administration of intravenous contrast. RADIATION DOSE REDUCTION: This exam was performed  according to the departmental dose-optimization program which includes automated exposure control, adjustment of the mA and/or kV according to patient size and/or  use of iterative reconstruction technique. CONTRAST:  OMNIPAQUE IOHEXOL 300 MG/ML  SOLN COMPARISON:  CT 12/21/2021, 10/29/2021, 10/26/2020, 10/24/2019 FINDINGS: Lower chest: Lung bases demonstrate a stable 6 mm right upper lobe pulmonary nodule consistent with benign finding. No follow-up imaging is recommended. Hepatobiliary: No focal liver abnormality is seen. Status post cholecystectomy. No biliary dilatation. Pancreas: Unremarkable. No pancreatic ductal dilatation or surrounding inflammatory changes. Spleen: Normal in size without focal abnormality. Adrenals/Urinary Tract: Adrenal glands are within normal limits. Stable post cryoablation changes in the right kidney. Stable hypodense cyst in the midpole left kidney, no follow-up imaging is recommended. No hydronephrosis. The bladder is unremarkable Stomach/Bowel: The stomach is nonenlarged. No dilated small bowel. No acute bowel wall thickening. Negative appendix, coronal series 8 image 78 through 80. Vascular/Lymphatic: Moderate aortic atherosclerosis. No aneurysm. No suspicious lymph nodes. Reproductive: Prostate is unremarkable. Other: Negative for pelvic effusion or free air Musculoskeletal: No acute osseous abnormality IMPRESSION: 1. No CT evidence for acute intra-abdominal or pelvic abnormality. Negative for acute appendicitis. 2. Cryo ablation changes in the right kidney without evidence for recurrent mass Electronically Signed   By: Jasmine Pang M.D.   On: 03/07/2022 23:42   DG Chest Portable 1 View  Result Date: 03/07/2022 CLINICAL DATA:  Check for ICD. EXAM: PORTABLE CHEST 1 VIEW COMPARISON:  PA Lat chest 12/30/2021. FINDINGS: Interval removal of prior single lead left chest cardiac assist device. Stable cardiomegaly. No vascular congestion is seen. There is slight aortic tortuosity with stable mediastinum and aortic atherosclerosis. The lungs are clear. No pleural effusion is seen. Thoracic spondylosis. IMPRESSION: Interval removal of left  chest single lead device. Otherwise stable chest with cardiomegaly and aortic atherosclerosis. No acute chest findings. Electronically Signed   By: Almira Bar M.D.   On: 03/07/2022 22:31   US SCROTUM W/DOPPLER  Result Date: 03/04/2022 CLINICAL DATA:  Right testicular pain EXAM: SCROTAL ULTRASOUND DOPPLER ULTRASOUND OF THE TESTICLES TECHNIQUE: Complete ultrasound examination of the testicles, epididymis, and other scrotal structures was performed. Color and spectral Doppler ultrasound were also utilized to evaluate blood flow to the testicles. COMPARISON:  12/22/2021 FINDINGS: Right testicle Measurements: 5.2 x 2.2 x 3.1 cm. No suspicious mass. Scattered microcalcifications. Left testicle Measurements: 4.6 x 2.1 x 3.3 cm. No suspicious mass. Scattered microcalcifications. Right epididymis:  3 mm cyst in the epididymal head. Left epididymis:  Normal in size and appearance. Hydrocele:  Small bilateral Varicocele:  None visualized. Pulsed Doppler interrogation of both testes demonstrates normal low resistance arterial and venous waveforms bilaterally. IMPRESSION: No suspicious testicular mass.  No evidence of torsion. Small bilateral hydroceles. Scattered testicular microlithiasis bilaterally. Current literature suggests that testicular microlithiasis is not a significant independent risk factor for development of testicular carcinoma, and that follow up imaging is not warranted in the absence of other risk factors. Monthly testicular self-examination and annual physical exams are considered appropriate surveillance. If patient has other risk factors for testicular carcinoma, then referral to Urology should be considered. (Reference: DeCastro, et al.: A 5-Year Follow up Study of Asymptomatic Men with Testicular Microlithiasis. J Urol 2008; 179:1420-1423.). Electronically Signed   By: Charlett Nose M.D.   On: 03/04/2022 17:31   US Venous Img Lower Unilateral Right  Result Date: 03/04/2022 CLINICAL DATA:  Right  leg pain EXAM: RIGHT LOWER EXTREMITY VENOUS DOPPLER ULTRASOUND TECHNIQUE: Gray-scale sonography with compression, as well as color  and duplex ultrasound, were performed to evaluate the deep venous system(s) from the level of the common femoral vein through the popliteal and proximal calf veins. COMPARISON:  None Available. FINDINGS: VENOUS Normal compressibility of the common femoral, superficial femoral, and popliteal veins, as well as the visualized calf veins. Visualized portions of profunda femoral vein and great saphenous vein unremarkable. No filling defects to suggest DVT on grayscale or color Doppler imaging. Doppler waveforms show normal direction of venous flow, normal respiratory plasticity and response to augmentation. Limited views of the contralateral common femoral vein are unremarkable. OTHER None. Limitations: none IMPRESSION: Negative. Electronically Signed   By: Charlett Nose M.D.   On: 03/04/2022 17:13    CT abdomen/pelvis without acute findings.  MRI with multilevel degenerative changes, disc protrusion at L4-L5 causing L4 nerve compression.  No findings of cauda equina.  Suspect disc protrusion causing his issues as pain primarily right sided.  Patient comfortable and resting currently.  Remains without focal deficit.  Results discussed with patient and wife, they acknowledge understanding.  Will refer to neurosurgery for follow-up.  He is already on oxycodone and is tolerating well.  He has diabetic so we will avoid steroids for now but will add on muscle relaxer and Lidoderm patches as needed.  Print outs of results from today's visit given to wife for review.  Follow-up with PCP in the interim if needed.  Can return here for any new or acute changes.   Garlon Hatchet, PA-C 03/08/22 [id number]    Nira Conn, MD 03/09/22 [phone]

## 2022-03-08 NOTE — ED Notes (Signed)
Patient transported to MRI 

## 2022-03-08 NOTE — ED Notes (Signed)
E-signature pad unavailable at time of pt discharge. This RN discussed discharge materials with pt and answered all pt questions. Pt stated understanding of discharge material. ? ?

## 2022-04-01 ENCOUNTER — Telehealth: Payer: Self-pay | Admitting: Internal Medicine

## 2022-04-01 NOTE — Telephone Encounter (Signed)
Pt c/o medication issue:  1. Name of Medication: apixaban (ELIQUIS) 5 MG TABS tablet  2. How are you currently taking this medication (dosage and times per day)? As written  3. Are you having a reaction (difficulty breathing--STAT)? no  4. What is your medication issue? Pt wife calling stating that went to see a Dermatologist because he has been itching very bad, and the dermatologist seems to think it is from the eliquis. They want to know whether they need to switch it or not. Please advise

## 2022-04-01 NOTE — Telephone Encounter (Signed)
I spoke with patient's wife. She reports patient was on Eliquis for 4-5 days. It was then stopped and restarted about a month ago.  For the past week patient has been itching over most of his body.  Worse in arm and chest area.  No rash.  [facility] [place] and it was felt itching may be related to Eliquis.  Patient's wife aware patient should continue Eliquis and that office note from Dermatology would be requested for Dr Lovena Le to review. I spoke with Dermatology office (phone [phone]) and they will fax note from today to our office.

## 2022-04-03 NOTE — Telephone Encounter (Signed)
Documentation received from dermatology office.  Per note "itching-thinks it is from Eliquis-no rash except elbow"  Returned call to wife.  She states he only has a rash on his elbow, but he is itchy on the top half of his body.  She states his lower half is not itchy.  Advised would discuss with [facility] upon his return.  Advised to continue his Eliquis for now.  Wife in agreement with plan.

## 2022-04-10 NOTE — Telephone Encounter (Signed)
Discussed with Dr. Lovena Le.  Ok to try Xarelto if he is still itching.  Outreach made to Pt's wife.  Per wife Pt has not been complaining of itching the last few days.  He did receive a shot at the dermatology office.  Advised if Pt is still itching to call this nurse back and we can change Pt to Xarelto 20 mg daily.  She will only call if Pt is still itching.  If itching has resolved Pt will continue Eliquis.    Wife in agreement with plan and will call this nurse back today IF need to switch to Xarelto.  Await further needs.

## 2022-04-15 ENCOUNTER — Ambulatory Visit: Payer: Medicare HMO | Admitting: Internal Medicine

## 2022-04-24 ENCOUNTER — Other Ambulatory Visit: Payer: Self-pay

## 2022-04-24 DIAGNOSIS — Z85528 Personal history of other malignant neoplasm of kidney: Secondary | ICD-10-CM | POA: Diagnosis not present

## 2022-04-24 DIAGNOSIS — X501XXA Overexertion from prolonged static or awkward postures, initial encounter: Secondary | ICD-10-CM | POA: Insufficient documentation

## 2022-04-24 DIAGNOSIS — N189 Chronic kidney disease, unspecified: Secondary | ICD-10-CM | POA: Insufficient documentation

## 2022-04-24 DIAGNOSIS — Z794 Long term (current) use of insulin: Secondary | ICD-10-CM | POA: Insufficient documentation

## 2022-04-24 DIAGNOSIS — I2510 Atherosclerotic heart disease of native coronary artery without angina pectoris: Secondary | ICD-10-CM | POA: Diagnosis not present

## 2022-04-24 DIAGNOSIS — E1122 Type 2 diabetes mellitus with diabetic chronic kidney disease: Secondary | ICD-10-CM | POA: Diagnosis not present

## 2022-04-24 DIAGNOSIS — S86012A Strain of left Achilles tendon, initial encounter: Secondary | ICD-10-CM | POA: Diagnosis not present

## 2022-04-24 DIAGNOSIS — S99922A Unspecified injury of left foot, initial encounter: Secondary | ICD-10-CM | POA: Diagnosis present

## 2022-04-24 DIAGNOSIS — Z7901 Long term (current) use of anticoagulants: Secondary | ICD-10-CM | POA: Insufficient documentation

## 2022-04-24 DIAGNOSIS — I129 Hypertensive chronic kidney disease with stage 1 through stage 4 chronic kidney disease, or unspecified chronic kidney disease: Secondary | ICD-10-CM | POA: Insufficient documentation

## 2022-04-24 DIAGNOSIS — Z79899 Other long term (current) drug therapy: Secondary | ICD-10-CM | POA: Diagnosis not present

## 2022-04-24 NOTE — ED Triage Notes (Signed)
L foot injury on Tues. Evening.  Pt. Fell on the L foot and ankle.  He has been walking on the L foot until today but pain has gotten worse.  Noted edema and purple bruising in the L ankle and L foot.  Pt. Reports shooting sharp pains in the L foot.

## 2022-04-25 ENCOUNTER — Emergency Department (HOSPITAL_BASED_OUTPATIENT_CLINIC_OR_DEPARTMENT_OTHER)
Admission: EM | Admit: 2022-04-25 | Discharge: 2022-04-25 | Disposition: A | Discharge status: Home / Self Care | Payer: Medicare HMO | Attending: Emergency Medicine | Admitting: Emergency Medicine

## 2022-04-25 ENCOUNTER — Encounter (HOSPITAL_BASED_OUTPATIENT_CLINIC_OR_DEPARTMENT_OTHER): Payer: Self-pay | Admitting: *Deleted

## 2022-04-25 ENCOUNTER — Emergency Department (HOSPITAL_BASED_OUTPATIENT_CLINIC_OR_DEPARTMENT_OTHER): Payer: Medicare HMO

## 2022-04-25 DIAGNOSIS — S86012A Strain of left Achilles tendon, initial encounter: Secondary | ICD-10-CM

## 2022-04-25 MED ORDER — OXYCODONE-ACETAMINOPHEN 5-325 MG PO TABS
1.0000 | ORAL_TABLET | Freq: Once | ORAL | Status: AC
  Administered 2022-04-25: 1 via ORAL
  Filled 2022-04-25: qty 1

## 2022-04-25 MED ORDER — OXYCODONE HCL 5 MG PO TABS: 5.0000 mg | ORAL_TABLET | ORAL | 0 refills | Status: DC | PRN

## 2022-04-25 NOTE — ED Notes (Signed)
Portable XR at bedside

## 2022-04-25 NOTE — ED Notes (Signed)
Rx x 1 given  Written and verbal inst to pt  Verbalized an understanding  To home with wife

## 2022-04-25 NOTE — ED Provider Notes (Signed)
[facility] DEPT [facility] Provider Note: Georgena Spurling, MD, FACEP  CSN: [id number] MRN: [id number] ARRIVAL: 04/24/22 at 2336 ROOM: [place] [facility]   HISTORY OF PRESENT ILLNESS  04/25/22 12:08 AM Jason Ducking Stamper Sr. is a 71 y.o. male who twisted his left ankle 3 days ago.  He had been walking on the left lower extremity until yesterday but the pain became worse and he felt the ankle had become unstable.  He describes this instability as feeling like he is walking on a balloon.  The left ankle is ecchymotic and swollen.  He has pain that shoots into his left foot.  He rates the pain as an 8 out of 10 and describes it as spasm-like and shooting.   Past Medical History:  Diagnosis Date   Acute metabolic encephalopathy 7/32/2025   Arm DVT (deep venous thromboembolism), acute (HCC)    Bleeding from wound 01/12/2022   Brachial plexopathy 05/14/2015   Cellulitis of middle toe 02/25/2016   Cervical radiculopathy at C8 05/10/2015   Chronic kidney disease    Cord compression ([place]) 05/10/2015   Coronary artery disease    Deep vein thrombosis (DVT) of distal vein of left lower extremity of indeterminate age [facility]) 12/29/2021   Diabetic ulcer of toe of left foot associated with diabetes mellitus due to underlying condition, with fat layer exposed ([place]) 07/04/2021   DVT of axillary vein, acute right ([place]) 10/25/2017   Empyema of gallbladder 10/24/2017   GIB (gastrointestinal bleeding) 10/20/2017   History of kidney cancer 02/10/2019   Hypercholesterolemia 04/22/2016   Hypertension    Iron deficiency anemia due to chronic blood loss 07/29/2017   Formatting of this note might be different from the original. Added automatically from request for surgery [id number]   Metatarsalgia of left foot 08/21/2015   Metatarsalgia of right foot 07/03/2015   MRSA bacteremia 06/18/2017   MVP (mitral valve prolapse) 06/10/2013   Myocardial infarction [facility])    pt states that he had a "light heart attack  about 25 yrs ago"   Pacemaker    Pacemaker pocket hematoma 01/12/2022   Pneumonia    Renal cell carcinoma of right kidney ([place]) 06/09/2019   Right renal mass 03/31/2016   S/P placement of cardiac pacemaker    Scrotal pain 12/22/2021   Severe sepsis ([place]) 12/22/2021   Snoring 08/25/2019   Stroke ([place])    approx 15 yrs ago    Past Surgical History:  Procedure Laterality Date   BUBBLE STUDY  12/26/2021   Procedure: BUBBLE STUDY;  Surgeon: [name], MD;  Location: [place];  Service: Cardiovascular;;   IR GENERIC HISTORICAL  04/22/2016   IR RADIOLOGIST EVAL & MGMT 04/22/2016 Greggory Keen, MD [facility] INTERV RAD   IR RADIOLOGIST EVAL & MGMT  05/18/2019   IR RADIOLOGIST EVAL & MGMT  07/06/2019   IR RADIOLOGIST EVAL & MGMT  10/26/2019   IR RADIOLOGIST EVAL & MGMT  10/30/2020   IR RADIOLOGIST EVAL & MGMT  11/01/2021   PACEMAKER IMPLANT  05/2017   PACEMAKER LEAD REMOVAL N/A 12/29/2021   Procedure: PACEMAKER LEAD REMOVAL;  Surgeon: Evans Lance, MD;  Location: [place];  Service: Cardiovascular;  Laterality: N/A;   PACEMAKER REMOVAL  07/2017   TEE WITHOUT CARDIOVERSION N/A 12/26/2021   Procedure: TRANSESOPHAGEAL ECHOCARDIOGRAM (TEE);  Surgeon: [name], MD;  Location: [facility] ENDOSCOPY;  Service: Cardiovascular;  Laterality: N/A;    No family history on file.  Social History  Tobacco Use   Smoking status: Never   Smokeless tobacco: Never  Substance Use Topics   Alcohol use: No    Alcohol/week: 0.0 standard drinks of alcohol   Drug use: No    Prior to Admission medications   Medication Sig Start Date End Date Taking? Authorizing Provider  acetaminophen (TYLENOL) 500 MG tablet Take 2 tablets (1,000 mg total) by mouth every 6 (six) hours as needed. Patient taking differently: Take 500 mg by mouth every 6 (six) hours as needed. 01/01/22 01/01/23  Nita Sells, MD  amLODipine (NORVASC) 2.5 MG tablet  01/23/16   [provider]  apixaban (ELIQUIS) 5 MG TABS tablet  Take 1 tablet (5 mg total) by mouth 2 (two) times daily. 02/19/22   Evans Lance, MD  atorvastatin (LIPITOR) 40 MG tablet Take 40 mg by mouth daily.    [provider]  carisoprodol (SOMA) 350 MG tablet Take 350 mg by mouth at bedtime as needed for muscle spasms.    [provider]  doxazosin (CARDURA) 2 MG tablet Take 2 mg by mouth at bedtime.    [provider]  furosemide (LASIX) 40 MG tablet Take 40 mg by mouth daily.    [provider]  IFEREX 150 150 MG capsule Take 150 mg by mouth at bedtime. 10/07/21   [provider]  Insulin Aspart Prot & Aspart (NOVOLOG MIX 70/30 [place]) Inject 20-30 Units into the skin every morning. 30 units in the morning, 20 units at bedtime    [provider]  lidocaine (LIDODERM) 5 % Place 1 patch onto the skin daily. Remove & Discard patch within 12 hours or as directed by MD 03/08/22   Larene Pickett, PA-C  losartan (COZAAR) 100 MG tablet Take 1 tablet (100 mg total) by mouth daily. 01/14/22   Ezequiel Essex, MD  methocarbamol (ROBAXIN) 500 MG tablet Take 1 tablet (500 mg total) by mouth 2 (two) times daily. 03/08/22   Larene Pickett, PA-C  Multiple Vitamins-Minerals (CENTRUM SILVER ADULT 50+ PO) Take 1 tablet by mouth daily.    [provider]  nitroGLYCERIN (NITROSTAT) 0.4 MG SL tablet Place 0.4 mg under the tongue every 5 (five) minutes as needed for chest pain.    [provider]  oxyCODONE (ROXICODONE) 5 MG immediate release tablet Take 2 tablets (10 mg total) by mouth every 8 (eight) hours as needed for up to 12 doses for severe pain. 03/04/22   Wyvonnia Dusky, MD  pantoprazole (PROTONIX) 40 MG tablet Take 40 mg by mouth 2 (two) times daily. 11/30/21   [provider]  potassium chloride (K-DUR) 10 MEQ tablet Take 30 mEq by mouth daily.    [provider]  SYMBICORT 160-4.5 MCG/ACT inhaler Inhale 2 puffs into the lungs in the morning and at bedtime. 12/19/21   [provider]  triamcinolone cream (KENALOG) 0.1 % Apply 1 application. topically 2 (two) times daily as needed (itching). 12/14/21   [provider]    Allergies Ativan [lorazepam], Keflex [cephalexin], Ace inhibitors, Prednisone, and Sulfa antibiotics   REVIEW OF SYSTEMS  Negative except as noted here or in the History of Present Illness.   PHYSICAL EXAMINATION  Initial Vital Signs Blood pressure (!) 159/71, pulse (!) 57, temperature 98.1 F (36.7 C), temperature source Oral, height '5\' 9"'$  (1.753 m), weight 94.3 kg, SpO2 98 %.  Examination General: Well-developed, well-nourished male in no acute distress; appearance consistent with age of record HENT: normocephalic; atraumatic Eyes: Normal  appearance Neck: supple Heart: regular rate and rhythm Lungs: clear to auscultation bilaterally Abdomen: soft; nondistended; nontender; bowel sounds present Extremities: No deformity; ecchymosis, swelling and tenderness of left ankle and foot, positive Thompson test:    Neurologic: Awake, alert and oriented; motor function intact in all extremities and symmetric; no facial droop; hard of hearing Skin: Warm and dry Psychiatric: Normal mood and affect   RESULTS  Summary of this visit's results, reviewed and interpreted by myself:   EKG Interpretation  Date/Time:    Ventricular Rate:    PR Interval:    QRS Duration:   QT Interval:    QTC Calculation:   R Axis:     Text Interpretation:         Laboratory Studies: No results found for this or any previous visit (from the past 24 hour(s)). Imaging Studies: DG Foot Complete Left  Result Date: 04/25/2022 CLINICAL DATA:  Status post trauma. EXAM: LEFT FOOT - COMPLETE 3+ VIEW COMPARISON:  None Available. FINDINGS: A 15 mm x 9 mm cortical density is seen along the expected region of the left Achilles tendon. Marked severity soft tissue swelling is also seen posterior to the left calcaneus, along the expected region of the  insertion of the Achilles tendon. Degenerative changes are noted along the dorsal aspect of the proximal to mid left foot. A cluster of small soft tissue calcifications are seen within the plantar aspect of the proximal left foot. IMPRESSION: Findings concerning for rupture of the left Achilles tendon with associated avulsion fracture originating from the dorsal aspect of the proximal left calcaneus. MRI correlation is recommended. Electronically Signed   By: Virgina Norfolk M.D.   On: 04/25/2022 00:51   DG Ankle Complete Left  Result Date: 04/25/2022 CLINICAL DATA:  Status post fall. EXAM: LEFT ANKLE COMPLETE - 3+ VIEW COMPARISON:  December 22, 2021 FINDINGS: A small cortical defect of indeterminate age is seen involving the left lateral malleolus. Tiny chronic appearing cortical fragments are seen adjacent to the distal tip of the left medial malleolus. There is no evidence of dislocation. Mild degenerative changes are seen along the dorsal aspect of the proximal to mid left foot. There is disruption of Kager's fat pad with marked severity posterior soft tissue swelling, most prominent along the expected region of the insertion of the Achilles tendon. A 14 mm x 8 mm displaced cortical fragment is also seen within this region on the lateral view. IMPRESSION: 1. Small cortical defect of the left lateral malleolus of indeterminate age. Correlation with physical examination is recommended to determine the presence of point tenderness. 2. Findings consistent with a ruptured Achilles tendon and associated avulsion fracture originating from the dorsal aspect of the proximal calcaneus. MRI correlation is recommended. Electronically Signed   By: Virgina Norfolk M.D.   On: 04/25/2022 00:48    ED COURSE and MDM  Nursing notes, initial and subsequent vitals signs, including pulse oximetry, reviewed and interpreted by myself.  Vitals:   04/24/22 2359 04/25/22 0000  BP: (!) 159/71   Pulse: (!) 57   Temp: 98.1 F  (36.7 C)   TempSrc: Oral   SpO2: 98%   Weight:  94.3 kg  Height:  '5\' 9"'$  (1.753 m)   Medications - No data to display  Radiographic findings are consistent with [place] Silos test findings suggesting Achilles tendon rupture.  I suspect he had a partial rupture after his initial injury and completely ruptured at yesterday resulting in his sense of "walking on a balloon".  We will place him in a posterior splint and crutches (nonweightbearing) and refer to orthopedics.  He is established with Dr. Linton Rump in [facility].  He was advised this is a condition that likely will not resolve without surgical intervention.  PROCEDURES  Procedures   ED DIAGNOSES     ICD-10-CM   1. Achilles rupture, left, initial encounter  Z61.096E          Shanon Rosser, MD 04/25/22 4540

## 2022-05-28 ENCOUNTER — Ambulatory Visit: Payer: Medicare HMO | Admitting: Internal Medicine

## 2022-07-07 ENCOUNTER — Encounter: Payer: Self-pay | Admitting: Internal Medicine

## 2022-07-07 ENCOUNTER — Ambulatory Visit: Payer: Medicare HMO | Attending: Internal Medicine | Admitting: Internal Medicine

## 2022-07-07 VITALS — BP 150/88 | HR 76 | Ht 69.0 in | Wt 209.0 lb

## 2022-07-07 DIAGNOSIS — I10 Essential (primary) hypertension: Secondary | ICD-10-CM

## 2022-07-07 DIAGNOSIS — I480 Paroxysmal atrial fibrillation: Secondary | ICD-10-CM | POA: Diagnosis not present

## 2022-07-07 DIAGNOSIS — I495 Sick sinus syndrome: Secondary | ICD-10-CM | POA: Diagnosis not present

## 2022-07-07 NOTE — Patient Instructions (Addendum)
Medication Instructions:  Your physician recommends that you continue on your current medications as directed. Please refer to the Current Medication list given to you today.  TWO WEEKS WORTH OF ELIQUIS GIVEN TO PATIENT, AND APPLICATION  Lab Work: None ordered.  If you have labs (blood work) drawn today and your tests are completely normal, you will receive your results only by: Jason Jarvis (if you have MyChart) OR A paper copy in the mail If you have any lab test that is abnormal or we need to change your treatment, we will call you to review the results.  Testing/Procedures: None ordered.  Follow-Up: At [facility], you and your health needs are our priority.  As part of our continuing mission to provide you with exceptional heart care, we have created designated Provider Care Teams.  These Care Teams include your primary Cardiologist (physician) and Advanced Practice Providers (APPs -  Physician Assistants and Nurse Practitioners) who all work together to provide you with the care you need, when you need it.  We recommend signing up for the patient portal called "MyChart".  Sign up information is provided on this After Visit Summary.  MyChart is used to connect with patients for Virtual Visits (Telemedicine).  Patients are able to view lab/test results, encounter notes, upcoming appointments, etc.  Non-urgent messages can be sent to your provider as well.   To learn more about what you can do with MyChart, go to [email or web address].    Your next appointment:   AS NEEDED   The format for your next appointment:   In Person  Provider:   [name], MD{or one of the following Advanced Practice Providers on your designated Care Team:   Tommye Standard, [place] Legrand Como "Jonni Sanger" Chalmers Cater, [place]   Important Information About Sugar

## 2022-07-07 NOTE — Progress Notes (Signed)
HPI [name] returns today for followup. He is a pleasant 71 yo man with a h/o sinus node dysfunction s/p PPM insertion twice. He underwent extraction by me a month ago. He has done well in the interim except for some bleeding in his pocket. He has not had syncope. He feels well. Allergies  Allergen Reactions   Ativan [Lorazepam] Other (See Comments)    Hallucinated / Confused / Combative   Keflex [Cephalexin] Hives   Ace Inhibitors Cough        Prednisone Rash   Sulfa Antibiotics Rash     Current Outpatient Medications  Medication Sig Dispense Refill   acetaminophen (TYLENOL) 500 MG tablet Take 2 tablets (1,000 mg total) by mouth every 6 (six) hours as needed. (Patient taking differently: Take 500 mg by mouth every 6 (six) hours as needed.) 100 tablet 2   amLODipine (NORVASC) 2.5 MG tablet      apixaban (ELIQUIS) 5 MG TABS tablet Take 1 tablet (5 mg total) by mouth 2 (two) times daily. 60 tablet 11   atorvastatin (LIPITOR) 40 MG tablet Take 40 mg by mouth daily.     carisoprodol (SOMA) 350 MG tablet Take 350 mg by mouth at bedtime as needed for muscle spasms.     doxazosin (CARDURA) 2 MG tablet Take 2 mg by mouth at bedtime.     furosemide (LASIX) 40 MG tablet Take 40 mg by mouth daily.     IFEREX 150 150 MG capsule Take 150 mg by mouth at bedtime.     Insulin Aspart Prot & Aspart (NOVOLOG MIX 70/30 [place]) Inject 20-30 Units into the skin every morning. 30 units in the morning, 20 units at bedtime     lidocaine (LIDODERM) 5 % Place 1 patch onto the skin daily. Remove & Discard patch within 12 hours or as directed by MD 15 patch 0   losartan (COZAAR) 100 MG tablet Take 1 tablet (100 mg total) by mouth daily.     methocarbamol (ROBAXIN) 500 MG tablet Take 1 tablet (500 mg total) by mouth 2 (two) times daily. 20 tablet 0   Multiple Vitamins-Minerals (CENTRUM SILVER ADULT 50+ PO) Take 1 tablet by mouth daily.     nitroGLYCERIN (NITROSTAT) 0.4 MG SL tablet Place 0.4 mg under the  tongue every 5 (five) minutes as needed for chest pain.     oxyCODONE (ROXICODONE) 5 MG immediate release tablet Take 1 tablet (5 mg total) by mouth every 4 (four) hours as needed for severe pain. 20 tablet 0   pantoprazole (PROTONIX) 40 MG tablet Take 40 mg by mouth 2 (two) times daily.     potassium chloride (K-DUR) 10 MEQ tablet Take 30 mEq by mouth daily.     SYMBICORT 160-4.5 MCG/ACT inhaler Inhale 2 puffs into the lungs in the morning and at bedtime.     triamcinolone cream (KENALOG) 0.1 % Apply 1 application. topically 2 (two) times daily as needed (itching).     No current facility-administered medications for this visit.     Past Medical History:  Diagnosis Date   Acute metabolic encephalopathy 02/03/4131   Arm DVT (deep venous thromboembolism), acute (HCC)    Bleeding from wound 01/12/2022   Brachial plexopathy 05/14/2015   Cellulitis of middle toe 02/25/2016   Cervical radiculopathy at C8 05/10/2015   Chronic kidney disease    Cord compression ([place]) 05/10/2015   Coronary artery disease    Deep vein thrombosis (DVT) of distal vein of left  lower extremity of indeterminate age [facility]) 12/29/2021   Diabetic ulcer of toe of left foot associated with diabetes mellitus due to underlying condition, with fat layer exposed ([place]) 07/04/2021   DVT of axillary vein, acute right ([place]) 10/25/2017   Empyema of gallbladder 10/24/2017   GIB (gastrointestinal bleeding) 10/20/2017   History of kidney cancer 02/10/2019   Hypercholesterolemia 04/22/2016   Hypertension    Iron deficiency anemia due to chronic blood loss 07/29/2017   Formatting of this note might be different from the original. Added automatically from request for surgery [id number]   Metatarsalgia of left foot 08/21/2015   Metatarsalgia of right foot 07/03/2015   MRSA bacteremia 06/18/2017   MVP (mitral valve prolapse) 06/10/2013   Myocardial infarction [facility])    pt states that he had a "light heart attack about 25 yrs ago"   Pacemaker     Pacemaker pocket hematoma 01/12/2022   Pneumonia    Renal cell carcinoma of right kidney ([place]) 06/09/2019   Right renal mass 03/31/2016   S/P placement of cardiac pacemaker    Scrotal pain 12/22/2021   Severe sepsis ([place]) 12/22/2021   Snoring 08/25/2019   Stroke ([place])    approx 15 yrs ago    ROS:   All systems reviewed and negative except as noted in the HPI.   Past Surgical History:  Procedure Laterality Date   BUBBLE STUDY  12/26/2021   Procedure: BUBBLE STUDY;  Surgeon: [name], MD;  Location: [place];  Service: Cardiovascular;;   IR GENERIC HISTORICAL  04/22/2016   IR RADIOLOGIST EVAL & MGMT 04/22/2016 Greggory Keen, MD [facility] INTERV RAD   IR RADIOLOGIST EVAL & MGMT  05/18/2019   IR RADIOLOGIST EVAL & MGMT  07/06/2019   IR RADIOLOGIST EVAL & MGMT  10/26/2019   IR RADIOLOGIST EVAL & MGMT  10/30/2020   IR RADIOLOGIST EVAL & MGMT  11/01/2021   PACEMAKER IMPLANT  05/2017   PACEMAKER LEAD REMOVAL N/A 12/29/2021   Procedure: PACEMAKER LEAD REMOVAL;  Surgeon: Evans Lance, MD;  Location: [place];  Service: Cardiovascular;  Laterality: N/A;   PACEMAKER REMOVAL  07/2017   TEE WITHOUT CARDIOVERSION N/A 12/26/2021   Procedure: TRANSESOPHAGEAL ECHOCARDIOGRAM (TEE);  Surgeon: [name], MD;  Location: [facility] ENDOSCOPY;  Service: Cardiovascular;  Laterality: N/A;     No family history on file.   Social History   Socioeconomic History   Marital status: Married    Spouse name: Not on file   Number of children: Not on file   Years of education: Not on file   Highest education level: Not on file  Occupational History   Not on file  Tobacco Use   Smoking status: Never   Smokeless tobacco: Never  Substance and Sexual Activity   Alcohol use: No    Alcohol/week: 0.0 standard drinks of alcohol   Drug use: No   Sexual activity: Not on file  Other Topics Concern   Not on file  Social History Narrative   Not on file   Social Determinants of Health   Financial Resource  Strain: Not on file  Food Insecurity: Not on file  Transportation Needs: Not on file  Physical Activity: Not on file  Stress: Not on file  Social Connections: Not on file  Intimate Partner Violence: Not on file     BP (!) 150/88   Pulse 76   Ht '5\' 9"'$  (1.753 m)   Wt 209 lb (94.8 kg)   BMI 30.86 kg/m  BP - 146/88 by me Physical Exam:  Well appearing NAD HEENT: Unremarkable Neck:  No JVD, no thyromegally Lymphatics:  No adenopathy Back:  No CVA tenderness Lungs:  Clear HEART:  Regular rate rhythm, no murmurs, no rubs, no clicks Abd:  soft, positive bowel sounds, no organomegally, no rebound, no guarding Ext:  2 plus pulses, no edema, no cyanosis, no clubbing Skin:  No rashes no nodules Neuro:  CN II through XII intact, motor grossly intact  Assess/Plan:   PPM infection - he is s/p infection. He appears to be doing well. I'll see him back as needed. He appears to be well healed. Sinus node dysfunction - this is mostly while he sleeps. He has been asymptomatic.  PAF - He is maintaining NSR. He will continue eliquis.  HTN - his bp has been elevated. He worked all night and did not take his meds in the last 24 hours. He will go home and take his meds.  Carleene Overlie Matheo Rathbone,MD

## 2022-11-06 ENCOUNTER — Other Ambulatory Visit: Payer: Self-pay | Admitting: Interventional Radiology

## 2022-11-06 DIAGNOSIS — N2889 Other specified disorders of kidney and ureter: Secondary | ICD-10-CM

## 2022-11-27 ENCOUNTER — Other Ambulatory Visit: Payer: Self-pay | Admitting: Interventional Radiology

## 2022-11-27 DIAGNOSIS — N2889 Other specified disorders of kidney and ureter: Secondary | ICD-10-CM

## 2023-02-04 ENCOUNTER — Ambulatory Visit
Admission: RE | Admit: 2023-02-04 | Discharge: 2023-02-04 | Disposition: A | Discharge status: Home / Self Care | Payer: Medicare HMO | Source: Ambulatory Visit | Attending: Interventional Radiology | Admitting: Interventional Radiology

## 2023-02-04 DIAGNOSIS — N2889 Other specified disorders of kidney and ureter: Secondary | ICD-10-CM

## 2023-02-04 NOTE — Progress Notes (Signed)
Patient ID: Jason Emerald Kornegay Sr., male   DOB: [birth year], 72 y.o.   MRN: [id number]       Chief Complaint:  Right renal cell carcinoma  Referring Physician(s):  [facility]  History of Present Illness: Jason Rosser Brevik Sr. is a 72 y.o. male Who is 3.5 years status post second CT-guided cryoablation of a right renal cell carcinoma recurrence along a previous ablation site.  Overall he continues to do very well from a urinary tract standpoint.  No flank or abdominal pain.  No hematuria or dysuria.  No recent illness or fevers.  Stable functional status.  Interval surveillance imaging at [facility] from 01/05/2023 confirms a stable ablation defect in the right kidney midpole laterally.  No signs of local recurrence or residual enhancement.  Stable renal cysts.  No additional acute finding in the abdomen.    Past Medical History:  Diagnosis Date   Acute metabolic encephalopathy 12/22/2021   Arm DVT (deep venous thromboembolism), acute (HCC)    Bleeding from wound 01/12/2022   Brachial plexopathy 05/14/2015   Cellulitis of middle toe 02/25/2016   Cervical radiculopathy at C8 05/10/2015   Chronic kidney disease    Cord compression (HCC) 05/10/2015   Coronary artery disease    Deep vein thrombosis (DVT) of distal vein of left lower extremity of indeterminate age [facility]) 12/29/2021   Diabetic ulcer of toe of left foot associated with diabetes mellitus due to underlying condition, with fat layer exposed (HCC) 07/04/2021   DVT of axillary vein, acute right (HCC) 10/25/2017   Empyema of gallbladder 10/24/2017   GIB (gastrointestinal bleeding) 10/20/2017   History of kidney cancer 02/10/2019   Hypercholesterolemia 04/22/2016   Hypertension    Iron deficiency anemia due to chronic blood loss 07/29/2017   Formatting of this note might be different from the original. Added automatically from request for surgery [id number]   Metatarsalgia of left foot 08/21/2015   Metatarsalgia of right foot 07/03/2015    MRSA bacteremia 06/18/2017   MVP (mitral valve prolapse) 06/10/2013   Myocardial infarction [facility])    pt states that he had a "light heart attack about 25 yrs ago"   Pacemaker    Pacemaker pocket hematoma 01/12/2022   Pneumonia    Renal cell carcinoma of right kidney (HCC) 06/09/2019   Right renal mass 03/31/2016   S/P placement of cardiac pacemaker    Scrotal pain 12/22/2021   Severe sepsis (HCC) 12/22/2021   Snoring 08/25/2019   Stroke (HCC)    approx 15 yrs ago    Past Surgical History:  Procedure Laterality Date   BUBBLE STUDY  12/26/2021   Procedure: BUBBLE STUDY;  Surgeon: Parke Poisson, MD;  Location: [facility] ENDOSCOPY;  Service: Cardiovascular;;   IR GENERIC HISTORICAL  04/22/2016   IR RADIOLOGIST EVAL & MGMT 04/22/2016 Berdine Dance, MD [facility] INTERV RAD   IR RADIOLOGIST EVAL & MGMT  05/18/2019   IR RADIOLOGIST EVAL & MGMT  07/06/2019   IR RADIOLOGIST EVAL & MGMT  10/26/2019   IR RADIOLOGIST EVAL & MGMT  10/30/2020   IR RADIOLOGIST EVAL & MGMT  11/01/2021   PACEMAKER IMPLANT  05/2017   PACEMAKER LEAD REMOVAL N/A 12/29/2021   Procedure: PACEMAKER LEAD REMOVAL;  Surgeon: Marinus Maw, MD;  Location[facility] OR;  Service: Cardiovascular;  Laterality: N/A;   PACEMAKER REMOVAL  07/2017   TEE WITHOUT CARDIOVERSION N/A 12/26/2021   Procedure: TRANSESOPHAGEAL ECHOCARDIOGRAM (TEE);  Surgeon: Parke Poisson, MD;  Location: [facility] ENDOSCOPY;  Service: Cardiovascular;  Laterality: N/A;    Allergies: Ativan [lorazepam], Keflex [cephalexin], Ace inhibitors, Prednisone, and Sulfa antibiotics  Medications: Prior to Admission medications   Medication Sig Start Date End Date Taking? Authorizing Provider  amLODipine (NORVASC) 2.5 MG tablet  01/23/16   [provider]  apixaban (ELIQUIS) 5 MG TABS tablet Take 1 tablet (5 mg total) by mouth 2 (two) times daily. 02/19/22   Marinus Maw, MD  atorvastatin (LIPITOR) 40 MG tablet Take 40 mg by mouth daily.    [provider]   carisoprodol (SOMA) 350 MG tablet Take 350 mg by mouth at bedtime as needed for muscle spasms.    [provider]  doxazosin (CARDURA) 2 MG tablet Take 2 mg by mouth at bedtime.    [provider]  furosemide (LASIX) 40 MG tablet Take 40 mg by mouth daily.    [provider]  IFEREX 150 150 MG capsule Take 150 mg by mouth at bedtime. 10/07/21   [provider]  Insulin Aspart Prot & Aspart (NOVOLOG MIX 70/30 [place]) Inject 20-30 Units into the skin every morning. 30 units in the morning, 20 units at bedtime    [provider]  lidocaine (LIDODERM) 5 % Place 1 patch onto the skin daily. Remove & Discard patch within 12 hours or as directed by MD 03/08/22   Garlon Hatchet, PA-C  losartan (COZAAR) 100 MG tablet Take 1 tablet (100 mg total) by mouth daily. 01/14/22   Fayette Pho, MD  methocarbamol (ROBAXIN) 500 MG tablet Take 1 tablet (500 mg total) by mouth 2 (two) times daily. 03/08/22   Garlon Hatchet, PA-C  Multiple Vitamins-Minerals (CENTRUM SILVER ADULT 50+ PO) Take 1 tablet by mouth daily.    [provider]  nitroGLYCERIN (NITROSTAT) 0.4 MG SL tablet Place 0.4 mg under the tongue every 5 (five) minutes as needed for chest pain.    [provider]  oxyCODONE (ROXICODONE) 5 MG immediate release tablet Take 1 tablet (5 mg total) by mouth every 4 (four) hours as needed for severe pain. 04/25/22   Molpus, John, MD  pantoprazole (PROTONIX) 40 MG tablet Take 40 mg by mouth 2 (two) times daily. 11/30/21   [provider]  potassium chloride (K-DUR) 10 MEQ tablet Take 30 mEq by mouth daily.    [provider]  SYMBICORT 160-4.5 MCG/ACT inhaler Inhale 2 puffs into the lungs in the morning and at bedtime. 12/19/21   [provider]  triamcinolone cream (KENALOG) 0.1 % Apply 1 application. topically 2 (two) times daily as needed (itching). 12/14/21   [provider]     No family history on file.  Social History    Socioeconomic History   Marital status: Married    Spouse name: Not on file   Number of children: Not on file   Years of education: Not on file   Highest education level: Not on file  Occupational History   Not on file  Tobacco Use   Smoking status: Never   Smokeless tobacco: Never  Substance and Sexual Activity   Alcohol use: No    Alcohol/week: 0.0 standard drinks of alcohol   Drug use: No   Sexual activity: Not on file  Other Topics Concern   Not on file  Social History Narrative   Not on file   Social Determinants of Health   Financial Resource Strain: Not on file  Food Insecurity: Not on file  Transportation Needs: Not on file  Physical Activity: Not on file  Stress: Not on file  Social Connections: Not on file    ECOG Status: 1 - Symptomatic but completely ambulatory  Review of Systems  Review of Systems: A 12 point ROS discussed and pertinent positives are indicated in the HPI above.  All other systems are negative.    Physical Exam No direct physical exam was performed     Telephone health visit only today to review interval surveillance imaging.  Vital Signs: There were no vitals taken for this visit.  Imaging: No results found.  Labs:  CBC: Recent Labs    02/10/22 1427 03/04/22 1552 03/07/22 1853  WBC 8.4 5.9 6.1  HGB 11.5* 10.8* 11.7*  HCT 37.1* 33.2* 37.0*  PLT 197 164 179    COAGS: No results for input(s): "INR", "APTT" in the last 8760 hours.  BMP: Recent Labs    02/10/22 1427 03/04/22 1552 03/07/22 1853  NA 141 137 140  K 3.8 3.6 3.3*  CL 109 109 107  CO2 26 22 25   GLUCOSE 141* 145* 216*  BUN 13 15 14   CALCIUM 9.0 8.7* 8.8*  CREATININE 0.97 1.07 1.19  GFRNONAA >60 >60 >60    LIVER FUNCTION TESTS: Recent Labs    02/10/22 1427 03/04/22 1552 03/07/22 1853  BILITOT 0.6 0.5 0.6  AST 19 17 18   ALT 21 15 15   ALKPHOS 95 89 91  PROT 6.3* 6.0* 6.0*  ALBUMIN 3.5 3.3* 3.5       Assessment and Plan:  3.5 years  status post second CT-guided cryoablation of a right renal cell carcinoma recurrence along a previous ablation defect in the right midpole.  Surveillance imaging confirms stable ablation defect.  No signs of residual or recurrent disease.  No new renal abnormality.  Imaging reviewed with the patient and his spouse today by telephone.  They have clearness standing of the findings.  All questions addressed.  Plan: Schedule for repeat follow-up CT in 1 year at [facility].     Electronically Signed: Berdine Dance 02/04/2023, 1:20 PM   I spent a total of    25 Minutes in remote  clinical consultation, greater than 50% of which was counseling/coordinating care for This patient status post renal cryoablation.    Visit type: Audio only (telephone). Audio (no video) only due to patient's lack of internet/smartphone capability. Alternative for in-person consultation at [facility], [address], [place], [place].    This format is felt to be most appropriate for this patient at this time.  All issues noted in this document were discussed and addressed.

## 2023-04-25 ENCOUNTER — Emergency Department (HOSPITAL_BASED_OUTPATIENT_CLINIC_OR_DEPARTMENT_OTHER): Payer: Medicare HMO

## 2023-04-25 ENCOUNTER — Emergency Department (HOSPITAL_BASED_OUTPATIENT_CLINIC_OR_DEPARTMENT_OTHER)
Admission: EM | Admit: 2023-04-25 | Discharge: 2023-04-25 | Disposition: A | Discharge status: Home / Self Care | Payer: Medicare HMO | Source: Home / Self Care | Attending: Emergency Medicine | Admitting: Emergency Medicine

## 2023-04-25 ENCOUNTER — Other Ambulatory Visit: Payer: Self-pay

## 2023-04-25 ENCOUNTER — Encounter (HOSPITAL_BASED_OUTPATIENT_CLINIC_OR_DEPARTMENT_OTHER): Payer: Self-pay

## 2023-04-25 DIAGNOSIS — N189 Chronic kidney disease, unspecified: Secondary | ICD-10-CM | POA: Insufficient documentation

## 2023-04-25 DIAGNOSIS — R109 Unspecified abdominal pain: Secondary | ICD-10-CM | POA: Insufficient documentation

## 2023-04-25 DIAGNOSIS — M25551 Pain in right hip: Secondary | ICD-10-CM | POA: Diagnosis present

## 2023-04-25 DIAGNOSIS — I2510 Atherosclerotic heart disease of native coronary artery without angina pectoris: Secondary | ICD-10-CM | POA: Diagnosis not present

## 2023-04-25 DIAGNOSIS — Z794 Long term (current) use of insulin: Secondary | ICD-10-CM | POA: Diagnosis not present

## 2023-04-25 DIAGNOSIS — Z7901 Long term (current) use of anticoagulants: Secondary | ICD-10-CM | POA: Insufficient documentation

## 2023-04-25 DIAGNOSIS — Z79899 Other long term (current) drug therapy: Secondary | ICD-10-CM | POA: Diagnosis not present

## 2023-04-25 LAB — COMPREHENSIVE METABOLIC PANEL
ALT: 15 U/L (ref 0–44)
AST: 18 U/L (ref 15–41)
Albumin: 3.5 g/dL (ref 3.5–5.0)
Alkaline Phosphatase: 90 U/L (ref 38–126)
Anion gap: 9 (ref 5–15)
BUN: 13 mg/dL (ref 8–23)
CO2: 24 mmol/L (ref 22–32)
Calcium: 8.6 mg/dL — ABNORMAL LOW (ref 8.9–10.3)
Chloride: 107 mmol/L (ref 98–111)
Creatinine, Ser: 1.02 mg/dL (ref 0.61–1.24)
GFR, Estimated: >60 mL/min (ref >60)
Glucose, Bld: 171 mg/dL — ABNORMAL HIGH (ref 70–99)
Potassium: 4.2 mmol/L (ref 3.5–5.1)
Sodium: 140 mmol/L (ref 135–145)
Total Bilirubin: 0.5 mg/dL (ref 0.3–1.2)
Total Protein: 6.4 g/dL — ABNORMAL LOW (ref 6.5–8.1)

## 2023-04-25 LAB — URINALYSIS, ROUTINE W REFLEX MICROSCOPIC
Bilirubin Urine: NEGATIVE
Glucose, UA: NEGATIVE mg/dL
Ketones, ur: NEGATIVE mg/dL
Leukocytes,Ua: NEGATIVE
Nitrite: NEGATIVE
Protein, ur: NEGATIVE mg/dL
Specific Gravity, Urine: 1.015 (ref 1.005–1.030)
pH: 7 (ref 5.0–8.0)

## 2023-04-25 LAB — URINALYSIS, MICROSCOPIC (REFLEX)

## 2023-04-25 LAB — CBC
HCT: 35.2 % — ABNORMAL LOW (ref 39.0–52.0)
Hemoglobin: 11.1 g/dL — ABNORMAL LOW (ref 13.0–17.0)
MCH: 26.3 pg (ref 26.0–34.0)
MCHC: 31.5 g/dL (ref 30.0–36.0)
MCV: 83.4 fL (ref 80.0–100.0)
Platelets: 169 10*3/uL (ref 150–400)
RBC: 4.22 MIL/uL (ref 4.22–5.81)
RDW: 14.7 % (ref 11.5–15.5)
WBC: 6.9 10*3/uL (ref 4.0–10.5)
nRBC: 0 % (ref 0.0–0.2)

## 2023-04-25 LAB — LIPASE, BLOOD: Lipase: 27 U/L (ref 11–51)

## 2023-04-25 MED ORDER — IOHEXOL 300 MG/ML  SOLN
100.0000 mL | Freq: Once | INTRAMUSCULAR | Status: AC | PRN
  Administered 2023-04-25: 100 mL via INTRAVENOUS

## 2023-04-25 NOTE — Discharge Instructions (Addendum)
You were seen for your right sided pain in the emergency department.  Your CT scan showed severe arthritis which is probably the most likely cause of your pain.  Please follow-up with your orthopedic surgeon as scheduled.  At home, please take Tylenol and lidocaine patches for your pain.  You can take at 1000 mg of Tylenol every 6 hours as needed for pain.  This is 2 extra strength Tylenol tablets.  Check your MyChart online for the results of any tests that had not resulted by the time you left the emergency department.   Follow-up with your primary doctor in 2-3 days regarding your visit.  Follow-up with the spine team about your pain.  Return immediately to the emergency department if you experience any of the following: Worsening weakness, numbness, bowel or bladder incontinence, or any other concerning symptoms.    Thank you for visiting our Emergency Department. It was a pleasure taking care of you today.

## 2023-04-25 NOTE — ED Provider Notes (Signed)
[place] [facility] Provider Note   CSN: [id number] Arrival date & time: 04/25/23  1125     History  Chief Complaint  Patient presents with   Hip Pain    Yeshaya Fornash Ging Sr. is a 72 y.o. male.  72 year old male with a history of renal cell carcinoma on the right status post ablation, DVT not currently on anticoagulation, implanted cardiac device status post removal due to bacteremia and sepsis, CKD, and CAD who presents to the emergency department with right flank pain.  Patient reports that for the past 3 weeks he has had pain in his right side.  Says that it radiates from his right lower quadrant and right hip and right flank down his right leg.  Not worsened with any movements.  No known injuries.  No fevers, nausea, vomiting, diarrhea, numbness or weakness of his legs, bowel or bladder incontinence, or saddle anesthesia.  Cardiac device was removed over a year ago.  Wife is concerned about possible DVT since he is not on anticoagulation.       Home Medications Prior to Admission medications   Medication Sig Start Date End Date Taking? Authorizing Provider  amLODipine (NORVASC) 2.5 MG tablet  01/23/16   [provider]  apixaban (ELIQUIS) 5 MG TABS tablet Take 1 tablet (5 mg total) by mouth 2 (two) times daily. 02/19/22   Marinus Maw, MD  atorvastatin (LIPITOR) 40 MG tablet Take 40 mg by mouth daily.    [provider]  carisoprodol (SOMA) 350 MG tablet Take 350 mg by mouth at bedtime as needed for muscle spasms.    [provider]  doxazosin (CARDURA) 2 MG tablet Take 2 mg by mouth at bedtime.    [provider]  furosemide (LASIX) 40 MG tablet Take 40 mg by mouth daily.    [provider]  IFEREX 150 150 MG capsule Take 150 mg by mouth at bedtime. 10/07/21   [provider]  Insulin Aspart Prot & Aspart (NOVOLOG MIX 70/30 [place]) Inject 20-30 Units into the skin every morning. 30 units in  the morning, 20 units at bedtime    [provider]  lidocaine (LIDODERM) 5 % Place 1 patch onto the skin daily. Remove & Discard patch within 12 hours or as directed by MD 03/08/22   Garlon Hatchet, PA-C  losartan (COZAAR) 100 MG tablet Take 1 tablet (100 mg total) by mouth daily. 01/14/22   Valetta Close, MD  methocarbamol (ROBAXIN) 500 MG tablet Take 1 tablet (500 mg total) by mouth 2 (two) times daily. 03/08/22   Garlon Hatchet, PA-C  Multiple Vitamins-Minerals (CENTRUM SILVER ADULT 50+ PO) Take 1 tablet by mouth daily.    [provider]  nitroGLYCERIN (NITROSTAT) 0.4 MG SL tablet Place 0.4 mg under the tongue every 5 (five) minutes as needed for chest pain.    [provider]  oxyCODONE (ROXICODONE) 5 MG immediate release tablet Take 1 tablet (5 mg total) by mouth every 4 (four) hours as needed for severe pain. 04/25/22   Molpus, John, MD  pantoprazole (PROTONIX) 40 MG tablet Take 40 mg by mouth 2 (two) times daily. 11/30/21   [provider]  potassium chloride (K-DUR) 10 MEQ tablet Take 30 mEq by mouth daily.    [provider]  SYMBICORT 160-4.5 MCG/ACT inhaler Inhale 2 puffs into the lungs in the morning and at bedtime. 12/19/21   [provider]  triamcinolone cream (KENALOG)  0.1 % Apply 1 application. topically 2 (two) times daily as needed (itching). 12/14/21   [provider]      Allergies    Ativan [lorazepam], Keflex [cephalexin], Ace inhibitors, Prednisone, and Sulfa antibiotics    Review of Systems   Review of Systems  Physical Exam Updated Vital Signs BP (!) 179/90 (BP Location: Left Arm)   Pulse 75   Temp 98.7 F (37.1 C) (Oral)   Resp 20   Ht 5\' 9"  (1.753 m)   Wt 97.9 kg   SpO2 97%   BMI 31.87 kg/m  Physical Exam Vitals and nursing note reviewed.  Constitutional:      General: He is not in acute distress.    Appearance: He is well-developed.  HENT:     Head: Normocephalic and atraumatic.     Right  Ear: External ear normal.     Left Ear: External ear normal.     Nose: Nose normal.  Eyes:     Extraocular Movements: Extraocular movements intact.     Conjunctiva/sclera: Conjunctivae normal.     Pupils: Pupils are equal, round, and reactive to light.  Cardiovascular:     Rate and Rhythm: Normal rate and regular rhythm.     Heart sounds: Normal heart sounds.  Pulmonary:     Effort: Pulmonary effort is normal. No respiratory distress.     Breath sounds: Normal breath sounds.  Abdominal:     General: There is no distension.     Palpations: Abdomen is soft. There is no mass.     Tenderness: There is abdominal tenderness (Right mid abdomen). There is no guarding.  Musculoskeletal:     Cervical back: Normal range of motion and neck supple.     Comments: Full range of motion of right hip without any pain.  No effusion noted.  No deformities to the right lower extremity noted.  1+ edema bilaterally of his legs.  No spinal midline TTP in cervical, thoracic, or lumbar spine. No stepoffs noted.   Motor: Muscle bulk and tone are normal. Strength is 5/5 in hip flexion, knee flexion and extension, ankle dorsiflexion and plantar flexion bilaterally. Full strength of great toe dorsiflexion bilaterally.  Sensory: Intact sensation to light touch in L2 though S1 dermatomes bilaterally.   Skin:    General: Skin is warm and dry.  Neurological:     Mental Status: He is alert. Mental status is at baseline.  Psychiatric:        Mood and Affect: Mood normal.        Behavior: Behavior normal.     ED Results / Procedures / Treatments   Labs (all labs ordered are listed, but only abnormal results are displayed) Labs Reviewed  COMPREHENSIVE METABOLIC PANEL - Abnormal; Notable for the following components:      Result Value   Glucose, Bld 171 (*)    Calcium 8.6 (*)    Total Protein 6.4 (*)    All other components within normal limits  CBC - Abnormal; Notable for the following components:    Hemoglobin 11.1 (*)    HCT 35.2 (*)    All other components within normal limits  URINALYSIS, ROUTINE W REFLEX MICROSCOPIC - Abnormal; Notable for the following components:   Hgb urine dipstick TRACE (*)    All other components within normal limits  URINALYSIS, MICROSCOPIC (REFLEX) - Abnormal; Notable for the following components:   Bacteria, UA RARE (*)    All other components within normal limits  LIPASE,  BLOOD    EKG None  Radiology CT ABDOMEN PELVIS W CONTRAST  Result Date: 04/25/2023 CLINICAL DATA:  Right flank pain.  History of cancer. EXAM: CT ABDOMEN AND PELVIS WITH CONTRAST TECHNIQUE: Multidetector CT imaging of the abdomen and pelvis was performed using the standard protocol following bolus administration of intravenous contrast. RADIATION DOSE REDUCTION: This exam was performed according to the departmental dose-optimization program which includes automated exposure control, adjustment of the mA and/or kV according to patient size and/or use of iterative reconstruction technique. CONTRAST:  OMNIPAQUE IOHEXOL 300 MG/ML  SOLN COMPARISON:  CT abdomen and pelvis 03/07/2022 FINDINGS: Lower chest: No acute abnormality. Hepatobiliary: No focal liver abnormality is seen. Status post cholecystectomy. No biliary dilatation. Pancreas: Unremarkable. No pancreatic ductal dilatation or surrounding inflammatory changes. Spleen: Normal in size without focal abnormality. Adrenals/Urinary Tract: Cryoablation changes in the right kidney appears similar to prior study. There is no hydronephrosis or perinephric fluid. There subcentimeter cortical hypodensities in the kidneys which are too small to characterize, likely cysts. The adrenal glands and bladder are within normal limits. Stomach/Bowel: Stomach is within normal limits. Appendix appears normal. No evidence of bowel wall thickening, distention, or inflammatory changes. Vascular/Lymphatic: Aortic atherosclerosis. No enlarged abdominal or pelvic  lymph nodes. Reproductive: Prostate is unremarkable. Other: No abdominal wall hernia or abnormality. No abdominopelvic ascites. Musculoskeletal: Multilevel degenerative changes affect the spine. There also degenerative changes of both hips, severe on the right. Small right hip joint effusion is present. No focal hematoma or fluid collection. IMPRESSION: 1. No acute localizing process in the abdomen or pelvis. 2. Cryoablation changes in the right kidney appears similar to prior study. No evidence for recurrent tumor or metastatic disease. 3. Severe degenerative changes of the right hip with small joint effusion. 4. Aortic atherosclerosis. Aortic Atherosclerosis (ICD10-I70.0). Electronically Signed   By: Darliss Cheney M.D.   On: 04/25/2023 16:40   US Venous Img Lower Unilateral Right  Result Date: 04/25/2023 CLINICAL DATA:  Right lateral hip pain shooting into the groin for 2-3 weeks EXAM: RIGHT LOWER EXTREMITY VENOUS DOPPLER ULTRASOUND TECHNIQUE: Gray-scale sonography with compression, as well as color and duplex ultrasound, were performed to evaluate the deep venous system(s) from the level of the common femoral vein through the popliteal and proximal calf veins. COMPARISON:  None Available. FINDINGS: VENOUS Normal compressibility of the common femoral, superficial femoral, and popliteal veins, as well as the visualized calf veins. Visualized portions of profunda femoral vein and great saphenous vein unremarkable. No filling defects to suggest DVT on grayscale or color Doppler imaging. Doppler waveforms show normal direction of venous flow, normal respiratory plasticity and response to augmentation. Limited views of the contralateral common femoral vein are unremarkable. OTHER None. Limitations: none IMPRESSION: Negative. Electronically Signed   By: Elige Ko M.D.   On: 04/25/2023 15:13   DG Hip Unilat  With Pelvis 2-3 Views Right  Result Date: 04/25/2023 CLINICAL DATA:  Right hip pain radiating to groin.  EXAM: DG HIP (WITH OR WITHOUT PELVIS) 2-3V RIGHT COMPARISON:  None Available. FINDINGS: Moderate bilateral osteoarthritic change right worse than left. No acute fracture or dislocation. Mild degenerative changes of the spine. IMPRESSION: 1. No acute findings. 2. Moderate bilateral osteoarthritic change right worse than left. Electronically Signed   By: Elberta Fortis M.D.   On: 04/25/2023 12:44    Procedures Procedures    Medications Ordered in ED Medications  iohexol (OMNIPAQUE) 300 MG/ML solution 100 mL (100 mLs Intravenous Contrast Given 04/25/23 1551)  ED Course/ Medical Decision Making/ A&P Clinical Course as of 04/25/23 1809  Sat Apr 25, 2023  1521 Received sign out from Dr. Eloise Harman pending CT abdomen, presenting with right hip pain, RLE [place] negative for DVT, XR nip negative. CT to evaluate for possible intraabdominal process as cause of his referred pain.  [WS]  1539 Signed out to Dr Suezanne Jacquet  [RP]  [phone] CT scan with no evidence of any intra-abdominal process.  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  Patient has intact range of motion of the hip and no leukocytosis or fever, extreme low concern for infectious process.  Discussed results with the patient, he reports that he has plans coming up to follow-up with his orthopedic doctor. Will discharge patient to home. All questions answered. Patient comfortable with plan of discharge. Return precautions discussed with patient and specified on the after visit summary.  [WS]    Clinical Course User Index [RP] Rondel Baton, MD [WS] Lonell Grandchild, MD                                 Medical Decision Making Amount and/or Complexity of Data Reviewed Labs: ordered. Radiology: ordered.  Risk Prescription drug management.   Audy Deerwester Resor Sr. is a 72 y.o. male with comorbidities that complicate the patient evaluation including enal cell carcinoma on the right status post ablation, DVT not currently on  anticoagulation, implanted cardiac device status post removal due to bacteremia and sepsis, CKD, and CAD who presents to the emergency department with right flank and hip pain  Initial Ddx:  Lumbar radiculopathy, sciatica, spinal cord compression, renal cell carcinoma, metastases, DVT, AAA, kidney stone  MDM/Course:  Patient presents to the emergency department with right hip pain and right flank pain that radiates down his right leg.  Does have an extensive past medical history.  On exam has some right mid abdominal pain but no step-offs or deformities of his back and is neurovascularly intact in his legs.  Does not have any obvious deformities of the hip or significant tenderness to palpation.  In triage had x-rays that did not show any evidence of fracture.  Given his history of cancer and the fact that he is not currently on treatment did obtain a CT scan of the abdomen and pelvis which is pending at this time.  Did obtain a DVT study of the right lower extremity at his wife's request which was negative.  Upon re-evaluation the patient remained stable.  Signed out to the oncoming provider awaiting CT imaging results.  Suspect that if his imaging is negative that it is likely due to sciatica.  This patient presents to the ED for concern of complaints listed in HPI, this involves an extensive number of treatment options, and is a complaint that carries with it a high risk of complications and morbidity. Disposition including potential need for admission considered.   Dispo: Pending remainder of workup  Additional history obtained from spouse Records reviewed Outpatient Clinic Notes The following labs were independently interpreted: Chemistry and show no acute abnormality I independently reviewed the following imaging with scope of interpretation limited to determining acute life threatening conditions related to emergency care: Extremity x-ray(s) and agree with the radiologist interpretation with  the following exceptions: none I personally reviewed and interpreted cardiac monitoring: normal sinus rhythm  I personally reviewed and interpreted the pt's EKG: see above for interpretation  I  have reviewed the patients home medications and made adjustments as needed Social Determinants of health:  Elderly         Final Clinical Impression(s) / ED Diagnoses Final diagnoses:  Right hip pain    Rx / DC Orders ED Discharge Orders     None         Rondel Baton, MD 04/25/23 [phone]

## 2023-04-25 NOTE — ED Provider Notes (Signed)
  Physical Exam  BP (!) 179/90 (BP Location: Left Arm)   Pulse 75   Temp 98.7 F (37.1 C) (Oral)   Resp 20   Ht 5\' 9"  (1.753 m)   Wt 97.9 kg   SpO2 97%   BMI 31.87 kg/m   Physical Exam Constitutional:      General: He is not in acute distress.    Appearance: Normal appearance.  Musculoskeletal:        General: No tenderness, deformity or signs of injury. Normal range of motion.     Comments: Of right hip   Neurological:     Mental Status: He is alert.      ED Course / MDM   Clinical Course as of 04/25/23 1655  Sat Apr 25, 2023  1521 Received sign out from Dr. Eloise Harman pending CT abdomen, presenting with right hip pain, RLE [place] negative for DVT, XR nip negative. CT to evaluate for possible intraabdominal process as cause of his referred pain.  [WS]  1539 Signed out to Dr Suezanne Jacquet  [RP]  [phone] CT scan with no evidence of any intra-abdominal process.  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  Patient has intact range of motion of the hip and no leukocytosis or fever, extreme low concern for infectious process.  Discussed results with the patient, he reports that he has plans coming up to follow-up with his orthopedic doctor. Will discharge patient to home. All questions answered. Patient comfortable with plan of discharge. Return precautions discussed with patient and specified on the after visit summary.  [WS]    Clinical Course User Index [RP] Rondel Baton, MD [WS] Lonell Grandchild, MD   Medical Decision Making Amount and/or Complexity of Data Reviewed Labs: ordered. Radiology: ordered.  Risk Prescription drug management.          Lonell Grandchild, MD 04/25/23 1655

## 2023-04-25 NOTE — ED Triage Notes (Signed)
The patient has been having right hip pain that moves into his groin. He stated it has gotten much worse and now he is having to use a cane. He stated he he has a hx of blood clots in his arms and legs. No injury noted.

## 2023-06-10 ENCOUNTER — Emergency Department (HOSPITAL_BASED_OUTPATIENT_CLINIC_OR_DEPARTMENT_OTHER)
Admission: EM | Admit: 2023-06-10 | Discharge: 2023-06-10 | Disposition: A | Discharge status: Home / Self Care | Payer: Medicare HMO

## 2023-06-10 ENCOUNTER — Emergency Department (HOSPITAL_BASED_OUTPATIENT_CLINIC_OR_DEPARTMENT_OTHER): Payer: Medicare HMO

## 2023-06-10 ENCOUNTER — Other Ambulatory Visit: Payer: Self-pay

## 2023-06-10 DIAGNOSIS — I10 Essential (primary) hypertension: Secondary | ICD-10-CM | POA: Diagnosis not present

## 2023-06-10 DIAGNOSIS — R600 Localized edema: Secondary | ICD-10-CM | POA: Insufficient documentation

## 2023-06-10 DIAGNOSIS — R509 Fever, unspecified: Secondary | ICD-10-CM | POA: Diagnosis present

## 2023-06-10 DIAGNOSIS — J441 Chronic obstructive pulmonary disease with (acute) exacerbation: Secondary | ICD-10-CM | POA: Diagnosis not present

## 2023-06-10 DIAGNOSIS — U071 COVID-19: Secondary | ICD-10-CM | POA: Diagnosis not present

## 2023-06-10 DIAGNOSIS — Z7901 Long term (current) use of anticoagulants: Secondary | ICD-10-CM | POA: Insufficient documentation

## 2023-06-10 DIAGNOSIS — Z79899 Other long term (current) drug therapy: Secondary | ICD-10-CM | POA: Diagnosis not present

## 2023-06-10 DIAGNOSIS — Z7951 Long term (current) use of inhaled steroids: Secondary | ICD-10-CM | POA: Diagnosis not present

## 2023-06-10 DIAGNOSIS — Z7952 Long term (current) use of systemic steroids: Secondary | ICD-10-CM | POA: Diagnosis not present

## 2023-06-10 LAB — COMPREHENSIVE METABOLIC PANEL
ALT: 25 U/L (ref 0–44)
AST: 32 U/L (ref 15–41)
Albumin: 3.5 g/dL (ref 3.5–5.0)
Alkaline Phosphatase: 87 U/L (ref 38–126)
Anion gap: 16 — ABNORMAL HIGH (ref 5–15)
BUN: 12 mg/dL (ref 8–23)
CO2: 25 mmol/L (ref 22–32)
Calcium: 8.9 mg/dL (ref 8.9–10.3)
Chloride: 96 mmol/L — ABNORMAL LOW (ref 98–111)
Creatinine, Ser: 1.21 mg/dL (ref 0.61–1.24)
GFR, Estimated: >60 mL/min (ref >60)
Glucose, Bld: 137 mg/dL — ABNORMAL HIGH (ref 70–99)
Potassium: 4 mmol/L (ref 3.5–5.1)
Sodium: 137 mmol/L (ref 135–145)
Total Bilirubin: 1.1 mg/dL (ref 0.3–1.2)
Total Protein: 6.5 g/dL (ref 6.5–8.1)

## 2023-06-10 LAB — D-DIMER, QUANTITATIVE: D-Dimer, Quant: 1.17 ug{FEU}/mL — ABNORMAL HIGH (ref 0.00–0.50)

## 2023-06-10 LAB — CBC
HCT: 36.1 % — ABNORMAL LOW (ref 39.0–52.0)
Hemoglobin: 11.4 g/dL — ABNORMAL LOW (ref 13.0–17.0)
MCH: 26.1 pg (ref 26.0–34.0)
MCHC: 31.6 g/dL (ref 30.0–36.0)
MCV: 82.6 fL (ref 80.0–100.0)
Platelets: 152 10*3/uL (ref 150–400)
RBC: 4.37 MIL/uL (ref 4.22–5.81)
RDW: 15.3 % (ref 11.5–15.5)
WBC: 8.6 10*3/uL (ref 4.0–10.5)
nRBC: 0 % (ref 0.0–0.2)

## 2023-06-10 LAB — RESP PANEL BY RT-PCR (RSV, FLU A&B, COVID)  RVPGX2
Influenza A by PCR: NEGATIVE
Influenza B by PCR: NEGATIVE
Resp Syncytial Virus by PCR: NEGATIVE
SARS Coronavirus 2 by RT PCR: POSITIVE — AB

## 2023-06-10 LAB — LIPASE, BLOOD: Lipase: 26 U/L (ref 11–51)

## 2023-06-10 LAB — LACTIC ACID, PLASMA: Lactic Acid, Venous: 1.1 mmol/L (ref 0.5–1.9)

## 2023-06-10 MED ORDER — IPRATROPIUM-ALBUTEROL 0.5-2.5 (3) MG/3ML IN SOLN
3.0000 mL | Freq: Once | RESPIRATORY_TRACT | Status: AC
  Administered 2023-06-10: 3 mL via RESPIRATORY_TRACT

## 2023-06-10 MED ORDER — IPRATROPIUM-ALBUTEROL 0.5-2.5 (3) MG/3ML IN SOLN
RESPIRATORY_TRACT | Status: AC
  Administered 2023-06-10: 3 mL
  Filled 2023-06-10: qty 6

## 2023-06-10 MED ORDER — ONDANSETRON HCL 4 MG PO TABS: 4.0000 mg | ORAL_TABLET | Freq: Four times a day (QID) | ORAL | 0 refills | Status: DC

## 2023-06-10 MED ORDER — METHYLPREDNISOLONE SODIUM SUCC 125 MG IJ SOLR
125.0000 mg | Freq: Once | INTRAMUSCULAR | Status: AC
  Administered 2023-06-10: 125 mg via INTRAVENOUS
  Filled 2023-06-10: qty 2

## 2023-06-10 MED ORDER — ONDANSETRON HCL 4 MG/2ML IJ SOLN
4.0000 mg | Freq: Once | INTRAMUSCULAR | Status: AC
  Administered 2023-06-10: 4 mg via INTRAVENOUS
  Filled 2023-06-10: qty 2

## 2023-06-10 MED ORDER — SODIUM CHLORIDE 0.9 % IV BOLUS
1000.0000 mL | Freq: Once | INTRAVENOUS | Status: AC
  Administered 2023-06-10: 1000 mL via INTRAVENOUS

## 2023-06-10 MED ORDER — HYDRALAZINE HCL 20 MG/ML IJ SOLN
10.0000 mg | Freq: Once | INTRAMUSCULAR | Status: DC
  Filled 2023-06-10: qty 1

## 2023-06-10 MED ORDER — IOHEXOL 350 MG/ML SOLN
100.0000 mL | Freq: Once | INTRAVENOUS | Status: AC | PRN
  Administered 2023-06-10: 100 mL via INTRAVENOUS

## 2023-06-10 MED ORDER — ALBUTEROL SULFATE (2.5 MG/3ML) 0.083% IN NEBU
2.5000 mg | INHALATION_SOLUTION | Freq: Once | RESPIRATORY_TRACT | Status: AC
  Administered 2023-06-10: 2.5 mg via RESPIRATORY_TRACT
  Filled 2023-06-10: qty 3

## 2023-06-10 MED ORDER — IOHEXOL 300 MG/ML  SOLN
100.0000 mL | Freq: Once | INTRAMUSCULAR | Status: AC | PRN
  Administered 2023-06-10: 100 mL via INTRAVENOUS

## 2023-06-10 NOTE — Discharge Instructions (Signed)
Please follow-up with your primary doctor.  You should quarantine for 3 days or until you are fever free for 24 hours.  As discussed please take your inhaler every 4 hours for the next 24 hours then every 6 hours for 24 hours and then as needed.  We have prescribed you Zofran for nausea.  Please take as needed.  Return immediately 12 fevers, chills, chest pain, worsening shortness of breath, lightheadedness, passout, inability eat or drink due to nausea and vomiting or you develop any new or worsening symptoms.

## 2023-06-10 NOTE — ED Triage Notes (Signed)
C/O fever, shortness of breath with NV since yesterday.

## 2023-06-10 NOTE — ED Notes (Signed)
RT ambulated pt with pulse ox. Pt's O2 saturation was 94-95% during ambulation.

## 2023-06-10 NOTE — ED Notes (Signed)
With CT 

## 2023-06-10 NOTE — ED Provider Notes (Signed)
[place] [facility] Provider Note   CSN: [id number] Arrival date & time: 06/10/23  1442     History  Chief Complaint  Patient presents with   Fever   Shortness of Breath    Jason Nanna Warnke Sr. is a 72 y.o. male.  See ED course for HPI   Fever Shortness of Breath Associated symptoms: fever        Home Medications Prior to Admission medications   Medication Sig Start Date End Date Taking? Authorizing Provider  amLODipine (NORVASC) 2.5 MG tablet  01/23/16   [provider]  apixaban (ELIQUIS) 5 MG TABS tablet Take 1 tablet (5 mg total) by mouth 2 (two) times daily. 02/19/22   Marinus Maw, MD  atorvastatin (LIPITOR) 40 MG tablet Take 40 mg by mouth daily.    [provider]  carisoprodol (SOMA) 350 MG tablet Take 350 mg by mouth at bedtime as needed for muscle spasms.    [provider]  doxazosin (CARDURA) 2 MG tablet Take 2 mg by mouth at bedtime.    [provider]  furosemide (LASIX) 40 MG tablet Take 40 mg by mouth daily.    [provider]  IFEREX 150 150 MG capsule Take 150 mg by mouth at bedtime. 10/07/21   [provider]  Insulin Aspart Prot & Aspart (NOVOLOG MIX 70/30 [place]) Inject 20-30 Units into the skin every morning. 30 units in the morning, 20 units at bedtime    [provider]  lidocaine (LIDODERM) 5 % Place 1 patch onto the skin daily. Remove & Discard patch within 12 hours or as directed by MD 03/08/22   Garlon Hatchet, PA-C  losartan (COZAAR) 100 MG tablet Take 1 tablet (100 mg total) by mouth daily. 01/14/22   Valetta Close, MD  methocarbamol (ROBAXIN) 500 MG tablet Take 1 tablet (500 mg total) by mouth 2 (two) times daily. 03/08/22   Garlon Hatchet, PA-C  Multiple Vitamins-Minerals (CENTRUM SILVER ADULT 50+ PO) Take 1 tablet by mouth daily.    [provider]  nitroGLYCERIN (NITROSTAT) 0.4 MG SL tablet Place 0.4 mg under the tongue every 5 (five)  minutes as needed for chest pain.    [provider]  oxyCODONE (ROXICODONE) 5 MG immediate release tablet Take 1 tablet (5 mg total) by mouth every 4 (four) hours as needed for severe pain. 04/25/22   Molpus, John, MD  pantoprazole (PROTONIX) 40 MG tablet Take 40 mg by mouth 2 (two) times daily. 11/30/21   [provider]  potassium chloride (K-DUR) 10 MEQ tablet Take 30 mEq by mouth daily.    [provider]  SYMBICORT 160-4.5 MCG/ACT inhaler Inhale 2 puffs into the lungs in the morning and at bedtime. 12/19/21   [provider]  triamcinolone cream (KENALOG) 0.1 % Apply 1 application. topically 2 (two) times daily as needed (itching). 12/14/21   [provider]      Allergies    Ativan [lorazepam], Keflex [cephalexin], Ace inhibitors, Prednisone, and Sulfa antibiotics    Review of Systems   Review of Systems  Constitutional:  Positive for fever.  Respiratory:  Positive for shortness of breath.     Physical Exam Updated Vital Signs BP (!) 144/68   Pulse 80   Temp 98.5 F (36.9 C) (Oral)   Resp 20   Ht 5\' 9"  (1.753 m)   Wt 98.9 kg   SpO2 93%   BMI 32.19 kg/m  Physical Exam Vitals and nursing note reviewed.  Cardiovascular:     Rate and Rhythm: Normal rate and regular rhythm.  Pulmonary:     Effort: Tachypnea present. No respiratory distress.     Breath sounds: Wheezing present.  Chest:     Chest wall: No tenderness.  Musculoskeletal:     Cervical back: Normal range of motion.     Right lower leg: Edema present.     Left lower leg: Edema present.  Skin:    General: Skin is warm.  Neurological:     General: No focal deficit present.     Mental Status: He is alert.  Psychiatric:        Mood and Affect: Mood normal.        Behavior: Behavior normal.     ED Results / Procedures / Treatments   Labs (all labs ordered are listed, but only abnormal results are displayed) Labs Reviewed  RESP PANEL BY RT-PCR (RSV, FLU A&B, COVID)   RVPGX2 - Abnormal; Notable for the following components:      Result Value   SARS Coronavirus 2 by RT PCR POSITIVE (*)    All other components within normal limits  CBC - Abnormal; Notable for the following components:   Hemoglobin 11.4 (*)    HCT 36.1 (*)    All other components within normal limits  COMPREHENSIVE METABOLIC PANEL - Abnormal; Notable for the following components:   Chloride 96 (*)    Glucose, Bld 137 (*)    Anion gap 16 (*)    All other components within normal limits  D-DIMER, QUANTITATIVE - Abnormal; Notable for the following components:   D-Dimer, Quant 1.17 (*)    All other components within normal limits  LIPASE, BLOOD  LACTIC ACID, PLASMA  URINALYSIS, ROUTINE W REFLEX MICROSCOPIC    EKG EKG Interpretation Date/Time:  Wednesday June 10 2023 14:55:32 EDT Ventricular Rate:  81 PR Interval:  199 QRS Duration:  151 QT Interval:  369 QTC Calculation: 429 R Axis:   -72  Text Interpretation: Sinus rhythm RBBB and LAFB Confirmed by Vonita Moss [phone]) on 06/10/2023 3:13:20 PM  Radiology CT Angio Chest PE W and/or Wo Contrast  Result Date: 06/10/2023 CLINICAL DATA:  Positive D-dimer and COVID-19 positivity with shortness of breath, initial encounter EXAM: CT ANGIOGRAPHY CHEST WITH CONTRAST TECHNIQUE: Multidetector CT imaging of the chest was performed using the standard protocol during bolus administration of intravenous contrast. Multiplanar CT image reconstructions and MIPs were obtained to evaluate the vascular anatomy. RADIATION DOSE REDUCTION: This exam was performed according to the departmental dose-optimization program which includes automated exposure control, adjustment of the mA and/or kV according to patient size and/or use of iterative reconstruction technique. CONTRAST:  OMNIPAQUE IOHEXOL 350 MG/ML SOLN COMPARISON:  Chest x-ray from earlier in the same day. FINDINGS: Cardiovascular: Atherosclerotic calcifications of the thoracic aorta are  noted. No aneurysmal dilatation or dissection is noted. No cardiac enlargement is seen. The coronary arteries demonstrate scattered atherosclerotic calcifications. Pulmonary artery shows a normal branching pattern bilaterally. No filling defect to suggest pulmonary embolism is noted. Mediastinum/Nodes: Thoracic inlet is within normal limits. No hilar or mediastinal adenopathy is noted. The esophagus as visualized is within normal limits. Lungs/Pleura: Mild bibasilar atelectatic changes are noted. No focal infiltrate or effusion is seen. Upper Abdomen: As reported on concomitant CT of the abdomen and pelvis. Musculoskeletal: Degenerative changes of the thoracic spine are noted. Review of the MIP images confirms the above findings. IMPRESSION: No evidence  of pulmonary emboli. Mild bibasilar atelectasis. Electronically Signed   By: Alcide Clever M.D.   On: 06/10/2023 20:37   CT ABDOMEN PELVIS W CONTRAST  Result Date: 06/10/2023 CLINICAL DATA:  Abdominal pain, acute, nonlocalized EXAM: CT ABDOMEN AND PELVIS WITH CONTRAST TECHNIQUE: Multidetector CT imaging of the abdomen and pelvis was performed using the standard protocol following bolus administration of intravenous contrast. RADIATION DOSE REDUCTION: This exam was performed according to the departmental dose-optimization program which includes automated exposure control, adjustment of the mA and/or kV according to patient size and/or use of iterative reconstruction technique. CONTRAST:  OMNIPAQUE IOHEXOL 300 MG/ML  SOLN COMPARISON:  CT scan abdomen and pelvis from 04/25/2023. FINDINGS: Lower chest: There are patchy atelectatic changes in the visualized lung bases. No overt consolidation. No pleural effusion. The heart is normal in size. No pericardial effusion. Hepatobiliary: The liver is normal in size. Non-cirrhotic configuration. No suspicious mass. No intrahepatic bile duct dilation. There is mild prominence of the extrahepatic bile duct, most likely due  to post cholecystectomy status. Gallbladder is surgically absent. Pancreas: Unremarkable. No pancreatic ductal dilatation or surrounding inflammatory changes. Spleen: Within normal limits. No focal lesion. Adrenals/Urinary Tract: Adrenal glands are unremarkable. Bilateral kidneys exhibit mild diffuse cortical atrophy. There is a 1.4 x 1.5 cm simple cyst in the left kidney interpolar region, anteriorly. There are multiple additional subcentimeter hypoattenuating structures throughout bilateral kidneys, which are too small to adequately characterize. Redemonstration of partially exophytic hypoattenuating/macroscopic fat containing area in the right kidney interpolar region, laterally, corresponding to post cryoablation changes, unchanged since prior studies. Unremarkable urinary bladder. Stomach/Bowel: No disproportionate dilation of the small or large bowel loops. No evidence of abnormal bowel wall thickening or inflammatory changes. The appendix is unremarkable. Vascular/Lymphatic: No ascites or pneumoperitoneum. No abdominal or pelvic lymphadenopathy, by size criteria. No aneurysmal dilation of the major abdominal arteries. There are moderate peripheral atherosclerotic vascular calcifications of the aorta and its major branches. Reproductive: Normal size prostate. Symmetric seminal vesicles. Other: There is a tiny fat containing umbilical hernia. The soft tissues and abdominal wall are otherwise unremarkable. Musculoskeletal: No suspicious osseous lesions. There are mild - moderate multilevel degenerative changes in the visualized spine. IMPRESSION: 1. No acute inflammatory process identified within the abdomen or pelvis. 2. Stable post cryoablation changes in the right kidney interpolar region, laterally. 3. Multiple other nonacute observations, as described above. Aortic Atherosclerosis (ICD10-I70.0). Electronically Signed   By: Jules Schick M.D.   On: 06/10/2023 17:09   DG Chest Portable 1 View  Result  Date: 06/10/2023 CLINICAL DATA:  Fever, shortness of breath EXAM: PORTABLE CHEST 1 VIEW COMPARISON:  Chest radiograph 03/07/2022 FINDINGS: The cardiomediastinal silhouette is stable with unchanged mild cardiomegaly. There is no focal consolidation or pulmonary edema. There is no pleural effusion or pneumothorax There is no acute osseous abnormality. IMPRESSION: Unchanged mild cardiomegaly. No radiographic evidence of acute cardiopulmonary process. Electronically Signed   By: Lesia Hausen M.D.   On: 06/10/2023 16:12    Procedures Procedures    Medications Ordered in ED Medications  hydrALAZINE (APRESOLINE) injection 10 mg (0 mg Intravenous Hold 06/10/23 1610)  ipratropium-albuterol (DUONEB) 0.5-2.5 (3) MG/3ML nebulizer solution (3 mLs  Given 06/10/23 1501)  ipratropium-albuterol (DUONEB) 0.5-2.5 (3) MG/3ML nebulizer solution 3 mL (3 mLs Nebulization Given 06/10/23 1506)  albuterol (PROVENTIL) (2.5 MG/3ML) 0.083% nebulizer solution 2.5 mg (2.5 mg Nebulization Given 06/10/23 1551)  methylPREDNISolone sodium succinate (SOLU-MEDROL) 125 mg/2 mL injection 125 mg (125 mg Intravenous Given 06/10/23 1606)  ondansetron (  ZOFRAN) injection 4 mg (4 mg Intravenous Given 06/10/23 1838)  iohexol (OMNIPAQUE) 300 MG/ML solution 100 mL (100 mLs Intravenous Contrast Given 06/10/23 1637)  iohexol (OMNIPAQUE) 350 MG/ML injection 100 mL (100 mLs Intravenous Contrast Given 06/10/23 1940)  sodium chloride 0.9 % bolus 1,000 mL (1,000 mLs Intravenous New Bag/Given 06/10/23 2026)    ED Course/ Medical Decision Making/ A&P Clinical Course as of 06/10/23 2121  Wed Jun 10, 2023  9515 72 year old male present emergency department with abdominal pain nausea vomiting and shortness of breath.  Patient began having symptoms last night with generalized nausea.  Noted pain across the center of her abdomen and had several episodes of vomiting.  Progressively developed shortness of breath.  Wife at bedside reports never been told has COPD,  but has seen documents noting such.  He does have rescue inhaler at home.  Respiratory therapy started DuoNebs secondary to diffuse expiratory wheezing in upper lung fields with poor air movement in lower lung fields.  Patient reports roughly 50% improvement after DuoNebs x 2.  Normal bowel movement yesterday.  No rhinorrhea, congestion or cough.  Denies chest pain. [TY]  1518 Per chart review apparently has sick sinus syndrome status post removal of pacemaker secondary to infection. [TY]  1519 Given patient's age and multiple risk factors will pursue laboratory testing in addition to the chest x-ray and respiratory panel ordered by triage.   [TY]  1559 Patient's blood pressure elevated in 180s systolic.  Given his nausea vomiting will give IV antihypertensives.  Seemingly improving with DuoNeb's which suggest COPD component; will give solumedrol.  Chest x-ray independently reviewed by myself no overt pneumonia. [TY]  1704 DG Chest Portable 1 View IMPRESSION: Unchanged mild cardiomegaly. No radiographic evidence of acute cardiopulmonary process.   [TY]  1725 CT ABDOMEN PELVIS W CONTRAST IMPRESSION: 1. No acute inflammatory process identified within the abdomen or pelvis. 2. Stable post cryoablation changes in the right kidney interpolar region, laterally. 3. Multiple other nonacute observations, as described above.  Aortic Atherosclerosis (ICD10-I70.0).   [TY]  1810 Reevaluated reports breathing is improving.  Still with slightly tachypneic with some accessory muscle use.  Lungs with decreased wheezing.  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  Given his tachycardia and complaint of shortness of breath will get D-dimer to exlude PE  [TY]  1821 SARS Coronavirus 2 by RT PCR(!): POSITIVE [TY]  1924 D-Dimer, Quant(!): 1.17 Patient does have history of multiple blood clots.  Stopped taking Eliquis secondary to cost.  Patient's D-dimer could  be elevated secondary to COVID and age.  Shared decision making regarding repeat CT scan; patient would like to proceed with CTA.  Pending radiology approval for repeat dose of contrast. [TY]    Clinical Course User Index [TY] Coral Spikes, DO                                 Medical Decision Making See ED course for further MDM; 72 year old arrives with abdominal pain nausea vomiting shortness of breath.  Afebrile, hypertensive.  Tachypneic with diffuse wheezing.  Maintaining oxygen saturation on room air, but with mild respiratory distress.  He is speaking in full sentences.  Received breathing treatments with improvement of his shortness of breath.  Ambulated and maintain his pulse ox at 9192%.  He feels well.  D-dimer was elevated, however CTA negative for acute PE.  Likely  elevated in the setting of age/COVID.  Patient would like to go home.  Feel this is reasonable.  Given strict return precautions.  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 Decision-making details documented in ED Course.    Details: See ED course Radiology: ordered. Decision-making details documented in ED Course.  Risk Prescription drug management. Diagnosis or treatment significantly limited by social determinants of health. Risk Details: Could not afford Eliquis.         Final Clinical Impression(s) / ED Diagnoses Final diagnoses:  None    Rx / DC Orders ED Discharge Orders     None         Coral Spikes, DO 06/10/23 2121

## 2023-09-19 ENCOUNTER — Other Ambulatory Visit: Payer: Self-pay

## 2023-09-19 ENCOUNTER — Emergency Department (HOSPITAL_BASED_OUTPATIENT_CLINIC_OR_DEPARTMENT_OTHER)
Admission: EM | Admit: 2023-09-19 | Discharge: 2023-09-19 | Disposition: A | Discharge status: Home / Self Care | Payer: Medicare HMO | Attending: Emergency Medicine | Admitting: Emergency Medicine

## 2023-09-19 ENCOUNTER — Emergency Department (HOSPITAL_BASED_OUTPATIENT_CLINIC_OR_DEPARTMENT_OTHER): Payer: Medicare HMO

## 2023-09-19 ENCOUNTER — Encounter (HOSPITAL_BASED_OUTPATIENT_CLINIC_OR_DEPARTMENT_OTHER): Payer: Self-pay | Admitting: Emergency Medicine

## 2023-09-19 DIAGNOSIS — Z794 Long term (current) use of insulin: Secondary | ICD-10-CM | POA: Insufficient documentation

## 2023-09-19 DIAGNOSIS — Z85528 Personal history of other malignant neoplasm of kidney: Secondary | ICD-10-CM | POA: Insufficient documentation

## 2023-09-19 DIAGNOSIS — M25551 Pain in right hip: Secondary | ICD-10-CM | POA: Diagnosis not present

## 2023-09-19 DIAGNOSIS — R1031 Right lower quadrant pain: Secondary | ICD-10-CM | POA: Insufficient documentation

## 2023-09-19 MED ORDER — SENNOSIDES-DOCUSATE SODIUM 8.6-50 MG PO TABS
1.0000 | ORAL_TABLET | Freq: Every evening | ORAL | 0 refills | Status: DC | PRN
Start: 2023-09-19 — End: 2024-02-21

## 2023-09-19 MED ORDER — OXYCODONE HCL 5 MG PO TABS
5.0000 mg | ORAL_TABLET | Freq: Three times a day (TID) | ORAL | 0 refills | Status: DC | PRN
Start: 2023-09-19 — End: 2024-02-21

## 2023-09-19 MED ORDER — OXYCODONE-ACETAMINOPHEN 5-325 MG PO TABS
1.0000 | ORAL_TABLET | Freq: Once | ORAL | Status: AC
  Administered 2023-09-19: 1 via ORAL
  Filled 2023-09-19: qty 1

## 2023-09-19 NOTE — ED Notes (Signed)

## 2023-09-19 NOTE — ED Provider Notes (Signed)
 [place] [facility]MENT AT [facility] Provider Note   CSN: [id number] Arrival date & time: 09/19/23  1514     History  Chief Complaint  Patient presents with   Groin Pain    Jason ORN Oran Sr. is a 73 y.o. male with history of renal cell carcinoma status post ablation, presented to the ED with right inguinal scrotal pain.  Patient reports he has noted this for the past 2 months.  He says it is worse with walking and with any heavy lifting.  He reports specifically pain in his right inguinal region that radiates into his right testicle, sometimes down the right side of his leg on the anterior aspect.  He says his orthopedic doctor wants him to have a right hip replacement, but feels this is different issue.  He denies known history of hernia, nausea, vomiting, constipation or diarrhea.  He denies dysuria or hematuria.  He says he only has Tylenol  at home for pain and is not helping this pain.  He says that his pain is somewhat relieved when he seems to elevate his scrotum.  HPI     Home Medications Prior to Admission medications   Medication Sig Start Date End Date Taking? Authorizing Provider  oxyCODONE  (ROXICODONE ) 5 MG immediate release tablet Take 1 tablet (5 mg total) by mouth every 8 (eight) hours as needed for up to 15 doses for severe pain (pain score 7-10). 09/19/23  Yes Cottie Donnice PARAS, MD  senna-docusate (SENOKOT-S) 8.6-50 MG tablet Take 1 tablet by mouth at bedtime as needed for up to 30 doses for mild constipation. Take as needed while taking oxycodone  (opioids) to prevent constipation 09/19/23  Yes Liliann File, Donnice PARAS, MD  amLODipine  (NORVASC ) 2.5 MG tablet  01/23/16   [provider]  apixaban  (ELIQUIS ) 5 MG TABS tablet Take 1 tablet (5 mg total) by mouth 2 (two) times daily. 02/19/22   Waddell Danelle ORN, MD  atorvastatin  (LIPITOR) 40 MG tablet Take 40 mg by mouth daily.    [provider]  carisoprodol (SOMA) 350 MG tablet Take 350 mg by  mouth at bedtime as needed for muscle spasms.    [provider]  doxazosin  (CARDURA ) 2 MG tablet Take 2 mg by mouth at bedtime.    [provider]  furosemide  (LASIX ) 40 MG tablet Take 40 mg by mouth daily.    [provider]  IFEREX 150 150 MG capsule Take 150 mg by mouth at bedtime. 10/07/21   [provider]  Insulin  Aspart Prot & Aspart (NOVOLOG  MIX 70/30 ) Inject 20-30 Units into the skin every morning. 30 units in the morning, 20 units at bedtime    [provider]  lidocaine  (LIDODERM ) 5 % Place 1 patch onto the skin daily. Remove & Discard patch within 12 hours or as directed by MD 03/08/22   Jarold Olam HERO, PA-C  losartan  (COZAAR ) 100 MG tablet Take 1 tablet (100 mg total) by mouth daily. 01/14/22   Macario Dorothyann HERO, MD  methocarbamol  (ROBAXIN ) 500 MG tablet Take 1 tablet (500 mg total) by mouth 2 (two) times daily. 03/08/22   Jarold Olam HERO, PA-C  Multiple Vitamins-Minerals (CENTRUM SILVER ADULT 50+ PO) Take 1 tablet by mouth daily.    [provider]  nitroGLYCERIN  (NITROSTAT ) 0.4 MG SL tablet Place 0.4 mg under the tongue every 5 (five) minutes as needed for chest pain.    [provider]  ondansetron  (ZOFRAN ) 4 MG tablet Take 1 tablet (  4 mg total) by mouth every 6 (six) hours. 06/10/23   Neysa Caron PARAS, DO  oxyCODONE  (ROXICODONE ) 5 MG immediate release tablet Take 1 tablet (5 mg total) by mouth every 4 (four) hours as needed for severe pain. 04/25/22   Molpus, John, MD  pantoprazole  (PROTONIX ) 40 MG tablet Take 40 mg by mouth 2 (two) times daily. 11/30/21   [provider]  potassium chloride  (K-DUR) 10 MEQ tablet Take 30 mEq by mouth daily.    [provider]  SYMBICORT 160-4.5 MCG/ACT inhaler Inhale 2 puffs into the lungs in the morning and at bedtime. 12/19/21   [provider]  triamcinolone cream (KENALOG) 0.1 % Apply 1 application. topically 2 (two) times daily as needed (itching). 12/14/21    [provider]      Allergies    Ativan  [lorazepam ], Keflex [cephalexin], Ace inhibitors, Prednisone , and Sulfa antibiotics    Review of Systems   Review of Systems  Physical Exam Updated Vital Signs BP (!) 169/94   Pulse 69   Temp 98.1 F (36.7 C)   Resp 16   Ht 5' 9 (1.753 m)   Wt 98.9 kg   SpO2 98%   BMI 32.20 kg/m  Physical Exam Constitutional:      General: He is not in acute distress. HENT:     Head: Normocephalic and atraumatic.  Eyes:     Conjunctiva/sclera: Conjunctivae normal.     Pupils: Pupils are equal, round, and reactive to light.  Cardiovascular:     Rate and Rhythm: Normal rate and regular rhythm.  Pulmonary:     Effort: Pulmonary effort is normal. No respiratory distress.  Abdominal:     General: There is no distension.     Tenderness: There is no abdominal tenderness.  Genitourinary:    Penis: Normal.      Comments: GU exam demonstrates a normal circumcised penis, testicular exam with equal and nonenlarged testes, some mild right epididymal tenderness, normal friend sign, normal cremasteric reflex, no scrotal discoloration or swelling, no palpable inguinal hernia but some discomfort with palpation of the right inguinal lymph nodes. Musculoskeletal:     Comments: Good range of motion of the lower extremities with passive range of motion testing at the bilateral hips, some discomfort with full flexion and extension of the right hip  Skin:    General: Skin is warm and dry.  Neurological:     General: No focal deficit present.     Mental Status: He is alert. Mental status is at baseline.  Psychiatric:        Mood and Affect: Mood normal.        Behavior: Behavior normal.     ED Results / Procedures / Treatments   Labs (all labs ordered are listed, but only abnormal results are displayed) Labs Reviewed - No data to display  EKG None  Radiology CT ABDOMEN PELVIS WO CONTRAST Result Date: 09/19/2023 CLINICAL DATA:  Lymphadenopathy,  groin evaluation for right inguinal hernia/ right inguinal pain Pt c/o LT groin pain x months, but sts it is much worse today; no injury EXAM: CT ABDOMEN AND PELVIS WITHOUT CONTRAST TECHNIQUE: Multidetector CT imaging of the abdomen and pelvis was performed following the standard protocol without IV contrast. RADIATION DOSE REDUCTION: This exam was performed according to the departmental dose-optimization program which includes automated exposure control, adjustment of the mA and/or kV according to patient size and/or use of iterative reconstruction technique. COMPARISON:  CT abdomen pelvis 06/10/2023 trauma CT abdomen  pelvis 12/21/2021 trauma CT 2117 FINDINGS: Lower chest: No acute abnormality.  Tiny hiatal hernia. Hepatobiliary: No focal liver abnormality. Status post cholecystectomy. No biliary dilatation. Pancreas: No focal lesion. Normal pancreatic contour. No surrounding inflammatory changes. No main pancreatic ductal dilatation. Spleen: Normal in size without focal abnormality. Adrenals/Urinary Tract: No adrenal nodule bilaterally. No nephrolithiasis and no hydronephrosis. No definite contour-deforming renal mass. No ureterolithiasis or hydroureter. Punctate calcification associated with left kidney likely vascular. There is a similar-appearing fat containing 1.1 cm right interpolar renal lesion consistent with prior ablation of a known renal cell carcinoma. The urinary bladder is unremarkable. Stomach/Bowel: Stomach is within normal limits. No evidence of bowel wall thickening or dilatation. Appendix appears normal. Vascular/Lymphatic: No abdominal aorta or iliac aneurysm. Severe atherosclerotic plaque of the aorta and its branches. No abdominal, pelvic, or inguinal lymphadenopathy. Reproductive: Prostate is borderline prominent in size-4.5 cm. Other: No intraperitoneal free fluid. No intraperitoneal free gas. No organized fluid collection. Musculoskeletal: No abdominal wall hernia or abnormality. No  suspicious lytic or blastic osseous lesions. No acute displaced fracture. Multilevel degenerative changes of the spine. Moderate severe degenerative changes of the right hip. Mild degenerative changes of the left hip. Densely sclerotic lesion of the right sacrum likely a bone island. IMPRESSION: 1. No acute intra-abdominal intrapelvic abnormality with limited evaluation on this noncontrast study. 2. Prominent prostate. 3. Similar-appearing fat containing 1.1 cm right interpolar renal lesion consistent with prior ablation of a known renal cell carcinoma. Electronically Signed   By: Morgane  Naveau M.D.   On: 09/19/2023 17:25   US  SCROTUM W/DOPPLER Result Date: 09/19/2023 CLINICAL DATA:  402344 Right testicular pain 402344 EXAM: SCROTAL ULTRASOUND DOPPLER ULTRASOUND OF THE TESTICLES TECHNIQUE: Complete ultrasound examination of the testicles, epididymis, and other scrotal structures was performed. Color and spectral Doppler ultrasound were also utilized to evaluate blood flow to the testicles. COMPARISON:  03/04/2022 FINDINGS: Right testicle Measurements: 4.3 x 2.6 x 3.1 cm. No mass visualized. Scattered microlithiasis. Left testicle Measurements: 4.0 x 2.3 x 3.7 cm. No mass visualized. Scattered microlithiasis. Right epididymis: Small epididymal head cyst. Otherwise normal in size and appearance. Left epididymis: Tiny epididymal head cyst. Otherwise normal in size and appearance. Hydrocele:  Small bilateral hydroceles. Varicocele:  None visualized. Pulsed Doppler interrogation of both testes demonstrates normal low resistance arterial and venous waveforms bilaterally. IMPRESSION: 1. No evidence of testicular torsion or intratesticular mass. 2. Small bilateral hydroceles. 3. Bilateral testicular microlithiasis. Current literature suggests that testicular microlithiasis is not a significant independent risk factor for development of testicular carcinoma, and that follow up imaging is not warranted in the absence of  other risk factors. Monthly testicular self-examination and annual physical exams are considered appropriate surveillance. If patient has other risk factors for testicular carcinoma, then referral to Urology should be considered. (Reference: DeCastro, et al.: A 5-Year Follow up Study of Asymptomatic Men with Testicular Microlithiasis. J Urol 2008; 179:1420-1423.) Electronically Signed   By: Mabel Converse D.O.   On: 09/19/2023 16:52    Procedures Procedures    Medications Ordered in ED Medications  oxyCODONE -acetaminophen  (PERCOCET/ROXICET) 5-325 MG per tablet 1 tablet (1 tablet Oral Given 09/19/23 1550)    ED Course/ Medical Decision Making/ A&P                                 Medical Decision Making Amount and/or Complexity of Data Reviewed Radiology: ordered.  Risk OTC drugs. Prescription drug management.  Patient is presenting with right inguinal pain, worsening with standing and movement and exertion for the past 2 months.  Differential would include lymphadenopathy versus hernia versus referred arthritic hip pain versus epididymitis versus other.  Doubt testicular torsion.  External records reviewed including CT abdomen pelvis in October of last year which noted a small umbilical hernia but made no note of inguinal hernia.  Percocet was given for pain here.  CT and ultrasound doppler scrotal imaging ordered and personally reviewed interpreted, notable for no emergent findings to explain the patient's symptoms.  Specifically, no evidence of testicular torsion, epididymitis, or inguinal hernia.  There is no indication for emergent blood testing.  I have a low suspicion for incarcerated hernia, AAA, mesenteric ischemia, quad equina syndrome.  Pain had improved with Percocet.  Based on his presentation I suspect that the patient's symptoms are most likely related to musculoskeletal pain, possibly related to his significant arthritis of the right hip or referred lower back pain.   He will follow-up with the orthopedic doctor for this issue as well as his PCP.  A short-term opioid narcotic prescription was provided for breakthrough pain at home, after discussion with the patient and his wife about the risks of initiating opioids.  I do think he is needing this additional medication for significant discomfort at home.  They are stable for discharge.        Final Clinical Impression(s) / ED Diagnoses Final diagnoses:  Right inguinal pain  Right hip pain    Rx / DC Orders ED Discharge Orders          Ordered    oxyCODONE  (ROXICODONE ) 5 MG immediate release tablet  Every 8 hours PRN        09/19/23 1738    senna-docusate (SENOKOT-S) 8.6-50 MG tablet  At bedtime PRN        09/19/23 1738              Cottie Donnice PARAS, MD 09/19/23 1754

## 2023-09-19 NOTE — Discharge Instructions (Addendum)
 Please follow-up with your primary care doctor or your orthopedic surgeon.  It is possible that your pain is coming from your right hip or your lower back.  He can continue taking Tylenol  regularly for pain, and save oxycodone  for breakthrough pain only.  Oxycodone  is a narcotic.  He should not drive or operate machinery or perform dangerous activities after taking oxycodone .

## 2023-09-19 NOTE — ED Triage Notes (Signed)
 Pt c/o LT groin pain x months, but sts it is much worse today; no injury

## 2023-09-24 ENCOUNTER — Telehealth: Payer: Self-pay | Admitting: *Deleted

## 2023-09-24 NOTE — Telephone Encounter (Signed)
   Pre-operative Risk Assessment    Patient Name: Jason Talley Marcotte Sr.  DOB: 16-May-1951 MRN: [id number]   Date of last office visit: 07-07-22 Date of next office visit: NONE   Request for Surgical Clearance    Procedure:   Right Total Hip Replacement  Date of Surgery:  Clearance TBD                                Surgeon:  Magdalene River Surgeon's Group or Practice Name:  [facility] Phone number:  [phone] Fax number:  [phone]   Type of Clearance Requested:   - Medical    Type of Anesthesia:  Spinal   Additional requests/questions:    SignedRaelyn Number   09/24/2023, 11:54 AM

## 2023-09-25 NOTE — Telephone Encounter (Signed)
   Name: Jason Difranco Zalewski Sr.  DOB: [birth year]  MRN: [id number]  Primary Cardiologist: None  Chart reviewed as part of pre-operative protocol coverage. Because of Lorik Boyea Tonner Sr.'s past medical history and time since last visit, he will require a follow-up in-office visit in order to better assess preoperative cardiovascular risk.  Pre-op covering staff: - Please schedule appointment and call patient to inform them. If patient already had an upcoming appointment within acceptable timeframe, please add "pre-op clearance" to the appointment notes so provider is aware. - Please contact requesting surgeon's office via preferred method (i.e, phone, fax) to inform them of need for appointment prior to surgery.  This message will also be routed to pharmacy pool for input on holding Eliquis as requested below so that this information is available to the clearing provider at time of patient's appointment.   Napoleon Form, Leodis Rains, NP  09/25/2023, 8:06 AM

## 2023-09-25 NOTE — Telephone Encounter (Signed)
Patient sees EP Dr. Ladona Ridgel. Will send message to EP scheduler to call patient for appt

## 2023-09-28 NOTE — Telephone Encounter (Signed)
Spoke w/ patients wife, Faith (on [place]) to schedule appt for pre-op clearance. Patient is scheduled for 2/3 with Canary Brim, NP.

## 2023-09-28 NOTE — Telephone Encounter (Signed)
Pt has appt 10/12/23 with Canary Brim, NP. I will update all parties involved.

## 2023-10-11 NOTE — Progress Notes (Unsigned)
Electrophysiology Office Note:   Date:  10/12/2023  ID:  Jason Emerald Wence Sr., DOB August 27, 1951, MRN [id number]  Primary Cardiologist: None Primary Heart Failure: None Electrophysiologist: None      History of Present Illness:   Jason Difatta Brede Sr. is a 73 y.o. male with h/o SND s/p PPM insertion x2 > s/p extraction 2023 for infection seen today for cardiac clearance for right total hip replacement.    Since last being seen in our clinic the patient reports he really needs to get his hip replaced.  Reports he has a lot of pain and is hopeful it will give him mobility back. After review, he notes he could not afford his Eliquis and has not been on if for more than one year. He did have AF that was identified on device in the past during sepsis events.   He denies chest pain, palpitations, dyspnea, PND, orthopnea, nausea, vomiting, dizziness, syncope, edema, weight gain, or early satiety.  Review of systems complete and found to be negative unless listed in HPI.   EP Information / Studies Reviewed:    EKG is ordered today. Personal review as below.  EKG Interpretation Date/Time:  Monday October 12 2023 13:29:30 EST Ventricular Rate:  51 PR Interval:  222 QRS Duration:  150 QT Interval:  470 QTC Calculation: 433 R Axis:   -56  Text Interpretation: Sinus bradycardia with 1st degree A-V block Right bundle branch block Left anterior fascicular block Bifascicular block PREVIOUS ECG IS PRESENT Confirmed by Canary Brim ([id number]) on 10/12/2023 2:15:22 PM   Studies:  TEE 12/2021 > LVEF 60-65%   Arrhythmia / AAD SND  Paroxysmal AF    Risk Assessment/Calculations:    CHA2DS2-VASc Score = 4   This indicates a 4.8% annual risk of stroke. The patient's score is based upon: CHF History: 0 HTN History: 1 Diabetes History: 1 Stroke History: 0 Vascular Disease History: 1 Age Score: 1 Gender Score: 0    HYPERTENSION CONTROL Vitals:   10/12/23 1331 10/12/23 1348  BP: (!) 174/80 (!)  170/78    The patient's blood pressure is elevated above target today.  In order to address the patient's elevated BP: A current anti-hypertensive medication was adjusted today.           Physical Exam:   VS:  BP (!) 170/78   Pulse (!) 51   Ht 5\' 9"  (1.753 m)   Wt 209 lb 12.8 oz (95.2 kg)   SpO2 97%   BMI 30.98 kg/m    Wt Readings from Last 3 Encounters:  10/12/23 209 lb 12.8 oz (95.2 kg)  09/19/23 218 lb 0.6 oz (98.9 kg)  06/10/23 218 lb (98.9 kg)     GEN: Well nourished, well developed in no acute distress, sitting in wheelchair  NECK: No JVD; No carotid bruits CARDIAC: Regular rate and rhythm, no murmurs, rubs, gallops RESPIRATORY:  Clear to auscultation without rales, wheezing or rhonchi  ABDOMEN: Soft, non-tender, non-distended EXTREMITIES:  No edema; No deformity   ASSESSMENT AND PLAN:    Pre-Operative Clearance  Procedure: Right total hip replacement       Mr. Bryars's perioperative risk of a major cardiac event is 6.6% according to the Revised Cardiac Risk Index (RCRI).  Therefore, he is at high risk for perioperative complications.   His functional capacity is fair at 5.16 METs according to the Duke Activity Status Index (DASI).  Recommendations: According to ACC/AHA guidelines, no further cardiovascular testing needed.  The patient may proceed to  surgery at acceptable risk.     Antiplatelet and/or Anticoagulation Recommendations:  Will send to Pharmacy for [facility] review and recommendations for holding pre-procedure.  Once hold timing of Eliquis given to patient, can move forward with clearance as stated above.     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 Jimmey Ralph in clinic  Secondary Hypercoagulable State  -resume eliquis as above   Hypertension   -increase Norvasc given BP in clinic  -reports med compliance     Follow up with Dr. Jimmey Ralph  in April to discuss Watchman post hip replacement    Signed, Canary Brim, NP-C, AGACNP-BC [facility] - Electrophysiology  10/12/2023, 7:04 PM

## 2023-10-12 ENCOUNTER — Encounter: Payer: Self-pay | Admitting: Pulmonary Disease

## 2023-10-12 ENCOUNTER — Ambulatory Visit: Payer: Medicare HMO | Attending: Pulmonary Disease | Admitting: Pulmonary Disease

## 2023-10-12 VITALS — BP 170/78 | HR 51 | Ht 69.0 in | Wt 209.8 lb

## 2023-10-12 DIAGNOSIS — I480 Paroxysmal atrial fibrillation: Secondary | ICD-10-CM | POA: Diagnosis not present

## 2023-10-12 DIAGNOSIS — Z01818 Encounter for other preprocedural examination: Secondary | ICD-10-CM

## 2023-10-12 DIAGNOSIS — D6869 Other thrombophilia: Secondary | ICD-10-CM

## 2023-10-12 DIAGNOSIS — I495 Sick sinus syndrome: Secondary | ICD-10-CM

## 2023-10-12 MED ORDER — APIXABAN 5 MG PO TABS: 5.0000 mg | ORAL_TABLET | Freq: Two times a day (BID) | ORAL | 0 refills | Status: DC

## 2023-10-12 MED ORDER — APIXABAN 5 MG PO TABS: 5.0000 mg | ORAL_TABLET | Freq: Two times a day (BID) | ORAL | 11 refills | Status: AC

## 2023-10-12 MED ORDER — AMLODIPINE BESYLATE 5 MG PO TABS: 5.0000 mg | ORAL_TABLET | Freq: Every day | ORAL | 3 refills | Status: AC

## 2023-10-12 NOTE — Patient Instructions (Addendum)
Medication Instructions:  1.Increase amlodipine (Norvasc) to 5 mg daily 2.Start eliquis 5 mg twice daily *If you need a refill on your cardiac medications before your next appointment, please call your pharmacy*  Lab Work: None ordered If you have labs (blood work) drawn today and your tests are completely normal, you will receive your results only by: MyChart Message (if you have MyChart) OR A paper copy in the mail If you have any lab test that is abnormal or we need to change your treatment, we will call you to review the results.  Follow-Up: At [facility], you and your health needs are our priority.  As part of our continuing mission to provide you with exceptional heart care, we have created designated Provider Care Teams.  These Care Teams include your primary Cardiologist (physician) and Advanced Practice Providers (APPs -  Physician Assistants and Nurse Practitioners) who all work together to provide you with the care you need, when you need it.  We recommend signing up for the patient portal called "MyChart".  Sign up information is provided on this After Visit Summary.  MyChart is used to connect with patients for Virtual Visits (Telemedicine).  Patients are able to view lab/test results, encounter notes, upcoming appointments, etc.  Non-urgent messages can be sent to your provider as well.   To learn more about what you can do with MyChart, go to [email or web address].    Your next appointment:   April 2025  Provider:   Nobie Putnam, MD only to discuss watchman procedure

## 2023-10-13 NOTE — Telephone Encounter (Signed)
     Primary Cardiologist: None  Chart reviewed as part of pre-operative protocol coverage. Given past medical history and time since last visit, based on ACC/AHA guidelines, Eberardo Demello Legler Sr. would be at acceptable risk for the planned procedure without further cardiovascular testing.   Mr. Villard's perioperative risk of a major cardiac event is 6.6% according to the Revised Cardiac Risk Index (RCRI).  Therefore, he is at high risk for perioperative complications.   His functional capacity is fair at 5.16 METs according to the Duke Activity Status Index (DASI).   Patient with diagnosis of Afib on Eliquis  for anticoagulation.     Procedure:  Right Total Hip Replacement  Date of procedure: TBD     CHA2DS2-VASc Score = 4   This indicates a 4.8% annual risk of stroke. The patient's score is based upon: CHF History: 0 HTN History: 1 Diabetes History: 1 Stroke History: 0 Vascular Disease History: 1 Age Score: 1 Gender Score: 0       CrCl 72 mL/min Platelet count 205 K   Per office protocol, patient can hold Eliquis  for 3 days prior to procedure.  I will route this recommendation to the requesting party via Epic fax function and remove from pre-op  pool.  Please call with questions.  Josefa HERO. Ronya Gilcrest NP-C     10/13/2023, 3:10 PM [facility] [address] Office [phone] Fax [phone]

## 2023-10-13 NOTE — Telephone Encounter (Signed)
 Patient with diagnosis of Afib on Eliquis  for anticoagulation.    Procedure:  Right Total Hip Replacement  Date of procedure: TBD   CHA2DS2-VASc Score = 4   This indicates a 4.8% annual risk of stroke. The patient's score is based upon: CHF History: 0 HTN History: 1 Diabetes History: 1 Stroke History: 0 Vascular Disease History: 1 Age Score: 1 Gender Score: 0     CrCl 72 mL/min Platelet count 205 K  Per office protocol, patient can hold Eliquis  for 3 days prior to procedure.     **This guidance is not considered finalized until pre-operative APP has relayed final recommendations.**

## 2023-10-27 ENCOUNTER — Telehealth: Payer: Self-pay | Admitting: Cardiology

## 2023-10-27 NOTE — Telephone Encounter (Signed)
Tammy from pre-admissions at [facility] called and said that they need the actual EKG faxed to [phone] ATTN: Tammy

## 2023-10-27 NOTE — Telephone Encounter (Signed)
EKG has been faxed.

## 2023-11-01 ENCOUNTER — Emergency Department (HOSPITAL_BASED_OUTPATIENT_CLINIC_OR_DEPARTMENT_OTHER): Payer: Medicare HMO

## 2023-11-01 ENCOUNTER — Encounter (HOSPITAL_BASED_OUTPATIENT_CLINIC_OR_DEPARTMENT_OTHER): Payer: Self-pay | Admitting: Emergency Medicine

## 2023-11-01 ENCOUNTER — Other Ambulatory Visit: Payer: Self-pay

## 2023-11-01 ENCOUNTER — Emergency Department (HOSPITAL_BASED_OUTPATIENT_CLINIC_OR_DEPARTMENT_OTHER)
Admission: EM | Admit: 2023-11-01 | Discharge: 2023-11-01 | Disposition: A | Discharge status: Home / Self Care | Payer: Medicare HMO | Attending: Emergency Medicine | Admitting: Emergency Medicine

## 2023-11-01 DIAGNOSIS — Z794 Long term (current) use of insulin: Secondary | ICD-10-CM | POA: Diagnosis not present

## 2023-11-01 DIAGNOSIS — E119 Type 2 diabetes mellitus without complications: Secondary | ICD-10-CM | POA: Diagnosis not present

## 2023-11-01 DIAGNOSIS — J449 Chronic obstructive pulmonary disease, unspecified: Secondary | ICD-10-CM | POA: Diagnosis not present

## 2023-11-01 DIAGNOSIS — Z7901 Long term (current) use of anticoagulants: Secondary | ICD-10-CM | POA: Diagnosis not present

## 2023-11-01 DIAGNOSIS — Z79899 Other long term (current) drug therapy: Secondary | ICD-10-CM | POA: Insufficient documentation

## 2023-11-01 DIAGNOSIS — J101 Influenza due to other identified influenza virus with other respiratory manifestations: Secondary | ICD-10-CM | POA: Insufficient documentation

## 2023-11-01 DIAGNOSIS — I10 Essential (primary) hypertension: Secondary | ICD-10-CM | POA: Diagnosis not present

## 2023-11-01 DIAGNOSIS — R0602 Shortness of breath: Secondary | ICD-10-CM | POA: Diagnosis present

## 2023-11-01 LAB — CBC WITH DIFFERENTIAL/PLATELET
Abs Immature Granulocytes: 0.02 10*3/uL (ref 0.00–0.07)
Basophils Absolute: 0 10*3/uL (ref 0.0–0.1)
Basophils Relative: 0 %
Eosinophils Absolute: 0.1 10*3/uL (ref 0.0–0.5)
Eosinophils Relative: 3 %
HCT: 35.9 % — ABNORMAL LOW (ref 39.0–52.0)
Hemoglobin: 11.1 g/dL — ABNORMAL LOW (ref 13.0–17.0)
Immature Granulocytes: 0 %
Lymphocytes Relative: 33 %
Lymphs Abs: 1.5 10*3/uL (ref 0.7–4.0)
MCH: 25.2 pg — ABNORMAL LOW (ref 26.0–34.0)
MCHC: 30.9 g/dL (ref 30.0–36.0)
MCV: 81.4 fL (ref 80.0–100.0)
Monocytes Absolute: 0.5 10*3/uL (ref 0.1–1.0)
Monocytes Relative: 10 %
Neutro Abs: 2.4 10*3/uL (ref 1.7–7.7)
Neutrophils Relative %: 54 %
Platelets: 178 10*3/uL (ref 150–400)
RBC: 4.41 MIL/uL (ref 4.22–5.81)
RDW: 15 % (ref 11.5–15.5)
WBC: 4.5 10*3/uL (ref 4.0–10.5)
nRBC: 0 % (ref 0.0–0.2)

## 2023-11-01 LAB — RESP PANEL BY RT-PCR (RSV, FLU A&B, COVID)  RVPGX2
Influenza A by PCR: POSITIVE — AB
Influenza B by PCR: NEGATIVE
Resp Syncytial Virus by PCR: NEGATIVE
SARS Coronavirus 2 by RT PCR: NEGATIVE

## 2023-11-01 LAB — BASIC METABOLIC PANEL
Anion gap: 8 (ref 5–15)
BUN: 17 mg/dL (ref 8–23)
CO2: 23 mmol/L (ref 22–32)
Calcium: 8.4 mg/dL — ABNORMAL LOW (ref 8.9–10.3)
Chloride: 107 mmol/L (ref 98–111)
Creatinine, Ser: 1.25 mg/dL — ABNORMAL HIGH (ref 0.61–1.24)
GFR, Estimated: >60 mL/min (ref >60)
Glucose, Bld: 131 mg/dL — ABNORMAL HIGH (ref 70–99)
Potassium: 3.4 mmol/L — ABNORMAL LOW (ref 3.5–5.1)
Sodium: 138 mmol/L (ref 135–145)

## 2023-11-01 LAB — BRAIN NATRIURETIC PEPTIDE: B Natriuretic Peptide: 39.4 pg/mL (ref 0.0–100.0)

## 2023-11-01 MED ORDER — ALBUTEROL SULFATE (2.5 MG/3ML) 0.083% IN NEBU
2.5000 mg | INHALATION_SOLUTION | Freq: Once | RESPIRATORY_TRACT | Status: AC
  Administered 2023-11-01: 2.5 mg via RESPIRATORY_TRACT
  Filled 2023-11-01: qty 3

## 2023-11-01 MED ORDER — DEXAMETHASONE 6 MG PO TABS: 6.0000 mg | ORAL_TABLET | Freq: Two times a day (BID) | ORAL | 0 refills | Status: DC

## 2023-11-01 MED ORDER — IPRATROPIUM-ALBUTEROL 0.5-2.5 (3) MG/3ML IN SOLN
3.0000 mL | Freq: Once | RESPIRATORY_TRACT | Status: AC
  Administered 2023-11-01: 3 mL via RESPIRATORY_TRACT
  Filled 2023-11-01: qty 3

## 2023-11-01 MED ORDER — ALBUTEROL SULFATE (2.5 MG/3ML) 0.083% IN NEBU
5.0000 mg | INHALATION_SOLUTION | Freq: Once | RESPIRATORY_TRACT | Status: AC
  Administered 2023-11-01: 5 mg via RESPIRATORY_TRACT
  Filled 2023-11-01: qty 6

## 2023-11-01 MED ORDER — DEXAMETHASONE SODIUM PHOSPHATE 10 MG/ML IJ SOLN
10.0000 mg | Freq: Once | INTRAMUSCULAR | Status: AC
  Administered 2023-11-01: 10 mg via INTRAVENOUS
  Filled 2023-11-01: qty 1

## 2023-11-01 NOTE — ED Triage Notes (Signed)
 Per wife pt has had flu-like sx since last Sunday and has been in bed all week. Per wife he has been wheezing and SHOB. Unconfirmed flu but states high rate of exposure at work. Initial oxygen saturation 99% on RA. RR 26. RRT at bedside on arrival.

## 2023-11-01 NOTE — ED Provider Notes (Signed)
 [place] [facility] Provider Note   CSN: [id number] Arrival date & time: 11/01/23  0335     History  Chief Complaint  Patient presents with   Shortness of Breath    Jason Achord Dolder Sr. is a 73 y.o. male.  Patient is a 73 year old male with past medical history of COPD, prior DVT on Eliquis, diabetes, hypertension.  Patient presenting today with complaints of difficulty breathing.  He has been having cough and flulike symptoms throughout the week, then this evening began having wheezing and difficulty breathing.  He denies fevers or chills.  No productive cough.  He has inhalers at home, but did not try this.  The history is provided by the patient.       Home Medications Prior to Admission medications   Medication Sig Start Date End Date Taking? Authorizing Provider  amLODipine (NORVASC) 5 MG tablet Take 1 tablet (5 mg total) by mouth daily. 10/12/23   Jeanella Craze, NP  apixaban (ELIQUIS) 5 MG TABS tablet Take 1 tablet (5 mg total) by mouth 2 (two) times daily. 10/12/23   Jeanella Craze, NP  apixaban (ELIQUIS) 5 MG TABS tablet Take 1 tablet (5 mg total) by mouth 2 (two) times daily. 10/12/23   Jeanella Craze, NP  atorvastatin (LIPITOR) 40 MG tablet Take 40 mg by mouth daily.    [provider]  carisoprodol (SOMA) 350 MG tablet Take 350 mg by mouth at bedtime as needed for muscle spasms.    [provider]  doxazosin (CARDURA) 2 MG tablet Take 2 mg by mouth at bedtime.    [provider]  furosemide (LASIX) 40 MG tablet Take 40 mg by mouth daily.    [provider]  IFEREX 150 150 MG capsule Take 150 mg by mouth at bedtime. 10/07/21   [provider]  Insulin Aspart Prot & Aspart (NOVOLOG MIX 70/30 [place]) Inject 20-30 Units into the skin every morning. 30 units in the morning, 20 units at bedtime    [provider]  lidocaine (LIDODERM) 5 % Place 1 patch onto the skin daily. Remove & Discard  patch within 12 hours or as directed by MD 03/08/22   Garlon Hatchet, PA-C  losartan (COZAAR) 100 MG tablet Take 1 tablet (100 mg total) by mouth daily. 01/14/22   Valetta Close, MD  methocarbamol (ROBAXIN) 500 MG tablet Take 1 tablet (500 mg total) by mouth 2 (two) times daily. 03/08/22   Garlon Hatchet, PA-C  Multiple Vitamins-Minerals (CENTRUM SILVER ADULT 50+ PO) Take 1 tablet by mouth daily.    [provider]  nitroGLYCERIN (NITROSTAT) 0.4 MG SL tablet Place 0.4 mg under the tongue every 5 (five) minutes as needed for chest pain.    [provider]  ondansetron (ZOFRAN) 4 MG tablet Take 1 tablet (4 mg total) by mouth every 6 (six) hours. 06/10/23   Coral Spikes, DO  oxyCODONE (ROXICODONE) 5 MG immediate release tablet Take 1 tablet (5 mg total) by mouth every 4 (four) hours as needed for severe pain. 04/25/22   Molpus, Jonny Ruiz, MD  oxyCODONE (ROXICODONE) 5 MG immediate release tablet Take 1 tablet (5 mg total) by mouth every 8 (eight) hours as needed for up to 15 doses for severe pain (pain score 7-10). 09/19/23   Terald Sleeper, MD  pantoprazole (PROTONIX) 40 MG tablet Take 40 mg by mouth 2 (two) times daily. 11/30/21   [provider]  potassium chloride (K-DUR) 10 MEQ tablet Take 30 mEq by mouth daily.    [provider]  senna-docusate (SENOKOT-S) 8.6-50 MG tablet Take 1 tablet by mouth at bedtime as needed for up to 30 doses for mild constipation. Take as needed while taking oxycodone (opioids) to prevent constipation 09/19/23   Trifan, Kermit Balo, MD  SYMBICORT 160-4.5 MCG/ACT inhaler Inhale 2 puffs into the lungs in the morning and at bedtime. 12/19/21   [provider]  triamcinolone cream (KENALOG) 0.1 % Apply 1 application. topically 2 (two) times daily as needed (itching). 12/14/21   [provider]      Allergies    Ativan [lorazepam], Keflex [cephalexin], Ace inhibitors, Prednisone, and Sulfa antibiotics    Review of Systems    Review of Systems  All other systems reviewed and are negative.   Physical Exam Updated Vital Signs BP (!) 143/66 (BP Location: Right Arm)   Pulse 66   Temp 97.9 F (36.6 C) (Oral)   Resp (!) 26   Ht 5\' 9"  (1.753 m)   Wt 96.6 kg   SpO2 99%   BMI 31.45 kg/m  Physical Exam Vitals and nursing note reviewed.  Constitutional:      General: He is not in acute distress.    Appearance: He is well-developed. He is not diaphoretic.  HENT:     Head: Normocephalic and atraumatic.  Cardiovascular:     Rate and Rhythm: Normal rate and regular rhythm.     Heart sounds: No murmur heard.    No friction rub.  Pulmonary:     Effort: Pulmonary effort is normal. No respiratory distress.     Breath sounds: Examination of the right-middle field reveals rhonchi. Examination of the left-middle field reveals rhonchi. Rhonchi present. No wheezing or rales.  Abdominal:     General: Bowel sounds are normal. There is no distension.     Palpations: Abdomen is soft.     Tenderness: There is no abdominal tenderness.  Musculoskeletal:        General: Normal range of motion.     Cervical back: Normal range of motion and neck supple.  Skin:    General: Skin is warm and dry.  Neurological:     Mental Status: He is alert and oriented to person, place, and time.     Coordination: Coordination normal.     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  Procedures Procedures    Medications Ordered in  ED Medications  dexamethasone (DECADRON) injection 10 mg (has no administration in time range)  ipratropium-albuterol (DUONEB) 0.5-2.5 (3) MG/3ML nebulizer solution 3 mL (3 mLs Nebulization Given 11/01/23 0349)  albuterol (PROVENTIL) (2.5 MG/3ML) 0.083% nebulizer solution 2.5 mg (2.5 mg Nebulization Given 11/01/23 0349)    ED Course/ Medical Decision Making/ A&P  Patient is a 73 year old male with history of COPD presenting with cough and difficulty breathing.  He arrives here afebrile with stable vital signs.  Oxygen saturations are 99% on  room air.  On exam, he does have rhonchorous breath sounds bilaterally, but is in no respiratory distress.  Laboratory studies obtained including CBC, metabolic panel, BNP, all of which are unremarkable.  Respiratory panel positive for influenza A.  Chest x-ray shows no acute process.  Patient has received albuterol and DuoNeb for his breathing along with Solu-Medrol for inflammation.  His breathing has improved and exam also sounds improved.  I feel as though patient can be discharged with steroids, continued use of his inhalers, and return as needed.  Final Clinical Impression(s) / ED Diagnoses Final diagnoses:  None    Rx / DC Orders ED Discharge Orders     None         Geoffery Lyons, MD 11/01/23 [phone]

## 2023-11-01 NOTE — Discharge Instructions (Signed)
 Begin taking Decadron as prescribed.  Continue use of your inhalers as needed for wheezing.  Return to the ER if symptoms significantly worsen or change.

## 2023-12-14 ENCOUNTER — Telehealth: Payer: Self-pay

## 2023-12-14 NOTE — Telephone Encounter (Signed)
 Left message to call back if they wish to reschedule Watchman consult. Wished the patient a speedy recovery.

## 2023-12-14 NOTE — Telephone Encounter (Signed)
-----   Message from [place] sent at 12/11/2023  4:30 PM EDT ----- Regarding: Watchman Discussion [place]!!!   This patient was scheduled for 4/11 with Dr. Jimmey Ralph for a watchman discussion. I just called to reschedule him for earlier in the day due to Dr. Jimmey Ralph needing to get to the hospital. 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  Cassie

## 2023-12-18 ENCOUNTER — Ambulatory Visit: Payer: Medicare HMO | Admitting: Cardiology

## 2024-01-06 ENCOUNTER — Telehealth: Payer: Self-pay

## 2024-01-06 NOTE — Telephone Encounter (Signed)
   Pre-operative Risk Assessment    Patient Name: Jason Kall Eble Sr.  DOB: 1951-06-04 MRN: [id number]   Date of last office visit: 10/12/23 Creighton Doffing, NP Date of next office visit: NONE   Request for Surgical Clearance    Procedure:   EGD/ COLONOSCOPY  Date of Surgery:  Clearance 01/19/24                                Surgeon:  Johnathon Myrtle, MD Surgeon's Group or Practice Name:  [facility], [place] Phone number:  [phone] Fax number:  [phone]   Type of Clearance Requested:   - Medical  - Pharmacy:  Hold Apixaban  (Eliquis ) 2 DAYS PRIOR   Type of Anesthesia:  Not Indicated   Additional requests/questions:    SignedCollin Deal   01/06/2024, 9:24 AM

## 2024-01-11 NOTE — Telephone Encounter (Signed)
 Patient with diagnosis of atrial fibrillation on Eliquis  for anticoagulation.    Procedure:   EGD/ COLONOSCOPY   Date of Surgery:  Clearance 01/19/24     CHA2DS2-VASc Score = 7   This indicates a 11.2% annual risk of stroke. The patient's score is based upon: CHF History: 0 HTN History: 1 Diabetes History: 1 Stroke History: 2 Vascular Disease History: 1 Age Score: 2 Gender Score: 0   Chart indicates stroke was > 20 years ago Does have history of upper extremity DVT 2018  CrCl 67 Platelet count 178  Patient has not had an Afib/aflutter ablation within the last 3 months or DCCV within the last 30 days  Per office protocol, patient can hold Eliquis  for 2 days prior to procedure.   Patient will not need bridging with Lovenox  (enoxaparin ) around procedure.  **This guidance is not considered finalized until pre-operative APP has relayed final recommendations.**

## 2024-01-12 NOTE — Telephone Encounter (Signed)
   Primary Cardiologist: None  Chart reviewed as part of pre-operative protocol coverage. Given past medical history and time since last visit, based on ACC/AHA guidelines, Jason Battersby Scadden Sr. would be at acceptable risk for the planned procedure without further cardiovascular testing.   Patient was advised that if he develops new symptoms prior to surgery to contact our office to arrange a follow-up appointment.  He verbalized understanding.  Per office protocol, patient can hold Eliquis  for 2 days prior to procedure.   Patient will not need bridging with Lovenox  (enoxaparin ) around procedure.  I will route this recommendation to the requesting party via Epic fax function and remove from pre-op  pool.  Please call with questions.  Gerldine Koch, NP-C  01/12/2024, 11:50 AM [address], [address] [place], [place] [address] Office [phone] Fax [phone]

## 2024-02-20 ENCOUNTER — Emergency Department (HOSPITAL_BASED_OUTPATIENT_CLINIC_OR_DEPARTMENT_OTHER)

## 2024-02-20 ENCOUNTER — Observation Stay (HOSPITAL_BASED_OUTPATIENT_CLINIC_OR_DEPARTMENT_OTHER)
Admission: EM | Admit: 2024-02-20 | Discharge: 2024-02-22 | Disposition: A | Discharge status: Home / Self Care | Attending: Obstetrics and Gynecology | Admitting: Obstetrics and Gynecology

## 2024-02-20 ENCOUNTER — Encounter (HOSPITAL_BASED_OUTPATIENT_CLINIC_OR_DEPARTMENT_OTHER): Payer: Self-pay | Admitting: Emergency Medicine

## 2024-02-20 ENCOUNTER — Other Ambulatory Visit: Payer: Self-pay

## 2024-02-20 DIAGNOSIS — R059 Cough, unspecified: Secondary | ICD-10-CM | POA: Diagnosis not present

## 2024-02-20 DIAGNOSIS — E785 Hyperlipidemia, unspecified: Secondary | ICD-10-CM | POA: Insufficient documentation

## 2024-02-20 DIAGNOSIS — Z79899 Other long term (current) drug therapy: Secondary | ICD-10-CM | POA: Diagnosis not present

## 2024-02-20 DIAGNOSIS — Z7901 Long term (current) use of anticoagulants: Secondary | ICD-10-CM | POA: Insufficient documentation

## 2024-02-20 DIAGNOSIS — Z86718 Personal history of other venous thrombosis and embolism: Secondary | ICD-10-CM | POA: Insufficient documentation

## 2024-02-20 DIAGNOSIS — E11621 Type 2 diabetes mellitus with foot ulcer: Secondary | ICD-10-CM | POA: Insufficient documentation

## 2024-02-20 DIAGNOSIS — K219 Gastro-esophageal reflux disease without esophagitis: Secondary | ICD-10-CM | POA: Insufficient documentation

## 2024-02-20 DIAGNOSIS — R0602 Shortness of breath: Secondary | ICD-10-CM | POA: Diagnosis present

## 2024-02-20 DIAGNOSIS — M1711 Unilateral primary osteoarthritis, right knee: Secondary | ICD-10-CM

## 2024-02-20 DIAGNOSIS — Z85828 Personal history of other malignant neoplasm of skin: Secondary | ICD-10-CM | POA: Diagnosis not present

## 2024-02-20 DIAGNOSIS — I480 Paroxysmal atrial fibrillation: Secondary | ICD-10-CM | POA: Diagnosis not present

## 2024-02-20 DIAGNOSIS — R2689 Other abnormalities of gait and mobility: Secondary | ICD-10-CM | POA: Diagnosis not present

## 2024-02-20 DIAGNOSIS — Z794 Long term (current) use of insulin: Secondary | ICD-10-CM | POA: Diagnosis not present

## 2024-02-20 DIAGNOSIS — Z8673 Personal history of transient ischemic attack (TIA), and cerebral infarction without residual deficits: Secondary | ICD-10-CM | POA: Diagnosis not present

## 2024-02-20 DIAGNOSIS — N189 Chronic kidney disease, unspecified: Secondary | ICD-10-CM | POA: Diagnosis not present

## 2024-02-20 DIAGNOSIS — L97529 Non-pressure chronic ulcer of other part of left foot with unspecified severity: Secondary | ICD-10-CM | POA: Diagnosis not present

## 2024-02-20 DIAGNOSIS — Z950 Presence of cardiac pacemaker: Secondary | ICD-10-CM | POA: Insufficient documentation

## 2024-02-20 DIAGNOSIS — Z8679 Personal history of other diseases of the circulatory system: Secondary | ICD-10-CM | POA: Diagnosis not present

## 2024-02-20 DIAGNOSIS — I130 Hypertensive heart and chronic kidney disease with heart failure and stage 1 through stage 4 chronic kidney disease, or unspecified chronic kidney disease: Secondary | ICD-10-CM | POA: Diagnosis not present

## 2024-02-20 DIAGNOSIS — I5033 Acute on chronic diastolic (congestive) heart failure: Principal | ICD-10-CM | POA: Insufficient documentation

## 2024-02-20 DIAGNOSIS — I2510 Atherosclerotic heart disease of native coronary artery without angina pectoris: Secondary | ICD-10-CM | POA: Insufficient documentation

## 2024-02-20 LAB — PRO BRAIN NATRIURETIC PEPTIDE: Pro Brain Natriuretic Peptide: 515 pg/mL — ABNORMAL HIGH (ref <300.0)

## 2024-02-20 LAB — COMPREHENSIVE METABOLIC PANEL WITH GFR
ALT: 11 U/L (ref 0–44)
AST: 20 U/L (ref 15–41)
Albumin: 4.2 g/dL (ref 3.5–5.0)
Alkaline Phosphatase: 131 U/L — ABNORMAL HIGH (ref 38–126)
Anion gap: 12 (ref 5–15)
BUN: 11 mg/dL (ref 8–23)
CO2: 24 mmol/L (ref 22–32)
Calcium: 8.7 mg/dL — ABNORMAL LOW (ref 8.9–10.3)
Chloride: 102 mmol/L (ref 98–111)
Creatinine, Ser: 1.15 mg/dL (ref 0.61–1.24)
GFR, Estimated: >60 mL/min (ref >60)
Glucose, Bld: 195 mg/dL — ABNORMAL HIGH (ref 70–99)
Potassium: 3.6 mmol/L (ref 3.5–5.1)
Sodium: 139 mmol/L (ref 135–145)
Total Bilirubin: 0.5 mg/dL (ref 0.0–1.2)
Total Protein: 6.4 g/dL — ABNORMAL LOW (ref 6.5–8.1)

## 2024-02-20 LAB — CBC WITH DIFFERENTIAL/PLATELET
Abs Immature Granulocytes: 0.08 10*3/uL — ABNORMAL HIGH (ref 0.00–0.07)
Basophils Absolute: 0.1 10*3/uL (ref 0.0–0.1)
Basophils Relative: 1 %
Eosinophils Absolute: 0.3 10*3/uL (ref 0.0–0.5)
Eosinophils Relative: 3 %
HCT: 31 % — ABNORMAL LOW (ref 39.0–52.0)
Hemoglobin: 9.6 g/dL — ABNORMAL LOW (ref 13.0–17.0)
Immature Granulocytes: 1 %
Lymphocytes Relative: 21 %
Lymphs Abs: 2.1 10*3/uL (ref 0.7–4.0)
MCH: 24.7 pg — ABNORMAL LOW (ref 26.0–34.0)
MCHC: 31 g/dL (ref 30.0–36.0)
MCV: 79.9 fL — ABNORMAL LOW (ref 80.0–100.0)
Monocytes Absolute: 0.9 10*3/uL (ref 0.1–1.0)
Monocytes Relative: 9 %
Neutro Abs: 6.4 10*3/uL (ref 1.7–7.7)
Neutrophils Relative %: 65 %
Platelets: 208 10*3/uL (ref 150–400)
RBC: 3.88 MIL/uL — ABNORMAL LOW (ref 4.22–5.81)
RDW: 15.2 % (ref 11.5–15.5)
WBC: 9.8 10*3/uL (ref 4.0–10.5)
nRBC: 0 % (ref 0.0–0.2)

## 2024-02-20 LAB — TROPONIN T, HIGH SENSITIVITY: Troponin T High Sensitivity: 39 ng/L — ABNORMAL HIGH (ref <19)

## 2024-02-20 LAB — LACTIC ACID, PLASMA: Lactic Acid, Venous: 1.7 mmol/L (ref 0.5–1.9)

## 2024-02-20 LAB — RESP PANEL BY RT-PCR (RSV, FLU A&B, COVID)  RVPGX2
Influenza A by PCR: NEGATIVE
Influenza B by PCR: NEGATIVE
Resp Syncytial Virus by PCR: NEGATIVE
SARS Coronavirus 2 by RT PCR: NEGATIVE

## 2024-02-20 MED ORDER — METHYLPREDNISOLONE SODIUM SUCC 125 MG IJ SOLR
125.0000 mg | Freq: Once | INTRAMUSCULAR | Status: AC
  Administered 2024-02-20: 125 mg via INTRAVENOUS
  Filled 2024-02-20: qty 2

## 2024-02-20 MED ORDER — IPRATROPIUM-ALBUTEROL 0.5-2.5 (3) MG/3ML IN SOLN
3.0000 mL | Freq: Once | RESPIRATORY_TRACT | Status: AC
  Administered 2024-02-20: 3 mL via RESPIRATORY_TRACT
  Filled 2024-02-20: qty 3

## 2024-02-20 MED ORDER — IOHEXOL 350 MG/ML SOLN
75.0000 mL | Freq: Once | INTRAVENOUS | Status: AC | PRN
  Administered 2024-02-20: 75 mL via INTRAVENOUS

## 2024-02-20 MED ORDER — IPRATROPIUM-ALBUTEROL 0.5-2.5 (3) MG/3ML IN SOLN
3.0000 mL | RESPIRATORY_TRACT | Status: AC
  Administered 2024-02-20 (×2): 3 mL via RESPIRATORY_TRACT
  Filled 2024-02-20 (×2): qty 3

## 2024-02-20 NOTE — ED Triage Notes (Signed)
 Per wife, pt with [facility] since this morning; has gotten worse throughout the day; RT in to assess; pt tachypneic

## 2024-02-20 NOTE — ED Provider Notes (Signed)
 [place] [facility] Provider Note   CSN: [id number] Arrival date & time: 02/20/24  2139     Patient presents with: Shortness of Breath   Jason Abruzzo Dargis Sr. is a 73 y.o. male.   73 yo M with a chief complaints of difficulty breathing.  Started this morning.  Has gotten worse throughout the day.  He has a nebulizer at home and he tried it but did not have much improvement with it.  He has been coughing quite a bit today.  Was reportedly fine yesterday per his wife.  She provides most of the history and tells me he is very hard of hearing.  Did not bring his hearing aids today.  Has complained of some tightness to his chest especially with coughing.  Had hip replacement done about 2 months ago.  No fevers no known sick contacts.   Shortness of Breath      Prior to Admission medications   Medication Sig Start Date End Date Taking? Authorizing Provider  amLODipine  (NORVASC ) 5 MG tablet Take 1 tablet (5 mg total) by mouth daily. 10/12/23   Thomasena Fleming, NP  apixaban  (ELIQUIS ) 5 MG TABS tablet Take 1 tablet (5 mg total) by mouth 2 (two) times daily. 10/12/23   Thomasena Fleming, NP  apixaban  (ELIQUIS ) 5 MG TABS tablet Take 1 tablet (5 mg total) by mouth 2 (two) times daily. 10/12/23   Thomasena Fleming, NP  atorvastatin  (LIPITOR) 40 MG tablet Take 40 mg by mouth daily.    [provider]  carisoprodol (SOMA) 350 MG tablet Take 350 mg by mouth at bedtime as needed for muscle spasms.    [provider]  dexamethasone  (DECADRON ) 6 MG tablet Take 1 tablet (6 mg total) by mouth 2 (two) times daily with a meal. 11/01/23   Orvilla Blander, MD  doxazosin  (CARDURA ) 2 MG tablet Take 2 mg by mouth at bedtime.    [provider]  furosemide  (LASIX ) 40 MG tablet Take 40 mg by mouth daily.    [provider]  IFEREX 150 150 MG capsule Take 150 mg by mouth at bedtime. 10/07/21   [provider]  Insulin  Aspart Prot & Aspart  (NOVOLOG  MIX 70/30 [place]) Inject 20-30 Units into the skin every morning. 30 units in the morning, 20 units at bedtime    [provider]  lidocaine  (LIDODERM ) 5 % Place 1 patch onto the skin daily. Remove & Discard patch within 12 hours or as directed by MD 03/08/22   Coretha Dew, PA-C  losartan  (COZAAR ) 100 MG tablet Take 1 tablet (100 mg total) by mouth daily. 01/14/22   Santana Cue, MD  methocarbamol  (ROBAXIN ) 500 MG tablet Take 1 tablet (500 mg total) by mouth 2 (two) times daily. 03/08/22   Coretha Dew, PA-C  Multiple Vitamins-Minerals (CENTRUM SILVER ADULT 50+ PO) Take 1 tablet by mouth daily.    [provider]  nitroGLYCERIN  (NITROSTAT ) 0.4 MG SL tablet Place 0.4 mg under the tongue every 5 (five) minutes as needed for chest pain.    [provider]  ondansetron  (ZOFRAN ) 4 MG tablet Take 1 tablet (4 mg total) by mouth every 6 (six) hours. 06/10/23   Rolinda Climes, DO  oxyCODONE  (ROXICODONE ) 5 MG immediate release tablet Take 1 tablet (5 mg total) by mouth every 4 (four) hours as needed for severe pain. 04/25/22   Molpus, John, MD  oxyCODONE  (ROXICODONE ) 5 MG immediate release  tablet Take 1 tablet (5 mg total) by mouth every 8 (eight) hours as needed for up to 15 doses for severe pain (pain score 7-10). 09/19/23   Arvilla Birmingham, MD  pantoprazole  (PROTONIX ) 40 MG tablet Take 40 mg by mouth 2 (two) times daily. 11/30/21   [provider]  potassium chloride  (K-DUR) 10 MEQ tablet Take 30 mEq by mouth daily.    [provider]  senna-docusate (SENOKOT-S) 8.6-50 MG tablet Take 1 tablet by mouth at bedtime as needed for up to 30 doses for mild constipation. Take as needed while taking oxycodone  (opioids) to prevent constipation 09/19/23   Trifan, Janalyn Me, MD  SYMBICORT 160-4.5 MCG/ACT inhaler Inhale 2 puffs into the lungs in the morning and at bedtime. 12/19/21   [provider]  triamcinolone cream (KENALOG) 0.1 % Apply 1 application.  topically 2 (two) times daily as needed (itching). 12/14/21   [provider]    Allergies: Ativan  [lorazepam ], Keflex [cephalexin], Ace inhibitors, Prednisone, and Sulfa antibiotics    Review of Systems  Respiratory:  Positive for shortness of breath.     Updated Vital Signs BP (!) 168/73 (BP Location: Right Arm)   Pulse 74   Temp 99 F (37.2 C)   Resp (!) 28   Ht 5' 9 (1.753 m)   Wt 96.6 kg   SpO2 96%   BMI 31.45 kg/m   Physical Exam Vitals and nursing note reviewed.  Constitutional:      Appearance: He is well-developed.  HENT:     Head: Normocephalic and atraumatic.   Eyes:     Pupils: Pupils are equal, round, and reactive to light.   Neck:     Vascular: No JVD.   Cardiovascular:     Rate and Rhythm: Normal rate and regular rhythm.     Heart sounds: No murmur heard.    No friction rub. No gallop.  Pulmonary:     Effort: No respiratory distress.     Breath sounds: No wheezing.  Abdominal:     General: There is no distension.     Tenderness: There is no abdominal tenderness. There is no guarding or rebound.   Musculoskeletal:        General: Normal range of motion.     Cervical back: Normal range of motion and neck supple.   Skin:    Coloration: Skin is not pale.     Findings: No rash.   Neurological:     Mental Status: He is alert and oriented to person, place, and time.   Psychiatric:        Behavior: Behavior normal.     (all labs ordered are listed, but only abnormal results are displayed) Labs Reviewed  CBC WITH DIFFERENTIAL/PLATELET - Abnormal; Notable for the following components:      Result Value   RBC 3.88 (*)    Hemoglobin 9.6 (*)    HCT 31.0 (*)    MCV 79.9 (*)    MCH 24.7 (*)    Abs Immature Granulocytes 0.08 (*)    All other components within normal limits  RESP PANEL BY RT-PCR (RSV, FLU A&B, COVID)  RVPGX2  CULTURE, BLOOD (ROUTINE X 2)  CULTURE, BLOOD (ROUTINE X 2)  COMPREHENSIVE METABOLIC PANEL WITH GFR  PRO BRAIN  NATRIURETIC PEPTIDE  LACTIC ACID, PLASMA  LACTIC ACID, PLASMA  TROPONIN T, HIGH SENSITIVITY    EKG: EKG Interpretation Date/Time:  Saturday February 20 2024 21:53:29 EDT Ventricular Rate:  94 PR Interval:  141 QRS Duration:  149 QT Interval:  408 QTC Calculation: 511 R Axis:   -69  Text Interpretation: Sinus or ectopic atrial rhythm Atrial premature complex RBBB and LAFB Since last tracing rate faster Otherwise no significant change Confirmed by Albertus Hughs [phone]) on 02/20/2024 9:57:17 PM  Radiology: [facility] Query Chest Port 1 View Result Date: 02/20/2024 CLINICAL DATA:  Shortness of breath and cough. Onset this morning worsening throughout the day. EXAM: PORTABLE CHEST 1 VIEW COMPARISON:  AP Lat chest 11/01/2023 FINDINGS: There is a low inspiration on exam limiting visualization of the bases. The visualized hypoinflated lungs are generally clear. There may be a minimal left pleural effusion. The right sulci are sharp. There is mild cardiomegaly without evidence of CHF. Aortic atherosclerosis with stable mediastinum. Thoracic spondylosis with no new osseous findings. Overlying telemetry leads. IMPRESSION: 1. Low inspiration limiting visualization of the bases. 2. Possible minimal left pleural effusion. The visualized lungs otherwise generally clear. 3. Mild cardiomegaly without evidence of CHF. 4. Aortic atherosclerosis. Electronically Signed   By: Denman Fischer M.D.   On: 02/20/2024 22:36     Procedures   Medications Ordered in the ED  methylPREDNISolone  sodium succinate (SOLU-MEDROL ) 125 mg/2 mL injection 125 mg (has no administration in time range)  ipratropium-albuterol  (DUONEB) 0.5-2.5 (3) MG/3ML nebulizer solution 3 mL (3 mLs Nebulization Given 02/20/24 2205)  ipratropium-albuterol  (DUONEB) 0.5-2.5 (3) MG/3ML nebulizer solution 3 mL (3 mLs Nebulization Given 02/20/24 2258)                                    Medical Decision Making Amount and/or Complexity of Data Reviewed Labs:  ordered. Radiology: ordered.  Risk Prescription drug management.   73 yo M with a chief complaints of difficulty breathing.  This started this morning.  Has persisted throughout the day.  Has a history of COPD but family says he never smoked and do not know where that diagnosis came from.  No obvious wheezes on my initial exam.  Will try 2 DuoNebs.  Blood work.  Chest x-ray.  Reassess.  Chest x-ray independently interpreted by me without obvious focal infiltrates or pneumothorax.  COVID flu and RSV are negative.  Patient is feeling mildly better after 2 DuoNeb's.  Will give a third.  Dose of Solu-Medrol .  Patient's hemoglobin is 9.6.  And slightly down from last check when he had his hip replaced at 10.5  Will obtain CTA.  Likely admit.   Signed out to Dr Candelaria Chaco, please see their note for further details of care in the ED.  The patients results and plan were reviewed and discussed.   Any x-rays performed were independently reviewed by myself.   Differential diagnosis were considered with the presenting HPI.  Medications  methylPREDNISolone  sodium succinate (SOLU-MEDROL ) 125 mg/2 mL injection 125 mg (has no administration in time range)  ipratropium-albuterol  (DUONEB) 0.5-2.5 (3) MG/3ML nebulizer solution 3 mL (3 mLs Nebulization Given 02/20/24 2205)  ipratropium-albuterol  (DUONEB) 0.5-2.5 (3) MG/3ML nebulizer solution 3 mL (3 mLs Nebulization Given 02/20/24 2258)    Vitals:   02/20/24 2145 02/20/24 2146 02/20/24 2205 02/20/24 2258  BP:  (!) 168/73    Pulse:  74    Resp:  (!) 28    Temp:  99 F (37.2 C)    SpO2:  97% 98% 96%  Weight: 96.6 kg     Height: 5' 9 (1.753 m)       Final  diagnoses:  SOB (shortness of breath)        Final diagnoses:  SOB (shortness of breath)    ED Discharge Orders     None          Albertus Hughs, DO 02/20/24 2309

## 2024-02-20 NOTE — ED Provider Notes (Signed)
 Care assumed from Dr. Inga Manges, patient with dyspnea pending CT angiogram of chest. May need admission.  CT angiogram shows no evidence of pneumonia or pulmonary embolism.  I have independently viewed the images, and agree with the radiologist's interpretation.  I have reevaluated the patient, he is still dyspneic at rest.  I note elevated BNP and troponin.  Elevated troponin is felt to be secondary to demand ischemia, I have low index of suspicion for ACS but will repeat troponin to make sure is not rising.  On exam, I note peripheral edema which was not present at cardiology office visit on 10/12/2023.  Echocardiogram on 12/23/2021 showed ejection fraction of 50% and grade 1 diastolic dysfunction.  I have ordered a dose of furosemide .  He will need to be admitted.  I have discussed the case with Dr. Brock Canner of [facility], who agrees to admit the patient.  Results for orders placed or performed during the hospital encounter of 02/20/24  Resp panel by RT-PCR (RSV, Flu A&B, Covid) Anterior Nasal Swab   Collection Time: 02/20/24 10:00 PM   Specimen: Anterior Nasal Swab  Result Value Ref Range   SARS Coronavirus 2 by RT PCR NEGATIVE NEGATIVE   Influenza A by PCR NEGATIVE NEGATIVE   Influenza B by PCR NEGATIVE NEGATIVE   Resp Syncytial Virus by PCR NEGATIVE NEGATIVE  CBC with Differential   Collection Time: 02/20/24 10:39 PM  Result Value Ref Range   WBC 9.8 4.0 - 10.5 K/uL   RBC 3.88 (L) 4.22 - 5.81 MIL/uL   Hemoglobin 9.6 (L) 13.0 - 17.0 g/dL   HCT 13.0 (L) 86.5 - 78.4 %   MCV 79.9 (L) 80.0 - 100.0 fL   MCH 24.7 (L) 26.0 - 34.0 pg   MCHC 31.0 30.0 - 36.0 g/dL   RDW 69.6 29.5 - 28.4 %   Platelets 208 150 - 400 K/uL   nRBC 0.0 0.0 - 0.2 %   Neutrophils Relative % 65 %   Neutro Abs 6.4 1.7 - 7.7 K/uL   Lymphocytes Relative 21 %   Lymphs Abs 2.1 0.7 - 4.0 K/uL   Monocytes Relative 9 %   Monocytes Absolute 0.9 0.1 - 1.0 K/uL   Eosinophils Relative 3 %   Eosinophils Absolute 0.3 0.0 - 0.5  K/uL   Basophils Relative 1 %   Basophils Absolute 0.1 0.0 - 0.1 K/uL   Immature Granulocytes 1 %   Abs Immature Granulocytes 0.08 (H) 0.00 - 0.07 K/uL  Comprehensive metabolic panel   Collection Time: 02/20/24 10:39 PM  Result Value Ref Range   Sodium 139 135 - 145 mmol/L   Potassium 3.6 3.5 - 5.1 mmol/L   Chloride 102 98 - 111 mmol/L   CO2 24 22 - 32 mmol/L   Glucose, Bld 195 (H) 70 - 99 mg/dL   BUN 11 8 - 23 mg/dL   Creatinine, Ser 1.32 0.61 - 1.24 mg/dL   Calcium  8.7 (L) 8.9 - 10.3 mg/dL   Total Protein 6.4 (L) 6.5 - 8.1 g/dL   Albumin 4.2 3.5 - 5.0 g/dL   AST 20 15 - 41 U/L   ALT 11 0 - 44 U/L   Alkaline Phosphatase 131 (H) 38 - 126 U/L   Total Bilirubin 0.5 0.0 - 1.2 mg/dL   GFR, Estimated >44 >01 mL/min   Anion gap 12 5 - 15  Pro Brain natriuretic peptide   Collection Time: 02/20/24 10:39 PM  Result Value Ref Range   Pro Brain Natriuretic Peptide  515.0 (H) <300.0 pg/mL  Lactic acid, plasma   Collection Time: 02/20/24 10:39 PM  Result Value Ref Range   Lactic Acid, Venous 1.7 0.5 - 1.9 mmol/L  Troponin T, High Sensitivity   Collection Time: 02/20/24 10:39 PM  Result Value Ref Range   Troponin T High Sensitivity 39 (H) <19 ng/L   CT Angio Chest PE W and/or Wo Contrast Result Date: 02/21/2024 CLINICAL DATA:  Shortness of breath onset this morning worsening throughout the day. EXAM: CT ANGIOGRAPHY CHEST WITH CONTRAST TECHNIQUE: Multidetector CT imaging of the chest was performed using the standard protocol during bolus administration of intravenous contrast. Multiplanar CT image reconstructions and MIPs were obtained to evaluate the vascular anatomy. RADIATION DOSE REDUCTION: This exam was performed according to the departmental dose-optimization program which includes automated exposure control, adjustment of the mA and/or kV according to patient size and/or use of iterative reconstruction technique. CONTRAST:  75mL OMNIPAQUE  IOHEXOL  350 MG/ML SOLN COMPARISON:  Portable  chest earlier today, AP Lat chest 11/01/2023, CTA chest 06/10/2023, CTA chest 04/10/2018. FINDINGS: Cardiovascular: Arterial opacification is diagnostic. The pulmonary trunk is prominent measuring 3.2 cm compatible with arterial hypertension, previously 3.0 cm. No arterial embolic filling defect is seen. There is mild cardiomegaly with again noted left ventricular wall and septal hypertrophy somewhat narrowing the left ventricular outflow tract but no more than previously. The aorta is normal in caliber and course. There is mild atherosclerosis in the aorta and great vessels without aneurysm or dissection. There is no pericardial effusion. There are three-vessel scattered coronary artery calcific plaques. Normal pulmonary veins. Mediastinum/Nodes: No lymphadenopathy. Negative thyroid gland, thoracic trachea, main bronchi, thoracic esophagus. Lungs/Pleura: There are trace pleural effusions. Calcified granuloma right lower lobe. There is mild bronchial thickening in the lower lobes but no visible bronchial impactions or bronchiectasis. Posterior atelectasis is again noted in the lower lobes and mosaicism in the lower lung fields fall the latter most likely due to small airways disease and air trapping. No pulmonary consolidation or suspicious nodules are seen. No pulmonary fibrosis. Stable 7 mm ovoid pleural based nodule again noted medial right middle lobe on 303:64, with almost 6 years of stability consistent with a benign entity. Upper Abdomen: No acute abnormality. Musculoskeletal: There is kyphosis and degenerative change of the spine. No acute or other significant osseous findings. Unremarkable visualized chest wall. Review of the MIP images confirms the above findings. IMPRESSION: 1. No evidence of arterial embolus. 2. Prominent pulmonary trunk 3.2 cm compatible with arterial hypertension. 3. Mild cardiomegaly with left ventricular wall and septal hypertrophy somewhat narrowing the left ventricular outflow  tract but no more than previously. 4. Aortic and coronary artery atherosclerosis. 5. Trace pleural effusions with mild bronchial thickening in the lower lobes. 6. Mosaicism in the lower lung fields most likely due to small airways disease and air trapping. No focal infiltrate. 7. Stable 7 mm right middle lobe nodule for greater than 5 years consistent with a benign entity. Aortic Atherosclerosis (ICD10-I70.0). Electronically Signed   By: Denman Fischer M.D.   On: 02/21/2024 00:16   DG Chest Port 1 View Result Date: 02/20/2024 CLINICAL DATA:  Shortness of breath and cough. Onset this morning worsening throughout the day. EXAM: PORTABLE CHEST 1 VIEW COMPARISON:  AP Lat chest 11/01/2023 FINDINGS: There is a low inspiration on exam limiting visualization of the bases. The visualized hypoinflated lungs are generally clear. There may be a minimal left pleural effusion. The right sulci are sharp. There is mild cardiomegaly without evidence of  CHF. Aortic atherosclerosis with stable mediastinum. Thoracic spondylosis with no new osseous findings. Overlying telemetry leads. IMPRESSION: 1. Low inspiration limiting visualization of the bases. 2. Possible minimal left pleural effusion. The visualized lungs otherwise generally clear. 3. Mild cardiomegaly without evidence of CHF. 4. Aortic atherosclerosis. Electronically Signed   By: Denman Fischer M.D.   On: 02/20/2024 22:36      Alissa April, MD 02/21/24 0120

## 2024-02-21 DIAGNOSIS — I5033 Acute on chronic diastolic (congestive) heart failure: Secondary | ICD-10-CM | POA: Diagnosis not present

## 2024-02-21 LAB — MRSA NEXT GEN BY PCR, NASAL: MRSA by PCR Next Gen: NOT DETECTED

## 2024-02-21 LAB — HEMOGLOBIN A1C
Hgb A1c MFr Bld: 6.8 % — ABNORMAL HIGH (ref 4.8–5.6)
Mean Plasma Glucose: 148.46 mg/dL

## 2024-02-21 LAB — TROPONIN T, HIGH SENSITIVITY: Troponin T High Sensitivity: 36 ng/L — ABNORMAL HIGH (ref <19)

## 2024-02-21 LAB — GLUCOSE, CAPILLARY
Glucose-Capillary: 277 mg/dL — ABNORMAL HIGH (ref 70–99)
Glucose-Capillary: 324 mg/dL — ABNORMAL HIGH (ref 70–99)

## 2024-02-21 LAB — CBG MONITORING, ED
Glucose-Capillary: 369 mg/dL — ABNORMAL HIGH (ref 70–99)
Glucose-Capillary: 363 mg/dL — ABNORMAL HIGH (ref 70–99)

## 2024-02-21 MED ORDER — ACETAMINOPHEN 650 MG RE SUPP: 650.0000 mg | Freq: Four times a day (QID) | RECTAL | Status: DC | PRN

## 2024-02-21 MED ORDER — INSULIN ASPART PROT & ASPART (70-30 MIX) 100 UNIT/ML ~~LOC~~ SUSP
20.0000 [IU] | Freq: Every day | SUBCUTANEOUS | Status: DC
  Administered 2024-02-21 – 2024-02-22 (×2): 20 [IU] via SUBCUTANEOUS
  Filled 2024-02-21: qty 10

## 2024-02-21 MED ORDER — ATORVASTATIN CALCIUM 40 MG PO TABS
40.0000 mg | ORAL_TABLET | Freq: Every day | ORAL | Status: DC
  Administered 2024-02-21 – 2024-02-22 (×2): 40 mg via ORAL
  Filled 2024-02-21 (×2): qty 1

## 2024-02-21 MED ORDER — INSULIN ASPART PROT & ASPART (70-30 MIX) 100 UNIT/ML ~~LOC~~ SUSP
30.0000 [IU] | Freq: Every day | SUBCUTANEOUS | Status: DC
  Administered 2024-02-21 – 2024-02-22 (×2): 30 [IU] via SUBCUTANEOUS
  Filled 2024-02-21: qty 30

## 2024-02-21 MED ORDER — DOXAZOSIN MESYLATE 2 MG PO TABS
2.0000 mg | ORAL_TABLET | Freq: Every day | ORAL | Status: DC
  Administered 2024-02-21: 2 mg via ORAL
  Filled 2024-02-21 (×3): qty 1

## 2024-02-21 MED ORDER — APIXABAN 5 MG PO TABS
5.0000 mg | ORAL_TABLET | Freq: Two times a day (BID) | ORAL | Status: DC
  Administered 2024-02-21 – 2024-02-22 (×3): 5 mg via ORAL
  Filled 2024-02-21: qty 1
  Filled 2024-02-21: qty 2
  Filled 2024-02-21: qty 1

## 2024-02-21 MED ORDER — ACETAMINOPHEN 325 MG PO TABS
650.0000 mg | ORAL_TABLET | Freq: Four times a day (QID) | ORAL | Status: DC | PRN
  Administered 2024-02-21 – 2024-02-22 (×2): 650 mg via ORAL
  Filled 2024-02-21 (×2): qty 2

## 2024-02-21 MED ORDER — INSULIN ASPART 100 UNIT/ML IJ SOLN
0.0000 [IU] | Freq: Every day | INTRAMUSCULAR | Status: DC
  Administered 2024-02-21: 4 [IU] via SUBCUTANEOUS

## 2024-02-21 MED ORDER — HYDRALAZINE HCL 50 MG PO TABS
50.0000 mg | ORAL_TABLET | Freq: Three times a day (TID) | ORAL | Status: DC
  Administered 2024-02-21 – 2024-02-22 (×3): 50 mg via ORAL
  Filled 2024-02-21 (×4): qty 1

## 2024-02-21 MED ORDER — FUROSEMIDE 10 MG/ML IJ SOLN
40.0000 mg | Freq: Once | INTRAMUSCULAR | Status: AC
  Administered 2024-02-21: 40 mg via INTRAVENOUS
  Filled 2024-02-21: qty 4

## 2024-02-21 MED ORDER — PROMETHAZINE HCL 25 MG PO TABS: 12.5000 mg | ORAL_TABLET | Freq: Four times a day (QID) | ORAL | Status: DC | PRN

## 2024-02-21 MED ORDER — AMLODIPINE BESYLATE 5 MG PO TABS
5.0000 mg | ORAL_TABLET | Freq: Every day | ORAL | Status: DC
  Administered 2024-02-21 – 2024-02-22 (×2): 5 mg via ORAL
  Filled 2024-02-21 (×2): qty 1

## 2024-02-21 MED ORDER — POLYSACCHARIDE IRON COMPLEX 150 MG PO CAPS
150.0000 mg | ORAL_CAPSULE | Freq: Every day | ORAL | Status: DC
  Administered 2024-02-21: 150 mg via ORAL
  Filled 2024-02-21 (×2): qty 1

## 2024-02-21 MED ORDER — INSULIN ASPART 100 UNIT/ML IJ SOLN
0.0000 [IU] | Freq: Three times a day (TID) | INTRAMUSCULAR | Status: DC
  Administered 2024-02-21: 8 [IU] via SUBCUTANEOUS
  Administered 2024-02-22 (×2): 3 [IU] via SUBCUTANEOUS
  Administered 2024-02-22: 2 [IU] via SUBCUTANEOUS

## 2024-02-21 MED ORDER — PANTOPRAZOLE SODIUM 40 MG PO TBEC
40.0000 mg | DELAYED_RELEASE_TABLET | Freq: Two times a day (BID) | ORAL | Status: DC
  Administered 2024-02-21 – 2024-02-22 (×2): 40 mg via ORAL
  Filled 2024-02-21 (×2): qty 1

## 2024-02-21 MED ORDER — LOSARTAN POTASSIUM 50 MG PO TABS
100.0000 mg | ORAL_TABLET | Freq: Every day | ORAL | Status: DC
  Administered 2024-02-21: 100 mg via ORAL
  Filled 2024-02-21: qty 4

## 2024-02-21 MED ORDER — ISOSORBIDE MONONITRATE ER 30 MG PO TB24
30.0000 mg | ORAL_TABLET | Freq: Every day | ORAL | Status: DC
  Administered 2024-02-21 – 2024-02-22 (×2): 30 mg via ORAL
  Filled 2024-02-21 (×2): qty 1

## 2024-02-21 MED ORDER — BISACODYL 5 MG PO TBEC: 5.0000 mg | DELAYED_RELEASE_TABLET | Freq: Every day | ORAL | Status: DC | PRN

## 2024-02-21 MED ORDER — POTASSIUM CHLORIDE CRYS ER 20 MEQ PO TBCR
40.0000 meq | EXTENDED_RELEASE_TABLET | Freq: Once | ORAL | Status: AC
  Administered 2024-02-21: 40 meq via ORAL
  Filled 2024-02-21: qty 2

## 2024-02-21 NOTE — ED Notes (Signed)
 ED TO INPATIENT HANDOFF REPORT  ED Nurse Name and Phone #: Jason Mess, RN [phone]  S Name/Age/Gender Jason Parcel Basil Sr. 73 y.o. male Room/Bed: MH12/MH12  Code Status   Code Status: Prior  Home/SNF/Other Home Patient oriented to: self, place, time, and situation Is this baseline? Yes   Triage Complete: Triage complete  Chief Complaint Acute on chronic diastolic heart failure (HCC) [I50.33]  Triage Note Per wife, pt with [facility] since this morning; has gotten worse throughout the day; RT in to assess; pt tachypneic   Allergies Allergies  Allergen Reactions   Ativan  [Lorazepam ] Other (See Comments)    Hallucinated / Confused / Combative   Keflex [Cephalexin] Hives   Ace Inhibitors Cough        Prednisone Rash   Sulfa Antibiotics Rash    Level of Care/Admitting Diagnosis ED Disposition     ED Disposition  Admit   Condition  --   Comment  Hospital Area: [facility] [[id number]]  Level of Care: Telemetry Cardiac [[id number]]  Interfacility transfer: Yes  May place patient in observation at [facility] or [facility] if equivalent level of care is available:: No  Covid Evaluation: Asymptomatic - no recent exposure (last 10 days) testing not required  Diagnosis: Acute on chronic diastolic heart failure (HCC) [428.33.ICD-9-CM]  Admitting Physician: HOWERTER, JUSTIN B [[id number]]  Attending Physician: HOWERTER, JUSTIN B [[id number]]          B Medical/Surgery History Past Medical History:  Diagnosis Date   Acute metabolic encephalopathy 12/22/2021   Arm DVT (deep venous thromboembolism), acute (HCC)    Bleeding from wound 01/12/2022   Brachial plexopathy 05/14/2015   Cellulitis of middle toe 02/25/2016   Cervical radiculopathy at C8 05/10/2015   Chronic kidney disease    Cord compression (HCC) 05/10/2015   Coronary artery disease    Deep vein thrombosis (DVT) of distal vein of left lower extremity of indeterminate age [facility]) 12/29/2021   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 Added automatically from request for surgery [id number]   Metatarsalgia of left foot 08/21/2015   Metatarsalgia of right foot 07/03/2015   MRSA bacteremia 06/18/2017   MVP (mitral valve prolapse) 06/10/2013   Myocardial infarction [facility])    pt states that he had a light heart attack about 25 yrs ago   Pacemaker    Pacemaker pocket hematoma 01/12/2022   Pneumonia    Renal cell carcinoma of right kidney (HCC) 06/09/2019   Right renal mass 03/31/2016   S/P placement of cardiac pacemaker    Scrotal pain 12/22/2021   Severe sepsis (HCC) 12/22/2021   Snoring 08/25/2019   Stroke (HCC)    approx 15 yrs ago   Past Surgical History:  Procedure Laterality Date   BUBBLE STUDY  12/26/2021   Procedure: BUBBLE STUDY;  Surgeon: Euell Herrlich, MD;  Location: [facility] ENDOSCOPY;  Service: Cardiovascular;;   IR GENERIC HISTORICAL  04/22/2016   IR RADIOLOGIST EVAL & MGMT 04/22/2016 Lucinda Saber, MD [facility] INTERV RAD   IR RADIOLOGIST EVAL & MGMT  05/18/2019  IR RADIOLOGIST EVAL & MGMT  07/06/2019   IR RADIOLOGIST EVAL & MGMT  10/26/2019   IR RADIOLOGIST EVAL & MGMT  10/30/2020   IR RADIOLOGIST EVAL & MGMT  11/01/2021   PACEMAKER IMPLANT  05/2017   PACEMAKER LEAD REMOVAL N/A 12/29/2021   Procedure: PACEMAKER LEAD REMOVAL;  Surgeon: Tammie Fall, MD;  Location: [facility] OR;  Service: Cardiovascular;  Laterality: N/A;   PACEMAKER REMOVAL  07/2017   TEE WITHOUT CARDIOVERSION N/A 12/26/2021   Procedure: TRANSESOPHAGEAL ECHOCARDIOGRAM (TEE);   Surgeon: Euell Herrlich, MD;  Location: [facility] ENDOSCOPY;  Service: Cardiovascular;  Laterality: N/A;     A IV Location/Drains/Wounds Patient Lines/Drains/Airways Status     Active Line/Drains/Airways     Name Placement date Placement time Site Days   Peripheral IV 02/20/24 18 G 1 Right Antecubital 02/20/24  2220  Antecubital  1   Peripheral IV 02/20/24 22 G Left Wrist 02/20/24  2215  Wrist  1            Intake/Output Last 24 hours  Intake/Output Summary (Last 24 hours) at 02/21/2024 1516 Last data filed at 02/21/2024 2956 Gross per 24 hour  Intake --  Output 3550 ml  Net -3550 ml    Labs/Imaging Results for orders placed or performed during the hospital encounter of 02/20/24 (from the past 48 hours)  Resp panel by RT-PCR (RSV, Flu A&B, Covid) Anterior Nasal Swab     Status: None   Collection Time: 02/20/24 10:00 PM   Specimen: Anterior Nasal Swab  Result Value Ref Range   SARS Coronavirus 2 by RT PCR NEGATIVE NEGATIVE    Comment: (NOTE) SARS-CoV-2 target nucleic acids are NOT DETECTED.  The SARS-CoV-2 RNA is generally detectable in upper respiratory specimens during the acute phase of infection. The lowest concentration of SARS-CoV-2 viral copies this assay can detect is 138 copies/mL. A negative result does not preclude SARS-Cov-2 infection and should not be used as the sole basis for treatment or other patient management decisions. A negative result may occur with  improper specimen collection/handling, submission of specimen other than nasopharyngeal swab, presence of viral mutation(s) within the areas targeted by this assay, and inadequate number of viral copies(<138 copies/mL). A negative result must be combined with clinical observations, patient history, and epidemiological information. The expected result is Negative.  Fact Sheet for Patients:  BloggerCourse.com  Fact Sheet for Healthcare Providers:   SeriousBroker.it  This test is no t yet approved or cleared by the [place]  FDA and  has been authorized for detection and/or diagnosis of SARS-CoV-2 by FDA under an Emergency Use Authorization (EUA). This EUA will remain  in effect (meaning this test can be used) for the duration of the COVID-19 declaration under Section 564(b)(1) of the Act, 21 U.S.C.section 360bbb-3(b)(1), unless the authorization is terminated  or revoked sooner.       Influenza A by PCR NEGATIVE NEGATIVE   Influenza B by PCR NEGATIVE NEGATIVE    Comment: (NOTE) The Xpert Xpress SARS-CoV-2/FLU/RSV plus assay is intended as an aid in the diagnosis of influenza from Nasopharyngeal swab specimens and should not be used as a sole basis for treatment. Nasal washings and aspirates are unacceptable for Xpert Xpress SARS-CoV-2/FLU/RSV testing.  Fact Sheet for Patients: BloggerCourse.com  Fact Sheet for Healthcare Providers: SeriousBroker.it  This test is not yet approved or cleared by the [place]  FDA and has been authorized for detection and/or diagnosis of SARS-CoV-2 by FDA under an Emergency Use Authorization (EUA). This EUA  will remain in effect (meaning this test can be used) for the duration of the COVID-19 declaration under Section 564(b)(1) of the Act, 21 U.S.C. section 360bbb-3(b)(1), unless the authorization is terminated or revoked.     Resp Syncytial Virus by PCR NEGATIVE NEGATIVE    Comment: (NOTE) Fact Sheet for Patients: BloggerCourse.com  Fact Sheet for Healthcare Providers: SeriousBroker.it  This test is not yet approved or cleared by the [place]  FDA and has been authorized for detection and/or diagnosis of SARS-CoV-2 by FDA under an Emergency Use Authorization (EUA). This EUA will remain in effect (meaning this test can be used) for the duration of  the COVID-19 declaration under Section 564(b)(1) of the Act, 21 U.S.C. section 360bbb-3(b)(1), unless the authorization is terminated or revoked.  Performed at [facility], [address] [place], [place] [address]   CBC with Differential     Status: Abnormal   Collection Time: 02/20/24 10:39 PM  Result Value Ref Range   WBC 9.8 4.0 - 10.5 K/uL   RBC 3.88 (L) 4.22 - 5.81 MIL/uL   Hemoglobin 9.6 (L) 13.0 - 17.0 g/dL   HCT 60.4 (L) 54.0 - 98.1 %   MCV 79.9 (L) 80.0 - 100.0 fL   MCH 24.7 (L) 26.0 - 34.0 pg   MCHC 31.0 30.0 - 36.0 g/dL   RDW 19.1 47.8 - 29.5 %   Platelets 208 150 - 400 K/uL   nRBC 0.0 0.0 - 0.2 %   Neutrophils Relative % 65 %   Neutro Abs 6.4 1.7 - 7.7 K/uL   Lymphocytes Relative 21 %   Lymphs Abs 2.1 0.7 - 4.0 K/uL   Monocytes Relative 9 %   Monocytes Absolute 0.9 0.1 - 1.0 K/uL   Eosinophils Relative 3 %   Eosinophils Absolute 0.3 0.0 - 0.5 K/uL   Basophils Relative 1 %   Basophils Absolute 0.1 0.0 - 0.1 K/uL   Immature Granulocytes 1 %   Abs Immature Granulocytes 0.08 (H) 0.00 - 0.07 K/uL    Comment: Performed at [facility], [address] [place], [place] [address]  Comprehensive metabolic panel     Status: Abnormal   Collection Time: 02/20/24 10:39 PM  Result Value Ref Range   Sodium 139 135 - 145 mmol/L   Potassium 3.6 3.5 - 5.1 mmol/L   Chloride 102 98 - 111 mmol/L   CO2 24 22 - 32 mmol/L   Glucose, Bld 195 (H) 70 - 99 mg/dL    Comment: Glucose reference range applies only to samples taken after fasting for at least 8 hours.   BUN 11 8 - 23 mg/dL   Creatinine, Ser 8.65 0.61 - 1.24 mg/dL   Calcium  8.7 (L) 8.9 - 10.3 mg/dL   Total Protein 6.4 (L) 6.5 - 8.1 g/dL   Albumin 4.2 3.5 - 5.0 g/dL   AST 20 15 - 41 U/L   ALT 11 0 - 44 U/L   Alkaline Phosphatase 131 (H) 38 - 126 U/L   Total Bilirubin 0.5 0.0 - 1.2 mg/dL   GFR, Estimated >78 >46 mL/min    Comment: (NOTE) Calculated using the CKD-EPI Creatinine Equation (2021)     Anion gap 12 5 - 15    Comment: Performed at [facility], [address] [place], [place] [address]  Troponin T, High Sensitivity     Status: Abnormal   Collection Time: 02/20/24 10:39 PM  Result Value Ref Range   Troponin  T High Sensitivity 39 (H) <19 ng/L    Comment: (NOTE) Biotin concentrations > 1000 ng/mL falsely decrease TnT results.  Serial cardiac troponin measurements are suggested.  Refer to the Links section for chest pain algorithms and additional  guidance. Performed at [facility], [address] [place], [place] [address]   Pro Brain natriuretic peptide     Status: Abnormal   Collection Time: 02/20/24 10:39 PM  Result Value Ref Range   Pro Brain Natriuretic Peptide 515.0 (H) <300.0 pg/mL    Comment: (NOTE) Age Group        Cut-Points    Interpretation  < 50 years     450 pg/mL       NT-proBNP > 450 pg/mL indicates                                ADHF is likely              50 to 75 years  900 pg/mL      NT-proBNP > 900 pg/mL indicates          ADHF is likely  > 75 years      1800 pg/mL     NT-proBNP > 1800 pg/mL indicates          ADHF is likely                           All ages    Results between       Indeterminate. Further clinical             300 and the cut-   information is needed to determine            point for age group   if ADHF is present.                                                             Elecsys proBNP II/ Elecsys proBNP II STAT           Cut-Point                       Interpretation  300 pg/mL                    NT-proBNP <300pg/mL indicates                             ADHF is not likely  Performed at [facility], [address] [place], [place] [address]   Lactic acid, plasma     Status: None   Collection Time: 02/20/24 10:39 PM  Result Value Ref Range   Lactic Acid, Venous 1.7 0.5 - 1.9 mmol/L    Comment: Performed at [facility], [address] [place], [place]  [address]  Troponin T, High Sensitivity     Status: Abnormal   Collection Time: 02/21/24  1:24 AM  Result Value Ref Range   Troponin T High Sensitivity 36 (H) <19 ng/L    Comment: (NOTE) Biotin concentrations > 1000 ng/mL falsely decrease TnT results.  Serial cardiac  troponin measurements are suggested.  Refer to the Links section for chest pain algorithms and additional  guidance. Performed at [facility], [address] [place], [place] [address]   CBG monitoring, ED     Status: Abnormal   Collection Time: 02/21/24  5:02 AM  Result Value Ref Range   Glucose-Capillary 369 (H) 70 - 99 mg/dL    Comment: Glucose reference range applies only to samples taken after fasting for at least 8 hours.   Comment 1 Notify RN   CBG monitoring, ED     Status: Abnormal   Collection Time: 02/21/24  7:24 AM  Result Value Ref Range   Glucose-Capillary 363 (H) 70 - 99 mg/dL    Comment: Glucose reference range applies only to samples taken after fasting for at least 8 hours.   CT Angio Chest PE W and/or Wo Contrast Result Date: 02/21/2024 CLINICAL DATA:  Shortness of breath onset this morning worsening throughout the day. EXAM: CT ANGIOGRAPHY CHEST WITH CONTRAST TECHNIQUE: Multidetector CT imaging of the chest was performed using the standard protocol during bolus administration of intravenous contrast. Multiplanar CT image reconstructions and MIPs were obtained to evaluate the vascular anatomy. RADIATION DOSE REDUCTION: This exam was performed according to the departmental dose-optimization program which includes automated exposure control, adjustment of the mA and/or kV according to patient size and/or use of iterative reconstruction technique. CONTRAST:  75mL OMNIPAQUE  IOHEXOL  350 MG/ML SOLN COMPARISON:  Portable chest earlier today, AP Lat chest 11/01/2023, CTA chest 06/10/2023, CTA chest 04/10/2018. FINDINGS: Cardiovascular: Arterial opacification is diagnostic. The pulmonary trunk is prominent  measuring 3.2 cm compatible with arterial hypertension, previously 3.0 cm. No arterial embolic filling defect is seen. There is mild cardiomegaly with again noted left ventricular wall and septal hypertrophy somewhat narrowing the left ventricular outflow tract but no more than previously. The aorta is normal in caliber and course. There is mild atherosclerosis in the aorta and great vessels without aneurysm or dissection. There is no pericardial effusion. There are three-vessel scattered coronary artery calcific plaques. Normal pulmonary veins. Mediastinum/Nodes: No lymphadenopathy. Negative thyroid gland, thoracic trachea, main bronchi, thoracic esophagus. Lungs/Pleura: There are trace pleural effusions. Calcified granuloma right lower lobe. There is mild bronchial thickening in the lower lobes but no visible bronchial impactions or bronchiectasis. Posterior atelectasis is again noted in the lower lobes and mosaicism in the lower lung fields fall the latter most likely due to small airways disease and air trapping. No pulmonary consolidation or suspicious nodules are seen. No pulmonary fibrosis. Stable 7 mm ovoid pleural based nodule again noted medial right middle lobe on 303:64, with almost 6 years of stability consistent with a benign entity. Upper Abdomen: No acute abnormality. Musculoskeletal: There is kyphosis and degenerative change of the spine. No acute or other significant osseous findings. Unremarkable visualized chest wall. Review of the MIP images confirms the above findings. IMPRESSION: 1. No evidence of arterial embolus. 2. Prominent pulmonary trunk 3.2 cm compatible with arterial hypertension. 3. Mild cardiomegaly with left ventricular wall and septal hypertrophy somewhat narrowing the left ventricular outflow tract but no more than previously. 4. Aortic and coronary artery atherosclerosis. 5. Trace pleural effusions with mild bronchial thickening in the lower lobes. 6. Mosaicism in the lower lung  fields most likely due to small airways disease and air trapping. No focal infiltrate. 7. Stable 7 mm right middle lobe nodule for greater than 5 years consistent with a benign entity. Aortic Atherosclerosis (ICD10-I70.0). Electronically Signed   By:  Denman Fischer M.D.   On: 02/21/2024 00:16   DG Chest Port 1 View Result Date: 02/20/2024 CLINICAL DATA:  Shortness of breath and cough. Onset this morning worsening throughout the day. EXAM: PORTABLE CHEST 1 VIEW COMPARISON:  AP Lat chest 11/01/2023 FINDINGS: There is a low inspiration on exam limiting visualization of the bases. The visualized hypoinflated lungs are generally clear. There may be a minimal left pleural effusion. The right sulci are sharp. There is mild cardiomegaly without evidence of CHF. Aortic atherosclerosis with stable mediastinum. Thoracic spondylosis with no new osseous findings. Overlying telemetry leads. IMPRESSION: 1. Low inspiration limiting visualization of the bases. 2. Possible minimal left pleural effusion. The visualized lungs otherwise generally clear. 3. Mild cardiomegaly without evidence of CHF. 4. Aortic atherosclerosis. Electronically Signed   By: Denman Fischer M.D.   On: 02/20/2024 22:36    Pending Labs Unresulted Labs (From admission, onward)     Start     Ordered   02/20/24 2156  Blood culture (routine x 2)  BLOOD CULTURE X 2,   STAT      02/20/24 2155            Vitals/Pain Today's Vitals   02/21/24 1130 02/21/24 1400 02/21/24 1430 02/21/24 1447  BP: (!) 167/87 (!) 150/112 (!) 151/70   Pulse: 89 87 79   Resp: (!) 22 (!) 25 19   Temp:    98.6 F (37 C)  TempSrc:    Oral  SpO2: 96% 97% 96%   Weight:      Height:      PainSc:        Isolation Precautions No active isolations  Medications Medications  amLODipine  (NORVASC ) tablet 5 mg (5 mg Oral Given 02/21/24 0924)  apixaban  (ELIQUIS ) tablet 5 mg (5 mg Oral Given 02/21/24 0559)  atorvastatin  (LIPITOR) tablet 40 mg (40 mg Oral Given 02/21/24  0924)  doxazosin  (CARDURA ) tablet 2 mg (has no administration in time range)  iron  polysaccharides (NIFEREX) capsule 150 mg (has no administration in time range)  losartan  (COZAAR ) tablet 100 mg (100 mg Oral Given 02/21/24 0924)  pantoprazole  (PROTONIX ) EC tablet 40 mg (0 mg Oral Hold 02/21/24 0924)  insulin  aspart protamine- aspart (NOVOLOG  MIX 70/30) injection 30 Units (30 Units Subcutaneous Given 02/21/24 0828)  insulin  aspart protamine- aspart (NOVOLOG  MIX 70/30) injection 20 Units (has no administration in time range)  ipratropium-albuterol  (DUONEB) 0.5-2.5 (3) MG/3ML nebulizer solution 3 mL (3 mLs Nebulization Given 02/20/24 2205)  ipratropium-albuterol  (DUONEB) 0.5-2.5 (3) MG/3ML nebulizer solution 3 mL (3 mLs Nebulization Given 02/20/24 2258)  methylPREDNISolone  sodium succinate (SOLU-MEDROL ) 125 mg/2 mL injection 125 mg (125 mg Intravenous Given 02/20/24 2312)  iohexol  (OMNIPAQUE ) 350 MG/ML injection 75 mL (75 mLs Intravenous Contrast Given 02/20/24 2350)  furosemide  (LASIX ) injection 40 mg (40 mg Intravenous Given 02/21/24 0120)    Mobility walks     Focused Assessments Pulmonary Assessment Handoff:  Lung sounds: Bilateral Breath Sounds: Clear L Breath Sounds: Clear R Breath Sounds: Diminished O2 Device: Room Air      R Recommendations: See Admitting Provider Note  Report given to: Amina, RN  Additional Notes:

## 2024-02-21 NOTE — Plan of Care (Signed)

## 2024-02-21 NOTE — H&P (Addendum)
 [facility] H&P   Patient Demographics:    Jason Jarvis, is a 73 y.o. male  MRN: [id number]   DOB - [birth year]  Admit Date - 02/20/2024  Outpatient Primary MD for the patient is Arlon Bergamo, MD  Outpatient Specialists: Dr Carolynne Citron  Patient coming from:  Home  Chief Complaint  Patient presents with   Shortness of Breath      HPI:    Jason Jarvis  is a 73 y.o. 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  Patient with above history who follows with Dr. Jalene Mayor, EP at [facility] who was in his usual state of health until about yesterday morning when he started developing gradually progressive shortness of breath, he also noticed worsening swelling in his legs for the last 2 to 3 days, he was also having some problems laying flat and was feeling better sitting up, he had a dry cough, no exposure to sick contacts, no chest pain, no palpitations.  His shortness of breath got progressively worse so he presented to med [facility] ER where he was diagnosed with CHF and transferred here for admission.  His workup was consistent with acute on chronic diastolic CHF, he had nonacute CTA chest, chest x-ray and EKG.  Currently decides above dictated shortness of breath patient denies any headache, no chest pain, no palpitations, no fever chills, no diarrhea or dysuria, no blood in stool or urine, no focal weakness.   After he received some Lasix  in the ER he is already feeling significantly better.    Review of systems:    A full 10 point Review of Systems was done, except as stated above, all other Review of Systems were negative.   With Past History of the following :    Past Medical History:  Diagnosis Date   Acute metabolic encephalopathy 12/22/2021   Arm DVT (deep venous thromboembolism), acute (HCC)    Bleeding from wound 01/12/2022   Brachial plexopathy 05/14/2015   Cellulitis of middle toe 02/25/2016   Cervical radiculopathy at C8 05/10/2015   Chronic kidney disease    Cord  compression (HCC) 05/10/2015   Coronary artery disease    Deep vein thrombosis (DVT) of distal vein of left lower extremity of indeterminate age [facility]) 12/29/2021   Diabetic ulcer of toe of left foot associated with diabetes mellitus due to underlying condition, with fat layer exposed (HCC) 07/04/2021   DVT of axillary vein, acute right (HCC) 10/25/2017   Empyema of gallbladder 10/24/2017   GIB (gastrointestinal bleeding) 10/20/2017   History of kidney cancer 02/10/2019   Hypercholesterolemia 04/22/2016   Hypertension    Iron  deficiency anemia due to chronic blood loss 07/29/2017   Formatting of this note might be different from the original. Added automatically from request for surgery [id number]   Metatarsalgia of left foot 08/21/2015   Metatarsalgia of right foot 07/03/2015   MRSA bacteremia 06/18/2017   MVP (mitral valve prolapse) 06/10/2013   Myocardial infarction [facility])    pt states that he had a light heart attack about 25 yrs ago   Pacemaker    Pacemaker pocket hematoma 01/12/2022   Pneumonia    Renal cell carcinoma of right kidney (HCC) 06/09/2019   Right renal mass 03/31/2016   S/P placement of cardiac pacemaker    Scrotal pain 12/22/2021   Severe sepsis (HCC) 12/22/2021   Snoring 08/25/2019   Stroke (HCC)    approx 15 yrs ago      Past Surgical History:  Procedure Laterality Date   BUBBLE  STUDY  12/26/2021   Procedure: BUBBLE STUDY;  Surgeon: Euell Herrlich, MD;  Location: [facility] ENDOSCOPY;  Service: Cardiovascular;;   IR GENERIC HISTORICAL  04/22/2016   IR RADIOLOGIST EVAL & MGMT 04/22/2016 Lucinda Saber, MD [facility] INTERV RAD   IR RADIOLOGIST EVAL & MGMT  05/18/2019   IR RADIOLOGIST EVAL & MGMT  07/06/2019   IR RADIOLOGIST EVAL & MGMT  10/26/2019   IR RADIOLOGIST EVAL & MGMT  10/30/2020   IR RADIOLOGIST EVAL & MGMT  11/01/2021   PACEMAKER IMPLANT  05/2017   PACEMAKER LEAD REMOVAL N/A 12/29/2021   Procedure: PACEMAKER LEAD REMOVAL;  Surgeon: Tammie Fall, MD;  Location: [facility] OR;  Service: Cardiovascular;  Laterality: N/A;   PACEMAKER REMOVAL  07/2017   TEE WITHOUT CARDIOVERSION N/A 12/26/2021   Procedure: TRANSESOPHAGEAL ECHOCARDIOGRAM (TEE);  Surgeon: Euell Herrlich, MD;  Location: [facility] ENDOSCOPY;  Service: Cardiovascular;  Laterality: N/A;      Social History:     Social History   Tobacco Use   Smoking status: Never   Smokeless tobacco: Never  Substance Use Topics   Alcohol use: No    Alcohol/week: 0.0 standard drinks of alcohol         Family History :    History reviewed. No pertinent family history.     Home Medications:   Prior to Admission medications   Medication Sig Start Date End Date Taking? Authorizing Provider  amLODipine  (NORVASC ) 5 MG tablet Take 1 tablet (5 mg total) by mouth daily. 10/12/23   Thomasena Fleming, NP  apixaban  (ELIQUIS ) 5 MG TABS tablet Take 1 tablet (5 mg total) by mouth 2 (two) times daily. 10/12/23   Thomasena Fleming, NP  apixaban  (ELIQUIS ) 5 MG TABS tablet Take 1 tablet (5 mg total) by mouth 2 (two) times daily. 10/12/23   Thomasena Fleming, NP  atorvastatin  (LIPITOR) 40 MG tablet Take 40 mg by mouth daily.    [provider]  carisoprodol (SOMA) 350 MG tablet Take 350 mg by mouth at bedtime as needed for muscle spasms.  [provider]  dexamethasone  (DECADRON ) 6 MG tablet Take 1 tablet (6 mg total) by mouth 2 (two)  times daily with a meal. 11/01/23   Orvilla Blander, MD  doxazosin  (CARDURA ) 2 MG tablet Take 2 mg by mouth at bedtime.    [provider]  furosemide  (LASIX ) 40 MG tablet Take 40 mg by mouth daily.    [provider]  IFEREX 150 150 MG capsule Take 150 mg by mouth at bedtime. 10/07/21   [provider]  Insulin  Aspart Prot & Aspart (NOVOLOG  MIX 70/30 Rudolph) Inject 20-30 Units into the skin every morning. 30 units in the morning, 20 units at bedtime    [provider]  lidocaine  (LIDODERM ) 5 % Place 1 patch onto the skin daily. Remove & Discard patch within 12 hours or as directed by MD 03/08/22   Coretha Dew, PA-C  losartan  (COZAAR ) 100 MG tablet Take 1 tablet (100 mg total) by mouth daily. 01/14/22   Santana Cue, MD  methocarbamol  (ROBAXIN ) 500 MG tablet Take 1 tablet (500 mg total) by mouth 2 (two) times daily. 03/08/22   Coretha Dew, PA-C  Multiple Vitamins-Minerals (CENTRUM SILVER ADULT 50+ PO) Take 1 tablet by mouth daily.    [provider]  nitroGLYCERIN  (NITROSTAT ) 0.4 MG SL tablet Place 0.4 mg under the tongue every 5 (five) minutes as needed for chest pain.    [provider]  ondansetron  (ZOFRAN ) 4 MG tablet Take 1 tablet (4 mg total) by mouth every 6 (six) hours. 06/10/23   Rolinda Climes, DO  oxyCODONE  (ROXICODONE ) 5 MG immediate release tablet Take 1 tablet (5 mg total) by mouth every 4 (four) hours as needed for severe pain. 04/25/22   Molpus, Autry Legions, MD  oxyCODONE  (ROXICODONE ) 5 MG immediate release tablet Take 1 tablet (5 mg total) by mouth every 8 (eight) hours as needed for up to 15 doses for severe pain (pain score 7-10). 09/19/23   Arvilla Birmingham, MD  pantoprazole  (PROTONIX ) 40 MG tablet Take 40 mg by mouth 2 (two) times daily. 11/30/21   [provider]  potassium chloride  (K-DUR) 10 MEQ tablet Take 30 mEq by mouth daily.    [provider]  senna-docusate (SENOKOT-S) 8.6-50 MG tablet Take 1 tablet by mouth  at bedtime as needed for up to 30 doses for mild constipation. Take as needed while taking oxycodone  (opioids) to prevent constipation 09/19/23   Trifan, Janalyn Me, MD  SYMBICORT 160-4.5 MCG/ACT inhaler Inhale 2 puffs into the lungs in the morning and at bedtime. 12/19/21   [provider]  triamcinolone cream (KENALOG) 0.1 % Apply 1 application. topically 2 (two) times daily as needed (itching). 12/14/21   [provider]     Allergies:     Allergies  Allergen Reactions   Ativan  [Lorazepam ] Other (See Comments)    Hallucinated / Confused / Combative   Keflex [Cephalexin] Hives   Ace Inhibitors Cough        Prednisone Rash   Sulfa Antibiotics Rash     Physical Exam:   Vitals  Blood pressure (!) 170/90, pulse 67, temperature 98.1 F (36.7 C), temperature source Oral, resp. rate (!) 22, height 5' 9 (1.753 m), weight 96.6 kg, SpO2 95%.   1. General Elderly Caucasian male sitting up in hospital bed eating [employer] fries and burger but in no distress,  2. Normal affect and insight, Not Suicidal or Homicidal, Awake Alert, chronic bilateral hearing loss  3. No F.N  deficits, ALL C.Nerves Intact, Strength 5/5 all 4 extremities, Sensation intact all 4 extremities, Plantars down going.  4. Ears and Eyes appear Normal, Conjunctivae clear, PERRLA. Moist Oral Mucosa.  5. Supple Neck, No JVD, No cervical lymphadenopathy appriciated, No Carotid Bruits.  6. Symmetrical Chest wall movement, Good air movement bilaterally, few bibasilar crackles  7. RRR, No Gallops, Rubs or Murmurs, No Parasternal Heave.  8. Positive Bowel Sounds, Abdomen Soft, No tenderness, No organomegaly appriciated,No rebound -guarding or rigidity.  9.  No Cyanosis, Normal Skin Turgor, No Skin Rash or Bruise.  Trace bilateral lower extremity edema  10. Good muscle tone,  joints appear normal , no effusions, Normal ROM.  11. No Palpable Lymph Nodes in Neck or Axillae      Data Review:   Recent  Labs  Lab 02/20/24 2239  WBC 9.8  HGB 9.6*  HCT 31.0*  PLT 208  MCV 79.9*  MCH 24.7*  MCHC 31.0  RDW 15.2  LYMPHSABS 2.1  MONOABS 0.9  EOSABS 0.3  BASOSABS 0.1    Recent Labs  Lab 02/20/24 2239  NA 139  K 3.6  CL 102  CO2 24  ANIONGAP 12  GLUCOSE 195*  BUN 11  CREATININE 1.15  AST 20  ALT 11  ALKPHOS 131*  BILITOT 0.5  ALBUMIN 4.2  LATICACIDVEN 1.7  CALCIUM  8.7*    No results found for: CHOL, HDL, LDLCALC, LDLDIRECT, TRIG, CHOLHDL  Recent Labs  Lab 02/20/24 2239  LATICACIDVEN 1.7  CALCIUM  8.7*    Recent Labs  Lab 02/20/24 2239  WBC 9.8  PLT 208  LATICACIDVEN 1.7  CREATININE 1.15    Urinalysis    Component Value Date/Time   COLORURINE YELLOW 04/25/2023 1630   APPEARANCEUR CLEAR 04/25/2023 1630   LABSPEC 1.015 04/25/2023 1630   PHURINE 7.0 04/25/2023 1630   GLUCOSEU NEGATIVE 04/25/2023 1630   HGBUR TRACE (A) 04/25/2023 1630   BILIRUBINUR NEGATIVE 04/25/2023 1630   KETONESUR NEGATIVE 04/25/2023 1630   PROTEINUR NEGATIVE 04/25/2023 1630   NITRITE NEGATIVE 04/25/2023 1630   LEUKOCYTESUR NEGATIVE 04/25/2023 1630      Imaging Results:    CT Angio Chest PE W and/or Wo Contrast Result Date: 02/21/2024 CLINICAL DATA:  Shortness of breath onset this morning worsening throughout the day. EXAM: CT ANGIOGRAPHY CHEST WITH CONTRAST TECHNIQUE: Multidetector CT imaging of the chest was performed using the standard protocol during bolus administration of intravenous contrast. Multiplanar CT image reconstructions and MIPs were obtained to evaluate the vascular anatomy. RADIATION DOSE REDUCTION: This exam was performed according to the departmental dose-optimization program which includes automated exposure control, adjustment of the mA and/or kV according to patient size and/or use of iterative reconstruction technique. CONTRAST:  75mL OMNIPAQUE  IOHEXOL  350 MG/ML SOLN COMPARISON:  Portable chest earlier today, AP Lat chest 11/01/2023, CTA chest  06/10/2023, CTA chest 04/10/2018. FINDINGS: Cardiovascular: Arterial opacification is diagnostic. The pulmonary trunk is prominent measuring 3.2 cm compatible with arterial hypertension, previously 3.0 cm. No arterial embolic filling defect is seen. There is mild cardiomegaly with again noted left ventricular wall and septal hypertrophy somewhat narrowing the left ventricular outflow tract but no more than previously. The aorta is normal in caliber and course. There is mild atherosclerosis in the aorta and great vessels without aneurysm or dissection. There is no pericardial effusion. There are three-vessel scattered coronary artery calcific plaques. Normal pulmonary veins. Mediastinum/Nodes: No lymphadenopathy. Negative thyroid gland, thoracic trachea, main bronchi, thoracic esophagus. Lungs/Pleura: There are trace pleural effusions. Calcified granuloma right lower  lobe. There is mild bronchial thickening in the lower lobes but no visible bronchial impactions or bronchiectasis. Posterior atelectasis is again noted in the lower lobes and mosaicism in the lower lung fields fall the latter most likely due to small airways disease and air trapping. No pulmonary consolidation or suspicious nodules are seen. No pulmonary fibrosis. Stable 7 mm ovoid pleural based nodule again noted medial right middle lobe on 303:64, with almost 6 years of stability consistent with a benign entity. Upper Abdomen: No acute abnormality. Musculoskeletal: There is kyphosis and degenerative change of the spine. No acute or other significant osseous findings. Unremarkable visualized chest wall. Review of the MIP images confirms the above findings. IMPRESSION: 1. No evidence of arterial embolus. 2. Prominent pulmonary trunk 3.2 cm compatible with arterial hypertension. 3. Mild cardiomegaly with left ventricular wall and septal hypertrophy somewhat narrowing the left ventricular outflow tract but no more than previously. 4. Aortic and coronary  artery atherosclerosis. 5. Trace pleural effusions with mild bronchial thickening in the lower lobes. 6. Mosaicism in the lower lung fields most likely due to small airways disease and air trapping. No focal infiltrate. 7. Stable 7 mm right middle lobe nodule for greater than 5 years consistent with a benign entity. Aortic Atherosclerosis (ICD10-I70.0). Electronically Signed   By: Denman Fischer M.D.   On: 02/21/2024 00:16   DG Chest Port 1 View Result Date: 02/20/2024 CLINICAL DATA:  Shortness of breath and cough. Onset this morning worsening throughout the day. EXAM: PORTABLE CHEST 1 VIEW COMPARISON:  AP Lat chest 11/01/2023 FINDINGS: There is a low inspiration on exam limiting visualization of the bases. The visualized hypoinflated lungs are generally clear. There may be a minimal left pleural effusion. The right sulci are sharp. There is mild cardiomegaly without evidence of CHF. Aortic atherosclerosis with stable mediastinum. Thoracic spondylosis with no new osseous findings. Overlying telemetry leads. IMPRESSION: 1. Low inspiration limiting visualization of the bases. 2. Possible minimal left pleural effusion. The visualized lungs otherwise generally clear. 3. Mild cardiomegaly without evidence of CHF. 4. Aortic atherosclerosis. Electronically Signed   By: Denman Fischer M.D.   On: 02/20/2024 22:36    My personal review of EKG: Rhythm, bifascicular block, no acute ST changes   Assessment & Plan:    1.  Acute on chronic diastolic CHF EF 55 to 60% on last echocardiogram in 2023.  He has been admitted to the hospital on a telemetry bed, will be placed on salt and fluid restriction, will be placed on IV Lasix , readdress Lasix  dose tomorrow, continue home dose ARB and Norvasc , blood pressure in slightly poor control hence will add hydralazine  and Imdur.  Will check echocardiogram.   Avoiding beta-blocker due to history of sick sinus syndrome, monitor intake output, electrolytes and clinically.  Likely  home in 1 to 2 days.  His mildly elevated troponin is flat in trend & non-ACS and pattern, demand ischemia from CHF.  No chest pain, nonacute EKG.  No further workup for this.   2.  Hypertension.  In poor control.  Adjustments made as above.  3.  Dyslipidemia.  Continue home dose statin.  4.  GERD.  On PPI twice daily.  Continue.  5.  History of sick sinus syndrome, s/p pacemaker placement complicated by pacemaker infection requiring pacemaker removal in 2023.  Stable.  Avoid rate controlling agents, monitor on telemetry.  6.  DM type II.  Home 70/30 continued sliding scale added, sugars slightly in poor control as he received some  steroid in the ER earlier today.  Monitor sugars and adjust as needed.  A1c pending.    DVT Prophylaxis requests  AM Labs Ordered, also please review Full Orders  Family Communication: Admission, patients condition and plan of care including tests being ordered have been discussed with the patient and wife who indicate understanding and agree with the plan and Code Status.  Code Status full code for short-term, does not want to be on a life support for prolonged.'s of time  Likely DC to home in 2 to 3 days  Condition Fair  Consults called: None    Admission status: Obs   Time spent in minutes : 35  Signature  -    Lynnwood Sauer M.D on 02/21/2024 at 4:56 PM   -  To page go to [email or web address]

## 2024-02-21 NOTE — ED Notes (Signed)
 Attempt to call report x 1

## 2024-02-21 NOTE — ED Notes (Addendum)
 Patient's wife, Bearl Limes, requests update when patient is transferred.   [phone]

## 2024-02-21 NOTE — ED Notes (Signed)
 Called [facility] Mariah Shines) for transport 2:49p

## 2024-02-21 NOTE — Progress Notes (Signed)
 Hospitalist Transfer Note:    Nursing staff, Please call [facility] Admits & Consults System-Wide number on Amion [phone]) as soon as patient's arrival, so appropriate admitting provider can evaluate the pt.   Transferring facility: [facility] Requesting provider: Dr. Candelaria Chaco (EDP at [facility]) Reason for transfer: admission for further evaluation and management of acute on chronic diastolic heart failure.    73 year old male with history of chronic diastolic heart failure, who presented to [facility] ED complaining of 3 to 4 days of progressive shortness of breath associated with worsening of edema in the bilateral lower extremities.  Not associate with any chest pain.  He is reported to have recently undergone hip replacement.  Is reported to have a history of chronic diastolic heart failure, with most recent echocardiogram performed in April 2023, which was notable for LVEF 50% as well as grade 1 diastolic dysfunction.  He was previously prescribed Lasix  40 mg p.o. daily, although it is unclear at this time if he has continued more recently to take this medication.  Vital signs in the ED were notable for the following: Afebrile: Respiratory rate in the mid 20s, oxygen saturation 94 to 98% on room air.  Systolic blood pressures in the 160s to 170s mmHg.   Labs were notable for BNP of 500.  Initial high-sensitivity troponin I was 39, with repeat value trending down to 36.  EKG is reported to show sinus rhythm without overt evidence of acute ischemic changes.  Imaging notable for CTA chest showed no acute pulmonary embolism nor any evidence of significant pulmonary edema or infiltrate.  Medications administered prior to transfer included the following: Lasix  40 mg IV x 1 dose.  Subsequently, I accepted this patient for transfer for observation to a cardiac telemetry bed at [facility] for further work-up and management of the above.      Camelia Cavalier, DO Hospitalist

## 2024-02-21 NOTE — ED Notes (Signed)
[facility] at bedside to assume care of patient. 

## 2024-02-22 ENCOUNTER — Observation Stay (HOSPITAL_BASED_OUTPATIENT_CLINIC_OR_DEPARTMENT_OTHER)

## 2024-02-22 DIAGNOSIS — I509 Heart failure, unspecified: Secondary | ICD-10-CM | POA: Diagnosis not present

## 2024-02-22 DIAGNOSIS — I5033 Acute on chronic diastolic (congestive) heart failure: Secondary | ICD-10-CM | POA: Diagnosis not present

## 2024-02-22 LAB — CBC WITH DIFFERENTIAL/PLATELET
Abs Immature Granulocytes: 0.06 10*3/uL (ref 0.00–0.07)
Basophils Absolute: 0 10*3/uL (ref 0.0–0.1)
Basophils Relative: 0 %
Eosinophils Absolute: 0 10*3/uL (ref 0.0–0.5)
Eosinophils Relative: 0 %
HCT: 32.3 % — ABNORMAL LOW (ref 39.0–52.0)
Hemoglobin: 9.7 g/dL — ABNORMAL LOW (ref 13.0–17.0)
Immature Granulocytes: 1 %
Lymphocytes Relative: 15 %
Lymphs Abs: 1.6 10*3/uL (ref 0.7–4.0)
MCH: 24.3 pg — ABNORMAL LOW (ref 26.0–34.0)
MCHC: 30 g/dL (ref 30.0–36.0)
MCV: 80.8 fL (ref 80.0–100.0)
Monocytes Absolute: 1.1 10*3/uL — ABNORMAL HIGH (ref 0.1–1.0)
Monocytes Relative: 11 %
Neutro Abs: 7.9 10*3/uL — ABNORMAL HIGH (ref 1.7–7.7)
Neutrophils Relative %: 73 %
Platelets: 232 10*3/uL (ref 150–400)
RBC: 4 MIL/uL — ABNORMAL LOW (ref 4.22–5.81)
RDW: 15.5 % (ref 11.5–15.5)
WBC: 10.7 10*3/uL — ABNORMAL HIGH (ref 4.0–10.5)
nRBC: 0 % (ref 0.0–0.2)

## 2024-02-22 LAB — BRAIN NATRIURETIC PEPTIDE: B Natriuretic Peptide: 196.3 pg/mL — ABNORMAL HIGH (ref 0.0–100.0)

## 2024-02-22 LAB — ECHOCARDIOGRAM COMPLETE
Area-P 1/2: 4.06 cm2
Calc EF: 77.3 %
Height: 69 in
S' Lateral: 2.4 cm
Single Plane A2C EF: 75.2 %
Single Plane A4C EF: 79.5 %
Weight: 3315.72 [oz_av]

## 2024-02-22 LAB — GLUCOSE, CAPILLARY
Glucose-Capillary: 196 mg/dL — ABNORMAL HIGH (ref 70–99)
Glucose-Capillary: 181 mg/dL — ABNORMAL HIGH (ref 70–99)
Glucose-Capillary: 150 mg/dL — ABNORMAL HIGH (ref 70–99)
Glucose-Capillary: 152 mg/dL — ABNORMAL HIGH (ref 70–99)

## 2024-02-22 LAB — BASIC METABOLIC PANEL WITH GFR
Anion gap: 12 (ref 5–15)
BUN: 24 mg/dL — ABNORMAL HIGH (ref 8–23)
CO2: 24 mmol/L (ref 22–32)
Calcium: 9.4 mg/dL (ref 8.9–10.3)
Chloride: 106 mmol/L (ref 98–111)
Creatinine, Ser: 1.56 mg/dL — ABNORMAL HIGH (ref 0.61–1.24)
GFR, Estimated: 46 mL/min — ABNORMAL LOW (ref >60)
Glucose, Bld: 177 mg/dL — ABNORMAL HIGH (ref 70–99)
Potassium: 3.9 mmol/L (ref 3.5–5.1)
Sodium: 142 mmol/L (ref 135–145)

## 2024-02-22 LAB — MAGNESIUM: Magnesium: 2.1 mg/dL (ref 1.7–2.4)

## 2024-02-22 MED ORDER — PREDNISONE 10 MG PO TABS
40.0000 mg | ORAL_TABLET | Freq: Every day | ORAL | 0 refills | Status: AC
Start: 2024-02-22 — End: 2024-02-27

## 2024-02-22 MED ORDER — PERFLUTREN LIPID MICROSPHERE
1.0000 mL | INTRAVENOUS | Status: AC | PRN
  Administered 2024-02-22: 1 mL via INTRAVENOUS

## 2024-02-22 MED ORDER — HYDRALAZINE HCL 20 MG/ML IJ SOLN: 10.0000 mg | Freq: Four times a day (QID) | INTRAMUSCULAR | Status: DC | PRN

## 2024-02-22 NOTE — Discharge Summary (Signed)
 Jason Korver Beamer Sr. ZOX:[id number] DOB: 30-Mar-1951 DOA: 02/20/2024  PCP: Arlon Bergamo, MD  Admit date: 02/20/2024 Discharge date: 02/22/2024  Time spent: 35 minutes  Recommendations for Outpatient Follow-up:  Pcp f/u 1 week Cardiology f/u  Check bmp at f/u    Discharge Diagnoses:  Principal Problem:   Acute on chronic diastolic heart failure [facility])   Discharge Condition: improved  Diet recommendation: heart healthy  Filed Weights   02/20/24 2145 02/22/24 0409  Weight: 96.6 kg 94 kg    History of present illness:  From admission h and p Ashawn Rinehart  is a 73 y.o. 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   Patient with above history who follows with Dr. Jalene Mayor, EP at [facility] who was in his usual state of health until about yesterday morning when he started developing gradually progressive shortness of breath, he also noticed worsening swelling in his legs for the last 2 to 3 days, he was also having some problems laying flat and was feeling better sitting up, he had a dry cough, no exposure to sick contacts, no chest pain, no palpitations.  His shortness of breath got progressively worse so he presented to med [facility] ER where he was diagnosed with CHF and transferred here for admission.  His workup was consistent with acute on chronic diastolic CHF, he had nonacute CTA chest, chest x-ray and EKG.   Currently decides above dictated shortness of breath patient denies any headache, no chest pain, no palpitations, no fever chills, no diarrhea or dysuria, no blood in stool or urine, no focal weakness.  After he received some Lasix  in the ER he is already  feeling significantly better.  Hospital Course:  Patient presents with shortness of breath. Bnp found to be elevated. Also with wheezing and lower extremity edema. Admitted for chf exacerbation, treated with IV lasix . Also received IV corticosteroids. On hospital day 1 patient feeling generally improved. Edema resolved. Wheezing resolved. TTE shows grade 1 dd, no significant abnormalities. CTA no PE or other acute process. Suspect mild chf exacerbation with possible copd exacerbation. Up-trend in bun and cr so held further diuresis, advise resume home lasix  tomorrow and f/u with pcp or cardiology in one week, check bmp then. Also ordering a 5 day course of prednisone, hyperglycemia precautions provided. Evaluated by PT, no significant concerns identified. Other chronic medical problems stable. Reviewed d/c plan with wife who is in agreement.   Procedures: none   Consultations: none  Discharge Exam: Vitals:   02/22/24 1326 02/22/24 1623  BP: (!) 140/60 (!) 142/68  Pulse:  91  Resp:  20  Temp:  98.7 F (37.1 C)  SpO2:  91%    General: NAD Cardiovascular: RRR Respiratory: CTAB Ext: warm, no edema  Discharge Instructions   Discharge Instructions     Ambulatory referral to Physical Therapy   Complete by: As directed    Diet - low sodium heart healthy   Complete by: As directed    Increase activity slowly   Complete by: As directed       Allergies as of 02/22/2024       Reactions   Ativan  [lorazepam ] Other (See Comments)   Hallucinated / Confused / Combative   Keflex [cephalexin] Hives  Ace Inhibitors Cough      Prednisone Rash   Sulfa Antibiotics Rash        Medication List     TAKE these medications    acetaminophen  500 MG tablet Commonly known as: TYLENOL  Take 1,000 mg by mouth every 8 (eight) hours as needed for moderate pain (pain score 4-6).   albuterol  108 (90 Base) MCG/ACT inhaler Commonly known as: VENTOLIN  HFA Inhale 1 puff into the lungs every 6  (six) hours as needed for wheezing or shortness of breath.   amLODipine  5 MG tablet Commonly known as: NORVASC  Take 1 tablet (5 mg total) by mouth daily.   apixaban  5 MG Tabs tablet Commonly known as: ELIQUIS  Take 1 tablet (5 mg total) by mouth 2 (two) times daily.   atorvastatin  40 MG tablet Commonly known as: LIPITOR Take 40 mg by mouth every evening.   Breo Ellipta 200-25 MCG/ACT Aepb Generic drug: fluticasone furoate-vilanterol Inhale 1 puff into the lungs daily.   carisoprodol 350 MG tablet Commonly known as: SOMA Take 350 mg by mouth at bedtime as needed for muscle spasms.   CENTRUM SILVER ADULT 50+ PO Take 1 tablet by mouth daily.   doxazosin  2 MG tablet Commonly known as: CARDURA  Take 2 mg by mouth at bedtime.   escitalopram 20 MG tablet Commonly known as: LEXAPRO Take 20 mg by mouth daily.   furosemide  40 MG tablet Commonly known as: LASIX  Take 40 mg by mouth 2 (two) times daily.   gabapentin 600 MG tablet Commonly known as: NEURONTIN Take 600-1,200 mg by mouth See admin instructions. Take 600mg  by mouth in the morning and 1200mg  by mouth in the evening.   IFerex 150 150 MG capsule Generic drug: iron  polysaccharides Take 300 mg by mouth at bedtime.   insulin  NPH-regular Human (70-30) 100 UNIT/ML injection Inject 20-30 Units into the skin See admin instructions. Inject 30 units subcutaneously in the morning and 20 units at night.   losartan  100 MG tablet Commonly known as: COZAAR  Take 1 tablet (100 mg total) by mouth daily. What changed:  how much to take when to take this   meloxicam 7.5 MG tablet Commonly known as: MOBIC Take 7.5 mg by mouth daily.   nitroGLYCERIN  0.4 MG SL tablet Commonly known as: NITROSTAT  Place 0.4 mg under the tongue every 5 (five) minutes as needed for chest pain.   pantoprazole  40 MG tablet Commonly known as: PROTONIX  Take 40 mg by mouth 2 (two) times daily.   potassium chloride  10 MEQ tablet Commonly known as:  KLOR-CON  Take 30 mEq by mouth daily.   pramipexole 0.25 MG tablet Commonly known as: MIRAPEX Take 0.25 mg by mouth at bedtime.   predniSONE 10 MG tablet Commonly known as: DELTASONE Take 4 tablets (40 mg total) by mouth daily for 5 days.   triamcinolone cream 0.1 % Commonly known as: KENALOG Apply 1 application. topically 2 (two) times daily as needed (itching).   Vitamin D (Ergocalciferol) 1.25 MG (50000 UNIT) Caps capsule Commonly known as: DRISDOL Take 50,000 Units by mouth once a week. On Wednesday.       Allergies  Allergen Reactions   Ativan  [Lorazepam ] Other (See Comments)    Hallucinated / Confused / Combative   Keflex [Cephalexin] Hives   Ace Inhibitors Cough        Prednisone Rash   Sulfa Antibiotics Rash    Follow-up Information     [facility]. Schedule an appointment as soon as possible for a visit.  Why: call to schedule apt for physical therapy Contact information: [phone]        Arlon Bergamo, MD Follow up.   Specialty: Internal Medicine Contact information: [address] [place] [address] [phone]         Tammie Fall, MD Follow up.   Specialty: Cardiology Contact information: [address] [place] [address] [phone]                  The results of significant diagnostics from this hospitalization (including imaging, microbiology, ancillary and laboratory) are listed below for reference.    Significant Diagnostic Studies: ECHOCARDIOGRAM COMPLETE Result Date: 02/22/2024    ECHOCARDIOGRAM REPORT   Patient Name:   Jason Marengo Shehadeh Sr. Date of Exam: 02/22/2024 Medical Rec #:  [id number]           Height:       69.0 in Accession #:    [id number]          Weight:       207.2 lb Date of Birth:  [birth year]           BSA:          2.097 m Patient Age:    73 years            BP:           148/63 mmHg Patient Gender: M                   HR:           77 bpm. Exam Location:  Inpatient  Procedure: 2D Echo, Cardiac Doppler, Color Doppler and Intracardiac            Opacification Agent (Both Spectral and Color Flow Doppler were            utilized during procedure). Indications:    CHF  History:        Patient has prior history of Echocardiogram examinations. CHF;                 Risk Factors:Hypertension.  Sonographer:    Farrell Honey Key Referring Phys: Emelda Hane [facility] IMPRESSIONS  1. Left ventricular ejection fraction, by estimation, is 70 to 75%. The left ventricle has hyperdynamic function. The left ventricle has no regional wall motion abnormalities. There is severe asymmetric left ventricular hypertrophy. Left ventricular diastolic parameters are consistent with Grade I diastolic dysfunction (impaired relaxation).  2. Right ventricular systolic function is normal. The right ventricular size is normal.  3. Left atrial size was severely dilated.  4. The mitral valve is normal in structure. No evidence of mitral valve regurgitation. No evidence of mitral stenosis.  5. The aortic valve is tricuspid. Aortic valve regurgitation is not visualized. No aortic stenosis is present.  6. The inferior vena cava is normal in size with greater than 50% respiratory variability, suggesting right atrial pressure of 3 mmHg. FINDINGS  Left Ventricle: Left ventricular ejection fraction, by estimation, is 70 to 75%. The left ventricle has hyperdynamic function. The left ventricle has no regional wall motion abnormalities. The left ventricular internal cavity size was normal in size. There is severe asymmetric left ventricular hypertrophy. Left ventricular diastolic parameters are consistent with Grade I diastolic dysfunction (impaired relaxation). Normal left ventricular filling pressure. Right Ventricle: The right ventricular size is normal. No increase in right ventricular wall thickness. Right ventricular systolic function is normal. Left Atrium: Left atrial size was severely dilated. Right Atrium: Right atrial  size was normal in size. Pericardium: There is no evidence of pericardial effusion. Mitral Valve: The mitral valve is normal in structure. No evidence of mitral valve regurgitation. No evidence of mitral valve stenosis. Tricuspid Valve: The tricuspid valve is normal in structure. Tricuspid valve regurgitation is not demonstrated. No evidence of tricuspid stenosis. Aortic Valve: The aortic valve is tricuspid. Aortic valve regurgitation is not visualized. No aortic stenosis is present. Pulmonic Valve: The pulmonic valve was normal in structure. Pulmonic valve regurgitation is trivial. No evidence of pulmonic stenosis. Aorta: The aortic root is normal in size and structure. Venous: The inferior vena cava is normal in size with greater than 50% respiratory variability, suggesting right atrial pressure of 3 mmHg. IAS/Shunts: No atrial level shunt detected by color flow Doppler.  LEFT VENTRICLE PLAX 2D LVIDd:         4.50 cm      Diastology LVIDs:         2.40 cm      LV e' medial:    8.92 cm/s LV PW:         1.70 cm      LV E/e' medial:  7.6 LV IVS:        1.70 cm      LV e' lateral:   5.98 cm/s LVOT diam:     2.00 cm      LV E/e' lateral: 11.3 LV SV:         73 LV SV Index:   35 LVOT Area:     3.14 cm  LV Volumes (MOD) LV vol d, MOD A2C: 103.6 ml LV vol d, MOD A4C: 126.0 ml LV vol s, MOD A2C: 25.7 ml LV vol s, MOD A4C: 25.8 ml LV SV MOD A2C:     77.9 ml LV SV MOD A4C:     126.0 ml LV SV MOD BP:      88.9 ml RIGHT VENTRICLE RV S prime:     22.40 cm/s TAPSE (M-mode): 2.8 cm LEFT ATRIUM           Index        RIGHT ATRIUM           Index LA diam:      4.30 cm 2.05 cm/m   RA Area:     21.60 cm LA Vol (A2C): 97.5 ml 46.49 ml/m  RA Volume:   61.20 ml  29.18 ml/m LA Vol (A4C): 90.1 ml 42.96 ml/m  AORTIC VALVE LVOT Vmax:   139.00 cm/s LVOT Vmean:  87.700 cm/s LVOT VTI:    0.233 m  AORTA Ao Root diam: 3.80 cm Ao Asc diam:  3.80 cm MITRAL VALVE MV Area (PHT): 4.06 cm    SHUNTS MV Decel Time: 187 msec    Systemic VTI:  0.23  m MV E velocity: 67.60 cm/s  Systemic Diam: 2.00 cm MV A velocity: 91.70 cm/s MV E/A ratio:  0.74 Maudine Sos MD Electronically signed by Maudine Sos MD Signature Date/Time: 02/22/2024/10:49:58 AM    Final    CT Angio Chest PE W and/or Wo Contrast Result Date: 02/21/2024 CLINICAL DATA:  Shortness of breath onset this morning worsening throughout the day. EXAM: CT ANGIOGRAPHY CHEST WITH CONTRAST TECHNIQUE: Multidetector CT imaging of the chest was performed using the standard protocol during bolus administration of intravenous contrast. Multiplanar CT image reconstructions and MIPs were obtained to evaluate the vascular anatomy. RADIATION DOSE REDUCTION: This exam was performed according to the departmental dose-optimization program which includes automated exposure  control, adjustment of the mA and/or kV according to patient size and/or use of iterative reconstruction technique. CONTRAST:  75mL OMNIPAQUE  IOHEXOL  350 MG/ML SOLN COMPARISON:  Portable chest earlier today, AP Lat chest 11/01/2023, CTA chest 06/10/2023, CTA chest 04/10/2018. FINDINGS: Cardiovascular: Arterial opacification is diagnostic. The pulmonary trunk is prominent measuring 3.2 cm compatible with arterial hypertension, previously 3.0 cm. No arterial embolic filling defect is seen. There is mild cardiomegaly with again noted left ventricular wall and septal hypertrophy somewhat narrowing the left ventricular outflow tract but no more than previously. The aorta is normal in caliber and course. There is mild atherosclerosis in the aorta and great vessels without aneurysm or dissection. There is no pericardial effusion. There are three-vessel scattered coronary artery calcific plaques. Normal pulmonary veins. Mediastinum/Nodes: No lymphadenopathy. Negative thyroid gland, thoracic trachea, main bronchi, thoracic esophagus. Lungs/Pleura: There are trace pleural effusions. Calcified granuloma right lower lobe. There is mild bronchial  thickening in the lower lobes but no visible bronchial impactions or bronchiectasis. Posterior atelectasis is again noted in the lower lobes and mosaicism in the lower lung fields fall the latter most likely due to small airways disease and air trapping. No pulmonary consolidation or suspicious nodules are seen. No pulmonary fibrosis. Stable 7 mm ovoid pleural based nodule again noted medial right middle lobe on 303:64, with almost 6 years of stability consistent with a benign entity. Upper Abdomen: No acute abnormality. Musculoskeletal: There is kyphosis and degenerative change of the spine. No acute or other significant osseous findings. Unremarkable visualized chest wall. Review of the MIP images confirms the above findings. IMPRESSION: 1. No evidence of arterial embolus. 2. Prominent pulmonary trunk 3.2 cm compatible with arterial hypertension. 3. Mild cardiomegaly with left ventricular wall and septal hypertrophy somewhat narrowing the left ventricular outflow tract but no more than previously. 4. Aortic and coronary artery atherosclerosis. 5. Trace pleural effusions with mild bronchial thickening in the lower lobes. 6. Mosaicism in the lower lung fields most likely due to small airways disease and air trapping. No focal infiltrate. 7. Stable 7 mm right middle lobe nodule for greater than 5 years consistent with a benign entity. Aortic Atherosclerosis (ICD10-I70.0). Electronically Signed   By: Denman Fischer M.D.   On: 02/21/2024 00:16   DG Chest Port 1 View Result Date: 02/20/2024 CLINICAL DATA:  Shortness of breath and cough. Onset this morning worsening throughout the day. EXAM: PORTABLE CHEST 1 VIEW COMPARISON:  AP Lat chest 11/01/2023 FINDINGS: There is a low inspiration on exam limiting visualization of the bases. The visualized hypoinflated lungs are generally clear. There may be a minimal left pleural effusion. The right sulci are sharp. There is mild cardiomegaly without evidence of CHF. Aortic  atherosclerosis with stable mediastinum. Thoracic spondylosis with no new osseous findings. Overlying telemetry leads. IMPRESSION: 1. Low inspiration limiting visualization of the bases. 2. Possible minimal left pleural effusion. The visualized lungs otherwise generally clear. 3. Mild cardiomegaly without evidence of CHF. 4. Aortic atherosclerosis. Electronically Signed   By: Denman Fischer M.D.   On: 02/20/2024 22:36    Microbiology: Recent Results (from the past 240 hours)  Resp panel by RT-PCR (RSV, Flu A&B, Covid) Anterior Nasal Swab     Status: None   Collection Time: 02/20/24 10:00 PM   Specimen: Anterior Nasal Swab  Result Value Ref Range Status   SARS Coronavirus 2 by RT PCR NEGATIVE NEGATIVE Final    Comment: (NOTE) SARS-CoV-2 target nucleic acids are NOT DETECTED.  The SARS-CoV-2 RNA is generally  detectable in upper respiratory specimens during the acute phase of infection. The lowest concentration of SARS-CoV-2 viral copies this assay can detect is 138 copies/mL. A negative result does not preclude SARS-Cov-2 infection and should not be used as the sole basis for treatment or other patient management decisions. A negative result may occur with  improper specimen collection/handling, submission of specimen other than nasopharyngeal swab, presence of viral mutation(s) within the areas targeted by this assay, and inadequate number of viral copies(<138 copies/mL). A negative result must be combined with clinical observations, patient history, and epidemiological information. The expected result is Negative.  Fact Sheet for Patients:  BloggerCourse.com  Fact Sheet for Healthcare Providers:  SeriousBroker.it  This test is no t yet approved or cleared by the [place]  FDA and  has been authorized for detection and/or diagnosis of SARS-CoV-2 by FDA under an Emergency Use Authorization (EUA). This EUA will remain  in effect  (meaning this test can be used) for the duration of the COVID-19 declaration under Section 564(b)(1) of the Act, 21 U.S.C.section 360bbb-3(b)(1), unless the authorization is terminated  or revoked sooner.       Influenza A by PCR NEGATIVE NEGATIVE Final   Influenza B by PCR NEGATIVE NEGATIVE Final    Comment: (NOTE) The Xpert Xpress SARS-CoV-2/FLU/RSV plus assay is intended as an aid in the diagnosis of influenza from Nasopharyngeal swab specimens and should not be used as a sole basis for treatment. Nasal washings and aspirates are unacceptable for Xpert Xpress SARS-CoV-2/FLU/RSV testing.  Fact Sheet for Patients: BloggerCourse.com  Fact Sheet for Healthcare Providers: SeriousBroker.it  This test is not yet approved or cleared by the [place]  FDA and has been authorized for detection and/or diagnosis of SARS-CoV-2 by FDA under an Emergency Use Authorization (EUA). This EUA will remain in effect (meaning this test can be used) for the duration of the COVID-19 declaration under Section 564(b)(1) of the Act, 21 U.S.C. section 360bbb-3(b)(1), unless the authorization is terminated or revoked.     Resp Syncytial Virus by PCR NEGATIVE NEGATIVE Final    Comment: (NOTE) Fact Sheet for Patients: BloggerCourse.com  Fact Sheet for Healthcare Providers: SeriousBroker.it  This test is not yet approved or cleared by the [place]  FDA and has been authorized for detection and/or diagnosis of SARS-CoV-2 by FDA under an Emergency Use Authorization (EUA). This EUA will remain in effect (meaning this test can be used) for the duration of the COVID-19 declaration under Section 564(b)(1) of the Act, 21 U.S.C. section 360bbb-3(b)(1), unless the authorization is terminated or revoked.  Performed at [facility], [address] [place], [place] [address]   Blood culture  (routine x 2)     Status: None (Preliminary result)   Collection Time: 02/20/24 10:32 PM   Specimen: BLOOD LEFT WRIST  Result Value Ref Range Status   Specimen Description   Final    BLOOD LEFT WRIST Performed at [facility], [address] [place], [place] [address]    Special Requests   Final    BOTTLES DRAWN AEROBIC AND ANAEROBIC Blood Culture adequate volume Performed at [facility], [address] [place], [place] [address]    Culture   Final    NO GROWTH < 24 HOURS Performed at [facility], [address] [place], [place] [address]    Report Status PENDING  Incomplete  Blood culture (routine x 2)     Status: None (Preliminary result)  Collection Time: 02/20/24 10:34 PM   Specimen: BLOOD  Result Value Ref Range Status   Specimen Description   Final    BLOOD RIGHT ANTECUBITAL Performed at [facility], [address] [place], [place] [address]    Special Requests   Final    BOTTLES DRAWN AEROBIC AND ANAEROBIC Blood Culture adequate volume Performed at [facility], [address] [place], [place] [address]    Culture   Final    NO GROWTH < 24 HOURS Performed at [facility], [address] [place], [place] [address]    Report Status PENDING  Incomplete  MRSA Next Gen by PCR, Nasal     Status: None   Collection Time: 02/21/24  4:22 PM   Specimen: Nasal Mucosa; Nasal Swab  Result Value Ref Range Status   MRSA by PCR Next Gen NOT DETECTED NOT DETECTED Final    Comment: (NOTE) The GeneXpert MRSA Assay (FDA approved for NASAL specimens only), is one component of a comprehensive MRSA colonization surveillance program. It is not intended to diagnose MRSA infection nor to guide or monitor treatment for MRSA infections. Test performance is not FDA approved in patients less than 36 years old. Performed at [facility], [address] [place], [place] [address]      Labs: Basic Metabolic  Panel: Recent Labs  Lab 02/20/24 2239 02/22/24 0252  NA 139 142  K 3.6 3.9  CL 102 106  CO2 24 24  GLUCOSE 195* 177*  BUN 11 24*  CREATININE 1.15 1.56*  CALCIUM  8.7* 9.4  MG  --  2.1   Liver Function Tests: Recent Labs  Lab 02/20/24 2239  AST 20  ALT 11  ALKPHOS 131*  BILITOT 0.5  PROT 6.4*  ALBUMIN 4.2   No results for input(s): LIPASE, AMYLASE in the last 168 hours. No results for input(s): AMMONIA in the last 168 hours. CBC: Recent Labs  Lab 02/20/24 2239 02/22/24 0252  WBC 9.8 10.7*  NEUTROABS 6.4 7.9*  HGB 9.6* 9.7*  HCT 31.0* 32.3*  MCV 79.9* 80.8  PLT 208 232   Cardiac Enzymes: No results for input(s): CKTOTAL, CKMB, CKMBINDEX, TROPONINI in the last 168 hours. BNP: BNP (last 3 results) Recent Labs    11/01/23 0357 02/22/24 0252  BNP 39.4 196.3*    ProBNP (last 3 results) Recent Labs    02/20/24 2239  PROBNP 515.0*    CBG: Recent Labs  Lab 02/21/24 2055 02/22/24 0622 02/22/24 1038 02/22/24 1153 02/22/24 1621  GLUCAP 324* 196* 181* 150* 152*       Signed:  Raymonde Calico MD.  [facility] 02/22/2024, 5:25 PM

## 2024-02-22 NOTE — Plan of Care (Signed)

## 2024-02-22 NOTE — Evaluation (Signed)
 Physical Therapy Evaluation Patient Details Name: Jason Kowal Netz Sr. MRN: [id number] DOB: 06-Nov-1950 Today's Date: 02/22/2024  History of Present Illness  Jason Jarvis  is a 73 y.o. male, admitted to [facility] 6/15 with reports of gradually progressive shortness of breath, he also noticed worsening swelling in his legs for the last 2 to 3 days, he was also having some problems laying flat. 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   Clinical Impression  Patient received in bed, spouse at bedside. Patient is pleasant and agrees to PT assessment. He reports R hip replacement 3 months ago and wore KI after surgery, then had R knee frozen in extension. Has been working with outpatient PT to improve. Patient is mod I with bed mobility, transfers with supervision. Ambulated 120 feet with RW and supervision. Patient will continue to benefit from skilled PT to improve mobility and independence.           If plan is discharge home, recommend the following: A little help with walking and/or transfers;A little help with bathing/dressing/bathroom;Assist for transportation;Help with stairs or ramp for entrance   Can travel by private vehicle    yes    Equipment Recommendations None recommended by PT  Recommendations for Other Services       Functional Status Assessment Patient has had a recent decline in their functional status and demonstrates the ability to make significant improvements in function in a reasonable and predictable amount of time.     Precautions / Restrictions Precautions Precaution/Restrictions Comments: mod fall, recent right hip replacement ( 3 months ago). Posterior hip  precautions Restrictions Weight Bearing Restrictions Per Provider Order: No      Mobility  Bed Mobility Overal bed mobility: Modified Independent                  Transfers Overall transfer level: Modified independent Equipment used: Rolling walker (2 wheels)                    Ambulation/Gait Ambulation/Gait assistance: Supervision Gait Distance (Feet): 120 Feet Assistive device: Rolling walker (2 wheels) Gait Pattern/deviations: Step-through pattern, Decreased step length - right, Decreased step length - left Gait velocity: decr     General Gait Details: decreased R knee flexion during ambulation.  Stairs            Wheelchair Mobility     Tilt Bed    Modified Rankin (Stroke Patients Only)       Balance Overall balance assessment: Modified Independent                                           Pertinent Vitals/Pain Pain Assessment Pain Assessment: No/denies pain    Home Living Family/patient expects to be discharged to:: Private residence Living Arrangements: Spouse/significant other Available Help at Discharge: Family;Available 24 hours/day Type of Home: House Home Access: Ramped entrance       Home Layout: One level;Laundry or work area in [employer] Equipment: Agricultural consultant (2 wheels);Cane - single point      Prior Function Prior Level of Function : Independent/Modified Independent  Mobility Comments: Has been working on progressing back to [facility]. Has not been driving since surgery 3 months ago ADLs Comments: mod I     Extremity/Trunk Assessment   Upper Extremity Assessment Upper Extremity Assessment: Overall WFL for tasks assessed    Lower Extremity Assessment Lower Extremity Assessment: Generalized weakness       Communication   Communication Communication: Impaired Factors Affecting Communication: Reduced clarity of speech    Cognition Arousal: Alert Behavior During  Therapy: WFL for tasks assessed/performed   PT - Cognitive impairments: No apparent impairments                         Following commands: Intact       Cueing Cueing Techniques: Verbal cues     General Comments      Exercises     Assessment/Plan    PT Assessment Patient needs continued PT services  PT Problem List Decreased strength;Decreased range of motion;Decreased activity tolerance;Decreased balance;Decreased mobility       PT Treatment Interventions DME instruction;Gait training;Functional mobility training;Therapeutic activities;Therapeutic exercise;Balance training;Neuromuscular re-education;Patient/family education    PT Goals (Current goals can be found in the Care Plan section)  Acute Rehab PT Goals Patient Stated Goal: return home, resume Outpatient PT PT Goal Formulation: With patient/family Time For Goal Achievement: 02/29/24 Potential to Achieve Goals: Good    Frequency Min 2X/week     Co-evaluation               AM-PAC PT 6 Clicks Mobility  Outcome Measure Help needed turning from your back to your side while in a flat bed without using bedrails?: None Help needed moving from lying on your back to sitting on the side of a flat bed without using bedrails?: None Help needed moving to and from a bed to a chair (including a wheelchair)?: A Little Help needed standing up from a chair using your arms (e.g., wheelchair or bedside chair)?: A Little Help needed to walk in hospital room?: A Little Help needed climbing 3-5 steps with a railing? : A Little 6 Click Score: 20    End of Session   Activity Tolerance: Patient tolerated treatment well Patient left: in chair;with call bell/phone within reach;with family/visitor present Nurse Communication: Mobility status PT Visit Diagnosis: Other abnormalities of gait and mobility (R26.89);Muscle weakness (generalized) (M62.81);Difficulty in walking, not elsewhere classified (R26.2)    Time:  2130-8657 PT Time Calculation (min) (ACUTE ONLY): 19 min   Charges:   PT Evaluation $PT Eval Low Complexity: 1 Low PT Treatments $Gait Training: 8-22 mins PT General Charges $$ ACUTE PT VISIT: 1 Visit        Adalyna Godbee, PT, GCS 02/22/24,10:21 AM

## 2024-02-22 NOTE — TOC Initial Note (Addendum)
 Transition of Care (TOC) - Initial/Assessment Note    Patient Details  Name: Jason Hoback Macwilliams Sr. MRN: [id number] Date of Birth: 1951/04/29  Transition of Care [facility]) CM/SW Contact:    Jeani Mill, RN Phone Number: 02/22/2024, 4:08 PM  Clinical Narrative:                  Spoke to patient and wife regarding transition needs.  Patient lives with wife and has all needed DME. Sent referral to [facility] center for OP [phone] Address, Phone number and PCP verified. TOC will continue to follow for needs.  Expected Discharge Plan: OP Rehab Barriers to Discharge: Continued Medical Work up   Patient Goals and CMS Choice Patient states their goals for this hospitalization and ongoing recovery are:: Return home CMS Medicare.gov Compare Post Acute Care list provided to:: Patient Choice offered to / list presented to : Patient      Expected Discharge Plan and Services   Discharge Planning Services: CM Consult   Living arrangements for the past 2 months: Single Family Home                                      Prior Living Arrangements/Services Living arrangements for the past 2 months: Single Family Home Lives with:: Spouse Patient language and need for interpreter reviewed:: Yes Do you feel safe going back to the place where you live?: Yes      Need for Family Participation in Patient Care: Yes (Comment) Care giver support system in place?: Yes (comment) Current home services: DME (walker, BSC, cane) Criminal Activity/Legal Involvement Pertinent to Current Situation/Hospitalization: No - Comment as needed  Activities of Daily Living   ADL Screening (condition at time of admission) Independently performs ADLs?: Yes (appropriate for developmental age) Is the patient deaf or have difficulty hearing?: No Does the patient have difficulty seeing, even when wearing glasses/contacts?: No Does the patient have difficulty concentrating, remembering, or making  decisions?: No  Permission Sought/Granted                  Emotional Assessment Appearance:: Appears stated age Attitude/Demeanor/Rapport: Engaged Affect (typically observed): Accepting Orientation: : Oriented to Self, Oriented to Place, Oriented to  Time, Oriented to Situation Alcohol / Substance Use: Not Applicable Psych Involvement: No (comment)  Admission diagnosis:  Acute on chronic diastolic heart failure (HCC) [I50.33] SOB (shortness of breath) [R06.02] Patient Active Problem List   Diagnosis Date Noted   Acute on chronic diastolic heart failure (HCC) 02/21/2024   PICC (peripherally inserted central catheter) in place 01/21/2022   Medication monitoring encounter 01/21/2022   Bradycardia 01/14/2022   Pacemaker pocket hematoma 01/12/2022   Impaired functional mobility, balance, gait, and endurance 01/12/2022   Bleeding from wound 01/12/2022   Wound disruption, post-op, skin, initial encounter    Chronic systolic CHF (congestive heart failure) (HCC) 12/29/2021   Bacteremia due to Enterococcus    Coronary artery disease 12/22/2021   Deep vein thrombosis (DVT) of distal vein of left lower extremity of indeterminate age (HCC) 12/22/2021   Chronic obstructive pulmonary disease (HCC) 10/14/2021   Hammertoes of both feet 03/20/2021   Paroxysmal atrial fibrillation (HCC) 08/25/2019   GERD (gastroesophageal reflux disease) 04/11/2018   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 [place]  - [address] [place] [address] Phone: [phone] Fax: [phone]     Social Drivers of Health (SDOH) Social History: SDOH Screenings   Food Insecurity: No Food Insecurity (12/30/2023)   Received from [facility]  Housing: Low Risk  (12/30/2023)   Received from [facility]  Transportation Needs: No Transportation Needs (12/30/2023)   Received from [facility]  Utilities: Not At Risk (12/30/2023)   Received from [facility]  Financial Resource Strain: Low Risk  (12/30/2023)   Received from [facility]  Physical Activity: Unknown (12/30/2023)   Received from [facility]  Social Connections: Moderately Integrated (12/30/2023)   Received from [facility]  Stress: No Stress Concern Present (01/19/2024)   Received from [facility]  Tobacco Use: Low Risk  (02/20/2024)   SDOH Interventions:     Readmission Risk Interventions     No data to display

## 2024-02-26 LAB — CULTURE, BLOOD (ROUTINE X 2)
Culture: NO GROWTH
Culture: NO GROWTH
Special Requests: ADEQUATE
Special Requests: ADEQUATE

## 2024-03-09 NOTE — Progress Notes (Unsigned)
  Electrophysiology Office Note:   Date:  03/10/2024  ID:  Jason ORN Adamek Sr., DOB [birth year], MRN [id number]  Primary Cardiologist: None Primary Heart Failure: None Electrophysiologist: Danelle Birmingham, MD      History of Present Illness:   Jason Marineau Vrooman Sr. is a 73 y.o. male with h/o SND s/p PPM insertion x2 > s/p extraction 2023 for infection  seen today for post hospital follow up.    Admitted 6/14-6/16/25 with acute on chronic diastolic HF (shortness of breath, LE swelling, orthopnea, dry cough).  CTA chest was negative for PE. ECHO showed G1DD.  He was treated as CHF / possible COPD exacerbation.  BUN rose with diuresis.  He was discharged on 5 day prednisone course.   Since discharge from hospital the patient reports doing very well since discharge. He feels like he is back to normal. 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 No swelling or shortness of breath. His PCP stopped his doxazosin  for low HR.    He denies chest pain, palpitations, dyspnea, PND, orthopnea, nausea, vomiting, dizziness, syncope, edema, weight gain, or early satiety.   Review of systems complete and found to be negative unless listed in HPI.   EP Information / Studies Reviewed:    EKG is not ordered today. EKG from 02/20/24 reviewed which showed SR with PAC's, RBBB, LAFB      Studies:  TEE 12/2021 > LVEF 60-65% TTE 02/22/24 > LVEF 70-75%, hyperdynamic LV, no RWMA, G1DD, LA severely dilated   Arrhythmia / AAD SND  Paroxysmal AF   Risk Assessment/Calculations:    CHA2DS2-VASc Score = 7   This indicates a 11.2% annual risk of stroke. The patient's score is based upon: CHF History: 0 HTN History: 1 Diabetes History: 1 Stroke History: 2 Vascular Disease History: 1 Age Score: 2 Gender Score: 0             Physical Exam:   VS:  BP 130/80   Pulse (!) 55   Ht 5' 9 (1.753 m)   Wt 222 lb 8 oz (100.9 kg)   SpO2 95%   BMI 32.86 kg/m    Wt Readings from Last 3 Encounters:  03/10/24 222  lb 8 oz (100.9 kg)  02/22/24 207 lb 3.7 oz (94 kg)  11/01/23 213 lb (96.6 kg)     GEN: elderly adult male, well nourished, well developed in no acute distress NECK: No JVD; No carotid bruits CARDIAC: Regular rate and rhythm, no murmurs, rubs, gallops RESPIRATORY:  Clear to auscultation without rales, wheezing or rhonchi  ABDOMEN: Soft, non-tender, non-distended EXTREMITIES:  No edema; No deformity   ASSESSMENT AND PLAN:     Decompensated Diastolic Heart Failure  Recent admit, treated as CHF, +/- COPD exacerbation  -euvolemic on exam  -repeat labs > BMP, CBC post admit  -completed prednisone dosing  -lasix  40 mg BID -continue norvasc , cozaar    SND with hx of PPM s/p Removal due to Infection  -no current symptoms   Paroxysmal Atrial Fibrillation  CHA2DS2-VASc 7 -OAC for stroke prophylaxis    Secondary Hypercoagulable State  -continue Eliquis  5mg  BID, dose reviewed and appropriate by age / wt   Hypertension  -well controlled on current regimen   -doxazosin  stopped per PCP, agree with holding   Follow up with Dr. Birmingham in 6 months  Signed, Daphne Barrack, NP-C, AGACNP-BC [facility] - Electrophysiology  03/10/2024, 11:22 AM

## 2024-03-10 ENCOUNTER — Ambulatory Visit: Attending: Pulmonary Disease | Admitting: Pulmonary Disease

## 2024-03-10 ENCOUNTER — Encounter: Payer: Self-pay | Admitting: Pulmonary Disease

## 2024-03-10 VITALS — BP 130/80 | HR 55 | Ht 69.0 in | Wt 222.5 lb

## 2024-03-10 DIAGNOSIS — I10 Essential (primary) hypertension: Secondary | ICD-10-CM

## 2024-03-10 DIAGNOSIS — I480 Paroxysmal atrial fibrillation: Secondary | ICD-10-CM

## 2024-03-10 DIAGNOSIS — D6869 Other thrombophilia: Secondary | ICD-10-CM

## 2024-03-10 DIAGNOSIS — I495 Sick sinus syndrome: Secondary | ICD-10-CM

## 2024-03-10 DIAGNOSIS — I5033 Acute on chronic diastolic (congestive) heart failure: Secondary | ICD-10-CM | POA: Diagnosis not present

## 2024-03-10 NOTE — Patient Instructions (Signed)
 Lab Work: TODAY CBC, BMET If you have labs (blood work) drawn today and your tests are completely normal, you will receive your results only by: MyChart Message (if you have MyChart) OR A paper copy in the mail If you have any lab test that is abnormal or we need to change your treatment, we will call you to review the results.  Follow-Up: At [facility], you and your health needs are our priority.  As part of our continuing mission to provide you with exceptional heart care, our providers are all part of one team.  This team includes your primary Cardiologist (physician) and Advanced Practice Providers or APPs (Physician Assistants and Nurse Practitioners) who all work together to provide you with the care you need, when you need it.  Your next appointment:   6 month(s)  Provider:   Danelle Birmingham, MD or Daphne Barrack, NP    We recommend signing up for the patient portal called MyChart.  Sign up information is provided on this After Visit Summary.  MyChart is used to connect with patients for Virtual Visits (Telemedicine).  Patients are able to view lab/test results, encounter notes, upcoming appointments, etc.  Non-urgent messages can be sent to your provider as well.   To learn more about what you can do with MyChart, go to [email or web address].

## 2024-03-11 ENCOUNTER — Ambulatory Visit: Payer: Self-pay | Admitting: Pulmonary Disease

## 2024-03-11 LAB — CBC
Hematocrit: 36.1 % — ABNORMAL LOW (ref 37.5–51.0)
Hemoglobin: 10.6 g/dL — ABNORMAL LOW (ref 13.0–17.7)
MCH: 24.4 pg — ABNORMAL LOW (ref 26.6–33.0)
MCHC: 29.4 g/dL — ABNORMAL LOW (ref 31.5–35.7)
MCV: 83 fL (ref 79–97)
Platelets: 227 x10E3/uL (ref 150–450)
RBC: 4.35 x10E6/uL (ref 4.14–5.80)
RDW: 14.7 % (ref 11.6–15.4)
WBC: 8.2 x10E3/uL (ref 3.4–10.8)

## 2024-03-11 LAB — BASIC METABOLIC PANEL WITH GFR
BUN/Creatinine Ratio: 13 (ref 10–24)
BUN: 15 mg/dL (ref 8–27)
CO2: 26 mmol/L (ref 20–29)
Calcium: 9.2 mg/dL (ref 8.6–10.2)
Chloride: 105 mmol/L (ref 96–106)
Creatinine, Ser: 1.16 mg/dL (ref 0.76–1.27)
Glucose: 75 mg/dL (ref 70–99)
Potassium: 4.6 mmol/L (ref 3.5–5.2)
Sodium: 145 mmol/L — ABNORMAL HIGH (ref 134–144)
eGFR: 66 mL/min/1.73 (ref >59)

## 2024-04-05 IMAGING — US IR PICC >5YO
1 series · 4 of 4 positions shown · IV contrast (agent unspecified)
Comparison: none

INDICATION: Pacemaker lead vegetation, in need of long-term IV antibiotic
treatment. Request for PICC line placement.

EXAM:
ULTRASOUND AND FLUOROSCOPIC GUIDED PICC LINE INSERTION
MEDICATIONS:
1% lidocaine
CONTRAST:  None
FLUOROSCOPY TIME:  6 seconds (1 mGy)
COMPLICATIONS:
None immediate.
TECHNIQUE: The procedure, risks, benefits, and alternatives were explained to
the patient and/or patient's representative and informed written
consent was obtained.

[Series 1: ir picc >5yo · 4 of 4 slices shown]
[im 1/4]
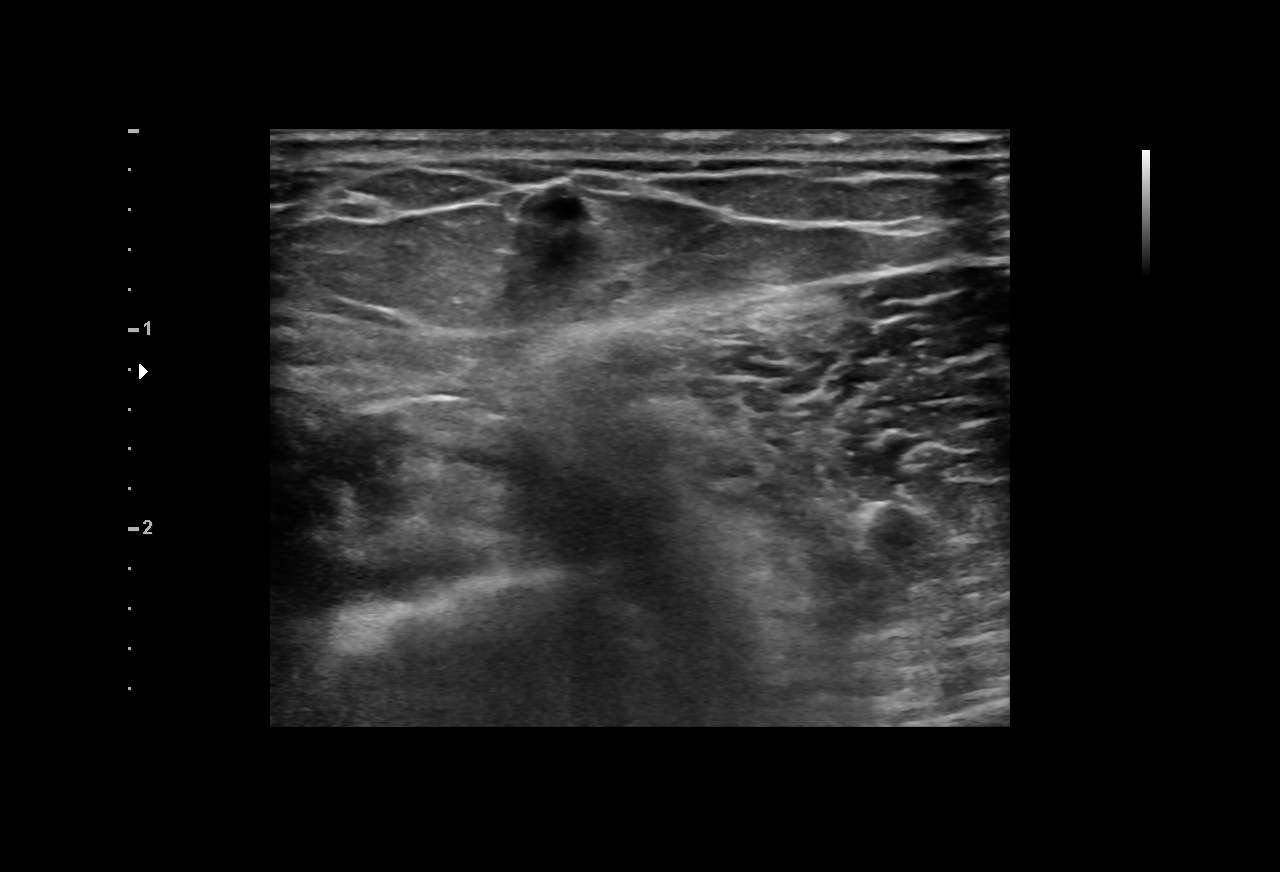
[im 2/4]
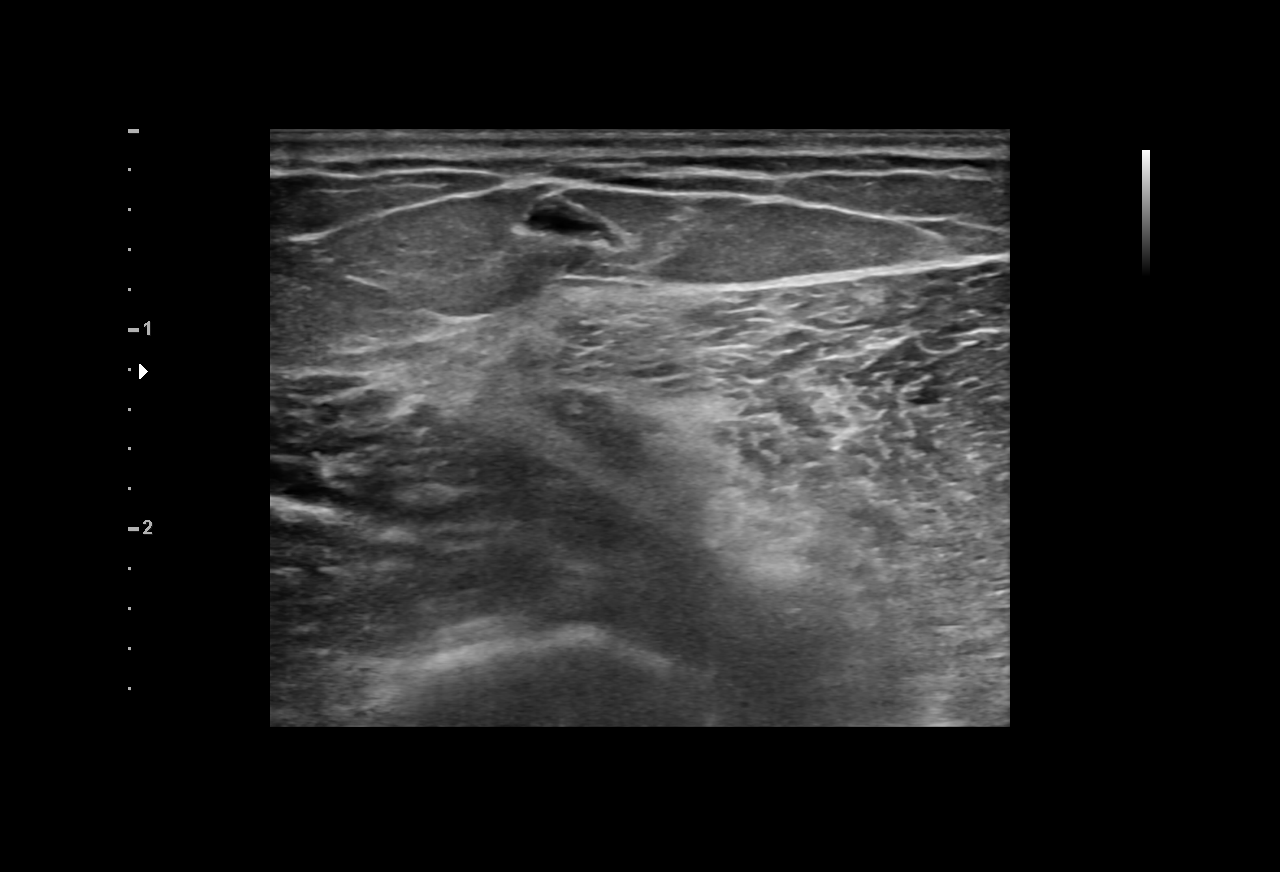
[im 3/4]
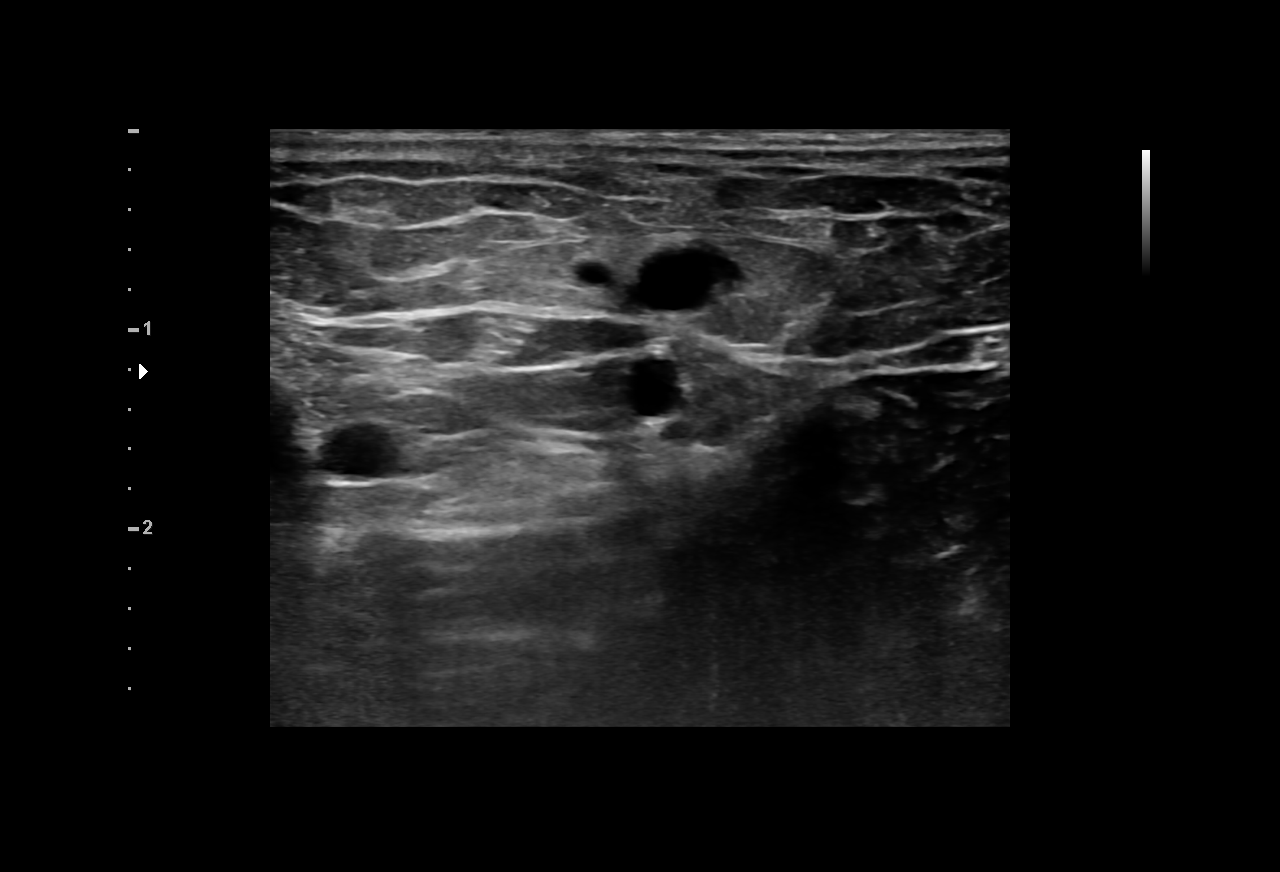
[im 4/4]
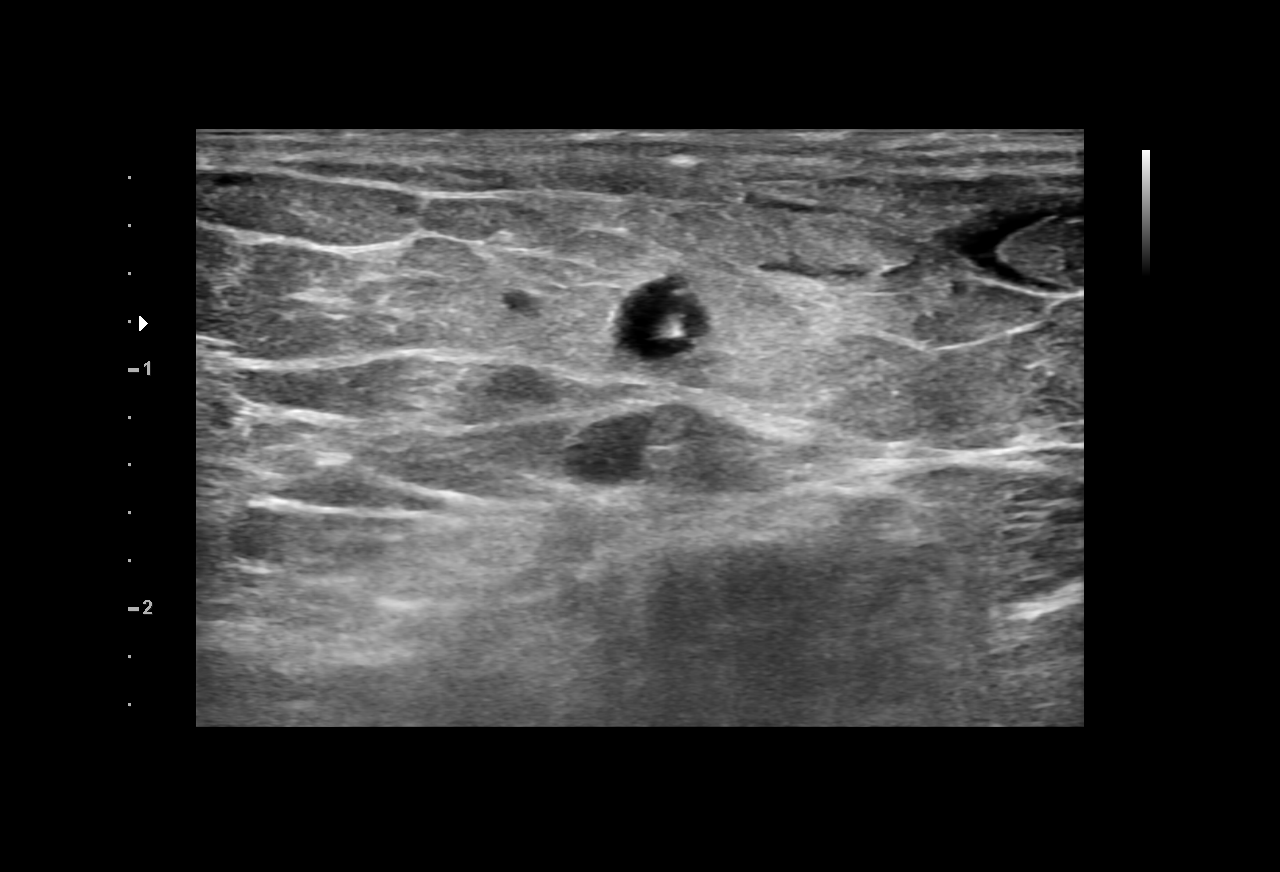

[4 of 4 positions shown; findings below may reference images not displayed]

The RIGHT upper extremity was prepped with chlorhexidine in a
sterile fashion, and a sterile drape was applied covering the
operative field. Maximum barrier sterile technique with sterile
gowns and gloves were used for the procedure. A timeout was
performed prior to the initiation of the procedure. Local anesthesia
was provided with 1% lidocaine.

After the overlying soft tissues were anesthetized with 1%
lidocaine, a micropuncture kit was utilized to access the RIGHT
basilic vein. Real-time ultrasound guidance was utilized for
vascular access including the acquisition of a permanent ultrasound
image documenting patency of the accessed vessel.

A guidewire was advanced to the level of the superior caval-atrial
junction for measurement purposes and the PICC line was cut to
length. A peel-away sheath was placed and a 36 cm, 5 French, dual
lumen was inserted to level of the superior caval-atrial junction. A
post procedure spot fluoroscopic was obtained. The catheter easily
aspirated and flushed and was secured in place. A dressing was
placed. The patient tolerated the procedure well without immediate
post procedural complication.
FINDINGS: After catheter placement, the tip lies within the superior
cavoatrial junction. The catheter aspirates and flushes normally and
is ready for immediate use.
IMPRESSION: Successful placement of a RIGHT basilic vein approach, 36 cm, 5
French, dual lumen PICC

The tip of the catheter is positioned within the proximal RIGHT
atrium. The PICC line is ready for immediate use.

## 2024-06-15 ENCOUNTER — Other Ambulatory Visit: Payer: Self-pay | Admitting: Interventional Radiology

## 2024-06-15 DIAGNOSIS — N2889 Other specified disorders of kidney and ureter: Secondary | ICD-10-CM

## 2024-08-21 ENCOUNTER — Other Ambulatory Visit: Payer: Self-pay

## 2024-08-21 ENCOUNTER — Emergency Department (HOSPITAL_BASED_OUTPATIENT_CLINIC_OR_DEPARTMENT_OTHER)
Admission: EM | Admit: 2024-08-21 | Discharge: 2024-08-21 | Disposition: A | Discharge status: Home / Self Care | Attending: Emergency Medicine | Admitting: Emergency Medicine

## 2024-08-21 ENCOUNTER — Encounter (HOSPITAL_BASED_OUTPATIENT_CLINIC_OR_DEPARTMENT_OTHER): Payer: Self-pay | Admitting: Emergency Medicine

## 2024-08-21 ENCOUNTER — Emergency Department (HOSPITAL_BASED_OUTPATIENT_CLINIC_OR_DEPARTMENT_OTHER)

## 2024-08-21 DIAGNOSIS — M7918 Myalgia, other site: Secondary | ICD-10-CM

## 2024-08-21 DIAGNOSIS — W19XXXA Unspecified fall, initial encounter: Secondary | ICD-10-CM

## 2024-08-21 MED ORDER — LIDOCAINE 5 % EX PTCH
1.0000 | MEDICATED_PATCH | CUTANEOUS | Status: DC
  Administered 2024-08-21: 1 via TRANSDERMAL
  Filled 2024-08-21: qty 1

## 2024-08-21 NOTE — Discharge Instructions (Addendum)
 Today you were seen for right hip and knee pain after a fall.  Your workup on the emergency department was reassuring.  You may alternate Tylenol  Motrin and apply ice or heat to the affected area.  Please follow-up with your PCP or orthopedics if your symptoms persist for further evaluation workup.  Thank you for letting us  treat you today. After reviewing your imaging, I feel you are safe to go home. Please follow up with your PCP in the next several days and provide them with your records from this visit. Return to the Emergency Room if pain becomes severe or symptoms worsen.

## 2024-08-21 NOTE — ED Notes (Signed)
 Pt unable to provide urine sample at this time. Urinal at bedside, will continue to encourage.

## 2024-08-21 NOTE — ED Triage Notes (Signed)
 Right hip pain , multiple falls , last fall last night , Hx surgery same limb 11/2023.  Also reports right knee pain .  No head injury or LOC .  On Eliquis  .

## 2024-08-21 NOTE — ED Provider Notes (Signed)
 [facility] Provider Note   CSN: [id number] Arrival date & time: 08/21/24  1621     Patient presents with: Jason Clem [name] is a 73 y.o. male.  Presents today for right hip pain.  Patient has had multiple falls over the last week, most recently last night.  Patient and wife deny head injury or LOC.  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  Patient also reporting right knee pain.  Patient ambulatory with cane.  Patient anticoagulated on Eliquis .   HPI     Prior to Admission medications  Medication Sig Start Date End Date Taking? Authorizing Provider  acetaminophen  (TYLENOL ) 500 MG tablet Take 1,000 mg by mouth every 8 (eight) hours as needed for moderate pain (pain score 4-6).    [provider]  albuterol  (VENTOLIN  HFA) 108 (90 Base) MCG/ACT inhaler Inhale 1 puff into the lungs every 6 (six) hours as needed for wheezing or shortness of breath.    [provider]  amLODipine  (NORVASC ) 5 MG tablet Take 1 tablet (5 mg total) by mouth daily. 10/12/23   Aniceto Daphne CROME, NP  apixaban  (ELIQUIS ) 5 MG TABS tablet Take 1 tablet (5 mg total) by mouth 2 (two) times daily. 10/12/23   Aniceto Daphne CROME, NP  atorvastatin  (LIPITOR) 40 MG tablet Take 40 mg by mouth every evening.    [provider]  BREO ELLIPTA 200-25 MCG/ACT AEPB Inhale 1 puff into the lungs daily. 12/28/23   [provider]  carisoprodol (SOMA) 350 MG tablet Take 350 mg by mouth at bedtime as needed for muscle spasms.    [provider]  escitalopram (LEXAPRO) 20 MG tablet Take 20 mg by mouth daily.    [provider]  furosemide  (LASIX ) 40 MG tablet Take 40 mg by mouth 2 (two) times daily.    [provider]  gabapentin (NEURONTIN) 600 MG tablet Take 600-1,200 mg by mouth See admin instructions. Take 600mg  by mouth in the morning and 1200mg  by mouth in the evening.    [provider]  IFEREX 150 150  MG capsule Take 300 mg by mouth at bedtime. 10/07/21   [provider]  insulin  NPH-regular Human (70-30) 100 UNIT/ML injection Inject 20-30 Units into the skin See admin instructions. Inject 30 units subcutaneously in the morning and 20 units at night.    [provider]  losartan  (COZAAR ) 100 MG tablet Take 1 tablet (100 mg total) by mouth daily. 01/14/22   Macario Dorothyann HERO, MD  Multiple Vitamins-Minerals (CENTRUM SILVER ADULT 50+ PO) Take 1 tablet by mouth daily.    [provider]  nitroGLYCERIN  (NITROSTAT ) 0.4 MG SL tablet Place 0.4 mg under the tongue every 5 (five) minutes as needed for chest pain.    [provider]  pantoprazole  (PROTONIX ) 40 MG tablet Take 40 mg by mouth 2 (two) times daily. 11/30/21   [provider]  potassium chloride  (K-DUR) 10 MEQ tablet Take 30 mEq by mouth daily.    [provider]  pramipexole (MIRAPEX) 0.25 MG tablet Take 0.25 mg by mouth at bedtime. 12/28/23   [provider]  triamcinolone cream (KENALOG) 0.1 % Apply 1 application. topically 2 (two) times daily as needed (itching). 12/14/21   [provider]  Vitamin D, Ergocalciferol, (DRISDOL) 1.25 MG (50000 UNIT) CAPS capsule Take 50,000 Units by mouth once a week. On Wednesday.    [provider]    Allergies:  Ativan  [lorazepam ], Keflex [cephalexin], Ace inhibitors, Prednisone , and Sulfa antibiotics    Review of Systems  Musculoskeletal:  Positive for arthralgias.    Updated Vital Signs BP (!) 198/83   Pulse (!) 47   Temp 98.4 F (36.9 C)   Resp 16   Wt 100.7 kg   SpO2 96%   BMI 32.78 kg/m   Physical Exam Vitals and nursing note reviewed.  Constitutional:      General: He is not in acute distress.    Appearance: He is well-developed. He is not toxic-appearing.  HENT:     Head: Normocephalic and atraumatic.     Right Ear: External ear normal.     Left Ear: External ear normal.  Eyes:     Conjunctiva/sclera:  Conjunctivae normal.  Cardiovascular:     Rate and Rhythm: Normal rate and regular rhythm.     Pulses: Normal pulses.     Heart sounds: Normal heart sounds. No murmur heard. Pulmonary:     Effort: Pulmonary effort is normal. No respiratory distress.     Breath sounds: Normal breath sounds.  Abdominal:     Palpations: Abdomen is soft.     Tenderness: There is no abdominal tenderness.  Musculoskeletal:        General: Tenderness present. No swelling or deformity.     Cervical back: Neck supple.     Comments: Patient with minimal tenderness to palpation of right knee and right hip without ecchymosis or obvious deformity.  Patient neurovascularly intact with +2 dorsalis pedis pulses.  Skin:    General: Skin is warm and dry.     Capillary Refill: Capillary refill takes less than 2 seconds.  Neurological:     General: No focal deficit present.     Mental Status: He is alert and oriented to person, place, and time.  Psychiatric:        Mood and Affect: Mood normal.     (all labs ordered are listed, but only abnormal results are displayed) Labs Reviewed - No data to display  EKG: None  Radiology: CT Hip Right Wo Contrast Result Date: 08/21/2024 EXAM: CT OF THE RIGHT HIP WITHOUT IV CONTRAST 08/21/2024 06:28:00 PM TECHNIQUE: CT of the right hip was performed without the administration of intravenous contrast. Multiplanar reformatted images are provided for review. Automated exposure control, iterative reconstruction, and/or weight based adjustment of the mA/kV was utilized to reduce the radiation dose to as low as reasonably achievable. COMPARISON: Right hip radiographs earlier today. CLINICAL HISTORY: Hip replacement, periprosthetic fracture suspected. FINDINGS: BONES: Right hip arthroplasty, without evidence of complication. No periprosthetic fracture. The visualized right pelvis is intact. No aggressive appearing osseous abnormality or periostitis. SOFT TISSUE: No significant soft tissue  edema or fluid collections. No soft tissue mass. JOINT: No significant degenerative changes. No osseous erosions. INTRAPELVIC CONTENTS: Limited images of the intrapelvic contents are unremarkable. IMPRESSION: 1. Right hip arthroplasty, without evidence of complication. 2. No fracture or dislocation. Electronically signed by: Pinkie Pebbles MD 08/21/2024 06:41 PM EST RP Workstation: [id number]   DG Hip Unilat W or Wo Pelvis 2-3 Views Right Result Date: 08/21/2024 CLINICAL DATA:  fall EXAM: RIGHT FEMUR 2 VIEWS; DG HIP (WITH OR WITHOUT PELVIS) 2-3V RIGHT; RIGHT KNEE - COMPLETE 4+ VIEW COMPARISON:  April 28, 2023. FINDINGS: Status post RIGHT hip arthroplasty. Orthopedic hardware is intact and without periprosthetic fracture or lucency. Similar sclerotic focus of the RIGHT sacrum, likely a bone island. Osteopenia. No acute fracture or dislocation. Moderate joint space narrowing and  osteophyte formation of the medial compartment and patellofemoral compartment. Enthesophyte of the patellar tendon. Mild joint space narrowing of the lateral compartment. Vascular calcifications. IMPRESSION: 1. No acute fracture or dislocation. 2. Status post RIGHT hip arthroplasty without evidence of hardware complication. 3. If there is a persistent clinical concern for nondisplaced hip or pelvic fracture, recommend dedicated pelvic CT or MRI. Electronically Signed   By: Corean Salter M.D.   On: 08/21/2024 17:23   DG Femur Min 2 Views Right Result Date: 08/21/2024 CLINICAL DATA:  fall EXAM: RIGHT FEMUR 2 VIEWS; DG HIP (WITH OR WITHOUT PELVIS) 2-3V RIGHT; RIGHT KNEE - COMPLETE 4+ VIEW COMPARISON:  April 28, 2023. FINDINGS: Status post RIGHT hip arthroplasty. Orthopedic hardware is intact and without periprosthetic fracture or lucency. Similar sclerotic focus of the RIGHT sacrum, likely a bone island. Osteopenia. No acute fracture or dislocation. Moderate joint space narrowing and osteophyte formation of the medial compartment  and patellofemoral compartment. Enthesophyte of the patellar tendon. Mild joint space narrowing of the lateral compartment. Vascular calcifications. IMPRESSION: 1. No acute fracture or dislocation. 2. Status post RIGHT hip arthroplasty without evidence of hardware complication. 3. If there is a persistent clinical concern for nondisplaced hip or pelvic fracture, recommend dedicated pelvic CT or MRI. Electronically Signed   By: Corean Salter M.D.   On: 08/21/2024 17:23   DG Knee Complete 4 Views Right Result Date: 08/21/2024 CLINICAL DATA:  fall EXAM: RIGHT FEMUR 2 VIEWS; DG HIP (WITH OR WITHOUT PELVIS) 2-3V RIGHT; RIGHT KNEE - COMPLETE 4+ VIEW COMPARISON:  April 28, 2023. FINDINGS: Status post RIGHT hip arthroplasty. Orthopedic hardware is intact and without periprosthetic fracture or lucency. Similar sclerotic focus of the RIGHT sacrum, likely a bone island. Osteopenia. No acute fracture or dislocation. Moderate joint space narrowing and osteophyte formation of the medial compartment and patellofemoral compartment. Enthesophyte of the patellar tendon. Mild joint space narrowing of the lateral compartment. Vascular calcifications. IMPRESSION: 1. No acute fracture or dislocation. 2. Status post RIGHT hip arthroplasty without evidence of hardware complication. 3. If there is a persistent clinical concern for nondisplaced hip or pelvic fracture, recommend dedicated pelvic CT or MRI. Electronically Signed   By: Corean Salter M.D.   On: 08/21/2024 17:23     Procedures   Medications Ordered in the ED  lidocaine  (LIDODERM ) 5 % 1 patch (has no administration in time range)                                    Medical Decision Making Amount and/or Complexity of Data Reviewed Labs: ordered. Radiology: ordered.   This patient presents to the ED for concern of Fall differential diagnosis includes fracture, dislocation, musculoskeletal pain   Imaging Studies ordered:  I ordered imaging  studies including right hip, femur, and knee x-rays I independently visualized and interpreted imaging which showed no acute fracture or dislocation of the right hip, femur, or right knee. I agree with the radiologist interpretation EKG: Sinus bradycardia CT right hip Noncon: Right hip arthroplasty without evidence of complication.  No fracture or dislocation   Medicines ordered and prescription drug management:  I ordered medication including lidoderm  patch    I have reviewed the patients home medicines and have made adjustments as needed   Problem List / ED Course:  Considered for admission or further workup however patient's vital signs, physical exam, and imaging are reassuring. Patient symptoms likely due to musculoskeletal  pain.  Patient advised to alternate Tylenol /Motrin as needed for pain.  Patient may also ice affected area.  Patient given return precautions.  I feel patient safe for discharge at this time.     Final diagnoses:  Fall, initial encounter  Musculoskeletal pain    ED Discharge Orders     None          Francis Ileana SAILOR, PA-C 08/21/24 1958    Elnor Bernarda SQUIBB, DO 08/23/24 1421
